# Patient Record
Sex: Female | Born: 1935 | Race: White | Hispanic: No | Smoking: Never smoker
Health system: Southern US, Community
[De-identification: ages and names within clinical notes are randomized; demographics above are authoritative.]

## PROBLEM LIST (undated history)

## (undated) DIAGNOSIS — F0390 Unspecified dementia without behavioral disturbance: Secondary | ICD-10-CM

## (undated) DIAGNOSIS — D510 Vitamin B12 deficiency anemia due to intrinsic factor deficiency: Secondary | ICD-10-CM

## (undated) DIAGNOSIS — N189 Chronic kidney disease, unspecified: Secondary | ICD-10-CM

## (undated) DIAGNOSIS — I10 Essential (primary) hypertension: Secondary | ICD-10-CM

## (undated) DIAGNOSIS — G4733 Obstructive sleep apnea (adult) (pediatric): Secondary | ICD-10-CM

## (undated) DIAGNOSIS — M1990 Unspecified osteoarthritis, unspecified site: Secondary | ICD-10-CM

## (undated) DIAGNOSIS — E7800 Pure hypercholesterolemia, unspecified: Secondary | ICD-10-CM

## (undated) HISTORY — PX: OTHER SURGICAL HISTORY: SHX169

---

## 2005-08-24 ENCOUNTER — Emergency Department: Payer: Self-pay | Admitting: General Practice

## 2005-10-28 ENCOUNTER — Ambulatory Visit: Payer: Self-pay | Admitting: Internal Medicine

## 2006-03-03 ENCOUNTER — Ambulatory Visit: Payer: Self-pay | Admitting: Unknown Physician Specialty

## 2006-04-07 ENCOUNTER — Ambulatory Visit: Payer: Self-pay | Admitting: Pain Medicine

## 2006-04-11 ENCOUNTER — Ambulatory Visit: Payer: Self-pay | Admitting: Pain Medicine

## 2006-05-24 ENCOUNTER — Ambulatory Visit: Payer: Self-pay | Admitting: Pain Medicine

## 2006-06-08 ENCOUNTER — Ambulatory Visit: Payer: Self-pay | Admitting: Pain Medicine

## 2006-08-16 ENCOUNTER — Ambulatory Visit: Payer: Self-pay | Admitting: Pain Medicine

## 2006-08-31 ENCOUNTER — Ambulatory Visit: Payer: Self-pay | Admitting: Pain Medicine

## 2006-10-04 ENCOUNTER — Ambulatory Visit: Payer: Self-pay | Admitting: Pain Medicine

## 2006-10-17 ENCOUNTER — Ambulatory Visit: Payer: Self-pay | Admitting: Pain Medicine

## 2011-09-07 ENCOUNTER — Ambulatory Visit: Payer: Self-pay | Admitting: Internal Medicine

## 2013-10-08 ENCOUNTER — Emergency Department: Payer: Self-pay | Admitting: Emergency Medicine

## 2013-10-08 LAB — CBC WITH DIFFERENTIAL/PLATELET
BASOS PCT: 0.4 %
Basophil #: 0 10*3/uL (ref 0.0–0.1)
EOS PCT: 1 %
Eosinophil #: 0.1 10*3/uL (ref 0.0–0.7)
HCT: 34.8 % — AB (ref 35.0–47.0)
HGB: 11.8 g/dL — ABNORMAL LOW (ref 12.0–16.0)
LYMPHS PCT: 14.6 %
Lymphocyte #: 1.2 10*3/uL (ref 1.0–3.6)
MCH: 31.6 pg (ref 26.0–34.0)
MCHC: 33.9 g/dL (ref 32.0–36.0)
MCV: 93 fL (ref 80–100)
Monocyte #: 0.5 x10 3/mm (ref 0.2–0.9)
Monocyte %: 6.4 %
NEUTROS ABS: 6.6 10*3/uL — AB (ref 1.4–6.5)
NEUTROS PCT: 77.6 %
PLATELETS: 173 10*3/uL (ref 150–440)
RBC: 3.73 10*6/uL — ABNORMAL LOW (ref 3.80–5.20)
RDW: 13 % (ref 11.5–14.5)
WBC: 8.5 10*3/uL (ref 3.6–11.0)

## 2013-10-08 LAB — BASIC METABOLIC PANEL
Anion Gap: 7 (ref 7–16)
BUN: 31 mg/dL — AB (ref 7–18)
Calcium, Total: 9.5 mg/dL (ref 8.5–10.1)
Chloride: 103 mmol/L (ref 98–107)
Co2: 26 mmol/L (ref 21–32)
Creatinine: 1.61 mg/dL — ABNORMAL HIGH (ref 0.60–1.30)
EGFR (African American): 32 — ABNORMAL LOW
EGFR (Non-African Amer.): 27 — ABNORMAL LOW
GLUCOSE: 128 mg/dL — AB (ref 65–99)
OSMOLALITY: 280 (ref 275–301)
Potassium: 4.4 mmol/L (ref 3.5–5.1)
SODIUM: 136 mmol/L (ref 136–145)

## 2013-10-08 LAB — TROPONIN I

## 2014-04-24 ENCOUNTER — Inpatient Hospital Stay: Payer: Self-pay | Admitting: Internal Medicine

## 2014-04-24 LAB — COMPREHENSIVE METABOLIC PANEL
AST: 54 U/L — AB (ref 15–37)
Albumin: 2.5 g/dL — ABNORMAL LOW (ref 3.4–5.0)
Alkaline Phosphatase: 145 U/L — ABNORMAL HIGH
Anion Gap: 12 (ref 7–16)
BILIRUBIN TOTAL: 0.4 mg/dL (ref 0.2–1.0)
BUN: 64 mg/dL — ABNORMAL HIGH (ref 7–18)
CHLORIDE: 104 mmol/L (ref 98–107)
CO2: 27 mmol/L (ref 21–32)
CREATININE: 1.91 mg/dL — AB (ref 0.60–1.30)
Calcium, Total: 8.7 mg/dL (ref 8.5–10.1)
EGFR (Non-African Amer.): 22 — ABNORMAL LOW
GFR CALC AF AMER: 26 — AB
Glucose: 142 mg/dL — ABNORMAL HIGH (ref 65–99)
Osmolality: 306 (ref 275–301)
Potassium: 3.3 mmol/L — ABNORMAL LOW (ref 3.5–5.1)
SGPT (ALT): 33 U/L
Sodium: 143 mmol/L (ref 136–145)
Total Protein: 6.8 g/dL (ref 6.4–8.2)

## 2014-04-24 LAB — URINALYSIS, COMPLETE
BACTERIA: NONE SEEN
BILIRUBIN, UR: NEGATIVE
Blood: NEGATIVE
Glucose,UR: NEGATIVE mg/dL (ref 0–75)
Hyaline Cast: 9
Ketone: NEGATIVE
LEUKOCYTE ESTERASE: NEGATIVE
Nitrite: NEGATIVE
Ph: 5 (ref 4.5–8.0)
Protein: 30
Specific Gravity: 1.02 (ref 1.003–1.030)
Squamous Epithelial: <1
WBC UR: 1 /HPF (ref 0–5)

## 2014-04-24 LAB — PROTIME-INR
INR: 1.1
Prothrombin Time: 14.4 secs (ref 11.5–14.7)

## 2014-04-24 LAB — CBC
HCT: 32.1 % — ABNORMAL LOW (ref 35.0–47.0)
HGB: 10.4 g/dL — ABNORMAL LOW (ref 12.0–16.0)
MCH: 30.2 pg (ref 26.0–34.0)
MCHC: 32.4 g/dL (ref 32.0–36.0)
MCV: 93 fL (ref 80–100)
PLATELETS: 239 10*3/uL (ref 150–440)
RBC: 3.44 10*6/uL — AB (ref 3.80–5.20)
RDW: 13.4 % (ref 11.5–14.5)
WBC: 12.8 10*3/uL — AB (ref 3.6–11.0)

## 2014-04-24 LAB — APTT: Activated PTT: 30.4 secs (ref 23.6–35.9)

## 2014-04-24 LAB — TROPONIN I: Troponin-I: <0.02 ng/mL

## 2014-04-24 LAB — MAGNESIUM: Magnesium: 1.7 mg/dL — ABNORMAL LOW

## 2014-04-25 LAB — LIPID PANEL
Cholesterol: 112 mg/dL (ref 0–200)
HDL Cholesterol: 17 mg/dL — ABNORMAL LOW (ref 40–60)
LDL CHOLESTEROL, CALC: 63 mg/dL (ref 0–100)
Triglycerides: 160 mg/dL (ref 0–200)
VLDL Cholesterol, Calc: 32 mg/dL (ref 5–40)

## 2014-04-25 LAB — CBC WITH DIFFERENTIAL/PLATELET
Basophil #: 0 10*3/uL (ref 0.0–0.1)
Basophil %: 0.3 %
EOS ABS: 0.2 10*3/uL (ref 0.0–0.7)
Eosinophil %: 2 %
HCT: 29.6 % — AB (ref 35.0–47.0)
HGB: 9.3 g/dL — ABNORMAL LOW (ref 12.0–16.0)
LYMPHS ABS: 1.5 10*3/uL (ref 1.0–3.6)
Lymphocyte %: 12.5 %
MCH: 29.1 pg (ref 26.0–34.0)
MCHC: 31.3 g/dL — AB (ref 32.0–36.0)
MCV: 93 fL (ref 80–100)
MONO ABS: 0.8 x10 3/mm (ref 0.2–0.9)
MONOS PCT: 7 %
Neutrophil #: 9.5 10*3/uL — ABNORMAL HIGH (ref 1.4–6.5)
Neutrophil %: 78.2 %
PLATELETS: 203 10*3/uL (ref 150–440)
RBC: 3.19 10*6/uL — ABNORMAL LOW (ref 3.80–5.20)
RDW: 13.2 % (ref 11.5–14.5)
WBC: 12.1 10*3/uL — AB (ref 3.6–11.0)

## 2014-04-25 LAB — BASIC METABOLIC PANEL
Anion Gap: 11 (ref 7–16)
BUN: 50 mg/dL — AB (ref 7–18)
CHLORIDE: 108 mmol/L — AB (ref 98–107)
CO2: 22 mmol/L (ref 21–32)
Calcium, Total: 8.1 mg/dL — ABNORMAL LOW (ref 8.5–10.1)
Creatinine: 1.63 mg/dL — ABNORMAL HIGH (ref 0.60–1.30)
EGFR (Non-African Amer.): 27 — ABNORMAL LOW
GFR CALC AF AMER: 31 — AB
Glucose: 105 mg/dL — ABNORMAL HIGH (ref 65–99)
OSMOLALITY: 295 (ref 275–301)
Potassium: 3.4 mmol/L — ABNORMAL LOW (ref 3.5–5.1)
SODIUM: 141 mmol/L (ref 136–145)

## 2014-04-25 LAB — MAGNESIUM: Magnesium: 1.4 mg/dL — ABNORMAL LOW

## 2014-04-25 LAB — TSH: THYROID STIMULATING HORM: 0.397 u[IU]/mL — AB

## 2014-04-26 LAB — BASIC METABOLIC PANEL
Anion Gap: 7 (ref 7–16)
BUN: 27 mg/dL — ABNORMAL HIGH (ref 7–18)
CALCIUM: 8.3 mg/dL — AB (ref 8.5–10.1)
CO2: 26 mmol/L (ref 21–32)
Chloride: 108 mmol/L — ABNORMAL HIGH (ref 98–107)
Creatinine: 1.24 mg/dL (ref 0.60–1.30)
EGFR (African American): 43 — ABNORMAL LOW
GFR CALC NON AF AMER: 37 — AB
Glucose: 118 mg/dL — ABNORMAL HIGH (ref 65–99)
Osmolality: 287 (ref 275–301)
Potassium: 3.7 mmol/L (ref 3.5–5.1)
Sodium: 141 mmol/L (ref 136–145)

## 2014-04-26 LAB — CBC WITH DIFFERENTIAL/PLATELET
BASOS ABS: 0 10*3/uL (ref 0.0–0.1)
Basophil %: 0.4 %
Eosinophil #: 0.2 10*3/uL (ref 0.0–0.7)
Eosinophil %: 2.3 %
HCT: 30 % — ABNORMAL LOW (ref 35.0–47.0)
HGB: 9.9 g/dL — AB (ref 12.0–16.0)
Lymphocyte #: 1.3 10*3/uL (ref 1.0–3.6)
Lymphocyte %: 12.6 %
MCH: 30.5 pg (ref 26.0–34.0)
MCHC: 33.1 g/dL (ref 32.0–36.0)
MCV: 92 fL (ref 80–100)
MONO ABS: 0.8 x10 3/mm (ref 0.2–0.9)
MONOS PCT: 7.8 %
NEUTROS ABS: 7.9 10*3/uL — AB (ref 1.4–6.5)
NEUTROS PCT: 76.9 %
Platelet: 186 10*3/uL (ref 150–440)
RBC: 3.25 10*6/uL — AB (ref 3.80–5.20)
RDW: 13.3 % (ref 11.5–14.5)
WBC: 10.3 10*3/uL (ref 3.6–11.0)

## 2014-04-27 LAB — BASIC METABOLIC PANEL
Anion Gap: 10 (ref 7–16)
BUN: 15 mg/dL (ref 7–18)
CHLORIDE: 105 mmol/L (ref 98–107)
CREATININE: 1.09 mg/dL (ref 0.60–1.30)
Calcium, Total: 8.2 mg/dL — ABNORMAL LOW (ref 8.5–10.1)
Co2: 26 mmol/L (ref 21–32)
EGFR (Non-African Amer.): 44 — ABNORMAL LOW
GFR CALC AF AMER: 51 — AB
Glucose: 108 mg/dL — ABNORMAL HIGH (ref 65–99)
OSMOLALITY: 283 (ref 275–301)
Potassium: 3.6 mmol/L (ref 3.5–5.1)
SODIUM: 141 mmol/L (ref 136–145)

## 2014-04-28 LAB — BASIC METABOLIC PANEL
ANION GAP: 7 (ref 7–16)
BUN: 12 mg/dL (ref 7–18)
CHLORIDE: 108 mmol/L — AB (ref 98–107)
CO2: 28 mmol/L (ref 21–32)
Calcium, Total: 8.1 mg/dL — ABNORMAL LOW (ref 8.5–10.1)
Creatinine: 1.12 mg/dL (ref 0.60–1.30)
EGFR (Non-African Amer.): 42 — ABNORMAL LOW
GFR CALC AF AMER: 49 — AB
GLUCOSE: 102 mg/dL — AB (ref 65–99)
Osmolality: 285 (ref 275–301)
Potassium: 3.9 mmol/L (ref 3.5–5.1)
Sodium: 143 mmol/L (ref 136–145)

## 2014-04-29 LAB — CULTURE, BLOOD (SINGLE)

## 2014-07-01 ENCOUNTER — Observation Stay: Payer: Self-pay | Admitting: Internal Medicine

## 2014-07-01 LAB — CBC WITH DIFFERENTIAL/PLATELET
Basophil #: 0.1 10*3/uL (ref 0.0–0.1)
Basophil %: 0.7 %
EOS PCT: 2.2 %
Eosinophil #: 0.2 10*3/uL (ref 0.0–0.7)
HCT: 31 % — ABNORMAL LOW (ref 35.0–47.0)
HGB: 10 g/dL — ABNORMAL LOW (ref 12.0–16.0)
Lymphocyte #: 1.8 10*3/uL (ref 1.0–3.6)
Lymphocyte %: 23.4 %
MCH: 28.9 pg (ref 26.0–34.0)
MCHC: 32.1 g/dL (ref 32.0–36.0)
MCV: 90 fL (ref 80–100)
MONOS PCT: 8 %
Monocyte #: 0.6 x10 3/mm (ref 0.2–0.9)
NEUTROS ABS: 5 10*3/uL (ref 1.4–6.5)
Neutrophil %: 65.7 %
PLATELETS: 211 10*3/uL (ref 150–440)
RBC: 3.45 10*6/uL — AB (ref 3.80–5.20)
RDW: 13.3 % (ref 11.5–14.5)
WBC: 7.5 10*3/uL (ref 3.6–11.0)

## 2014-07-01 LAB — URINALYSIS, COMPLETE
BILIRUBIN, UR: NEGATIVE
Glucose,UR: NEGATIVE mg/dL (ref 0–75)
Ketone: NEGATIVE
Nitrite: NEGATIVE
PH: 6 (ref 4.5–8.0)
Protein: NEGATIVE
RBC,UR: 105 /HPF (ref 0–5)
Specific Gravity: 1.013 (ref 1.003–1.030)
Squamous Epithelial: 11

## 2014-07-01 LAB — TROPONIN I
Troponin-I: 0.07 ng/mL — ABNORMAL HIGH
Troponin-I: 0.07 ng/mL — ABNORMAL HIGH
Troponin-I: 0.08 ng/mL — ABNORMAL HIGH

## 2014-07-01 LAB — CK-MB
CK-MB: 2.8 ng/mL (ref 0.5–3.6)
CK-MB: 2.9 ng/mL (ref 0.5–3.6)
CK-MB: 2.9 ng/mL (ref 0.5–3.6)

## 2014-07-01 LAB — BASIC METABOLIC PANEL
Anion Gap: 6 — ABNORMAL LOW (ref 7–16)
BUN: 24 mg/dL — ABNORMAL HIGH (ref 7–18)
CO2: 27 mmol/L (ref 21–32)
Calcium, Total: 8.6 mg/dL (ref 8.5–10.1)
Chloride: 107 mmol/L (ref 98–107)
Creatinine: 1.69 mg/dL — ABNORMAL HIGH (ref 0.60–1.30)
EGFR (Non-African Amer.): 30 — ABNORMAL LOW
GFR CALC AF AMER: 36 — AB
Glucose: 116 mg/dL — ABNORMAL HIGH (ref 65–99)
Osmolality: 284 (ref 275–301)
Potassium: 4.3 mmol/L (ref 3.5–5.1)
Sodium: 140 mmol/L (ref 136–145)

## 2014-07-02 LAB — BASIC METABOLIC PANEL
Anion Gap: 10 (ref 7–16)
BUN: 21 mg/dL — ABNORMAL HIGH (ref 7–18)
CHLORIDE: 110 mmol/L — AB (ref 98–107)
Calcium, Total: 8.2 mg/dL — ABNORMAL LOW (ref 8.5–10.1)
Co2: 25 mmol/L (ref 21–32)
Creatinine: 1.43 mg/dL — ABNORMAL HIGH (ref 0.60–1.30)
EGFR (African American): 44 — ABNORMAL LOW
GFR CALC NON AF AMER: 36 — AB
Glucose: 90 mg/dL (ref 65–99)
Osmolality: 291 (ref 275–301)
Potassium: 3.6 mmol/L (ref 3.5–5.1)
Sodium: 145 mmol/L (ref 136–145)

## 2014-07-02 LAB — CBC WITH DIFFERENTIAL/PLATELET
BASOS ABS: 0 10*3/uL (ref 0.0–0.1)
Basophil %: 0.8 %
Eosinophil #: 0.1 10*3/uL (ref 0.0–0.7)
Eosinophil %: 3 %
HCT: 27.1 % — ABNORMAL LOW (ref 35.0–47.0)
HGB: 9 g/dL — ABNORMAL LOW (ref 12.0–16.0)
Lymphocyte #: 1.7 10*3/uL (ref 1.0–3.6)
Lymphocyte %: 35.3 %
MCH: 29.2 pg (ref 26.0–34.0)
MCHC: 33 g/dL (ref 32.0–36.0)
MCV: 89 fL (ref 80–100)
MONO ABS: 0.5 x10 3/mm (ref 0.2–0.9)
Monocyte %: 10.9 %
NEUTROS ABS: 2.5 10*3/uL (ref 1.4–6.5)
Neutrophil %: 50 %
Platelet: 165 10*3/uL (ref 150–440)
RBC: 3.06 10*6/uL — ABNORMAL LOW (ref 3.80–5.20)
RDW: 13.1 % (ref 11.5–14.5)
WBC: 4.9 10*3/uL (ref 3.6–11.0)

## 2014-07-02 LAB — TSH: Thyroid Stimulating Horm: 3.99 u[IU]/mL

## 2014-07-03 LAB — BASIC METABOLIC PANEL
Anion Gap: 9 (ref 7–16)
BUN: 16 mg/dL (ref 7–18)
CALCIUM: 8.2 mg/dL — AB (ref 8.5–10.1)
CHLORIDE: 110 mmol/L — AB (ref 98–107)
CO2: 26 mmol/L (ref 21–32)
CREATININE: 1.35 mg/dL — AB (ref 0.60–1.30)
EGFR (African American): 47 — ABNORMAL LOW
EGFR (Non-African Amer.): 39 — ABNORMAL LOW
GLUCOSE: 96 mg/dL (ref 65–99)
Osmolality: 290 (ref 275–301)
Potassium: 3.7 mmol/L (ref 3.5–5.1)
Sodium: 145 mmol/L (ref 136–145)

## 2014-07-03 LAB — CBC WITH DIFFERENTIAL/PLATELET
Basophil #: 0.1 10*3/uL (ref 0.0–0.1)
Basophil %: 1.2 %
Eosinophil #: 0.2 10*3/uL (ref 0.0–0.7)
Eosinophil %: 3.6 %
HCT: 28.6 % — ABNORMAL LOW (ref 35.0–47.0)
HGB: 9.3 g/dL — AB (ref 12.0–16.0)
LYMPHS ABS: 1.4 10*3/uL (ref 1.0–3.6)
LYMPHS PCT: 30 %
MCH: 28.8 pg (ref 26.0–34.0)
MCHC: 32.7 g/dL (ref 32.0–36.0)
MCV: 88 fL (ref 80–100)
MONO ABS: 0.5 x10 3/mm (ref 0.2–0.9)
Monocyte %: 11 %
Neutrophil #: 2.6 10*3/uL (ref 1.4–6.5)
Neutrophil %: 54.2 %
Platelet: 156 10*3/uL (ref 150–440)
RBC: 3.25 10*6/uL — AB (ref 3.80–5.20)
RDW: 13 % (ref 11.5–14.5)
WBC: 4.8 10*3/uL (ref 3.6–11.0)

## 2014-07-03 LAB — URINE CULTURE

## 2014-10-10 DIAGNOSIS — I10 Essential (primary) hypertension: Secondary | ICD-10-CM

## 2014-10-10 DIAGNOSIS — M150 Primary generalized (osteo)arthritis: Secondary | ICD-10-CM

## 2014-10-10 DIAGNOSIS — G4733 Obstructive sleep apnea (adult) (pediatric): Secondary | ICD-10-CM

## 2014-10-10 DIAGNOSIS — N183 Chronic kidney disease, stage 3 (moderate): Secondary | ICD-10-CM

## 2014-10-10 DIAGNOSIS — D510 Vitamin B12 deficiency anemia due to intrinsic factor deficiency: Secondary | ICD-10-CM

## 2014-10-10 DIAGNOSIS — F0150 Vascular dementia without behavioral disturbance: Secondary | ICD-10-CM

## 2014-11-09 ENCOUNTER — Emergency Department: Payer: Self-pay | Admitting: Emergency Medicine

## 2014-11-18 DIAGNOSIS — M159 Polyosteoarthritis, unspecified: Secondary | ICD-10-CM | POA: Diagnosis not present

## 2014-11-18 DIAGNOSIS — I10 Essential (primary) hypertension: Secondary | ICD-10-CM | POA: Diagnosis not present

## 2014-11-18 DIAGNOSIS — F0150 Vascular dementia without behavioral disturbance: Secondary | ICD-10-CM | POA: Diagnosis not present

## 2014-11-18 DIAGNOSIS — N183 Chronic kidney disease, stage 3 (moderate): Secondary | ICD-10-CM | POA: Diagnosis not present

## 2014-12-13 DIAGNOSIS — F0150 Vascular dementia without behavioral disturbance: Secondary | ICD-10-CM

## 2014-12-13 DIAGNOSIS — N183 Chronic kidney disease, stage 3 (moderate): Secondary | ICD-10-CM

## 2014-12-13 DIAGNOSIS — M1990 Unspecified osteoarthritis, unspecified site: Secondary | ICD-10-CM

## 2014-12-13 DIAGNOSIS — G4733 Obstructive sleep apnea (adult) (pediatric): Secondary | ICD-10-CM

## 2014-12-13 DIAGNOSIS — I10 Essential (primary) hypertension: Secondary | ICD-10-CM

## 2014-12-21 NOTE — Consult Note (Signed)
PATIENT NAME:  Melissa Cooper, Melissa Cooper MR#:  [id number] DATE OF BIRTH:  [birth year]  DATE OF CONSULTATION:  07/01/2014  REFERRING PHYSICIAN: Dr. Paris LoreWiley  CONSULTING PHYSICIAN:  Lamar BlinksBruce J. Rielyn Krupinski, MD  REASON FOR CONSULTATION: Syncope with 5 minutes of consciousness, chronic kidney disease and elevated troponin.   CHIEF COMPLAINT: The patient is [id number] and has no recollection.   HISTORY OF PRESENT ILLNESS: This is a 81 year old female with known chronic kidney disease stage 3 with a GFR of 30% and has had an episode of syncope earlier this year with an episode of bronchitis and dehydration. At that time, she had rehydration and had significant improvements of her symptoms. She now has a syncopal episode where she sat back down and was out for approximately 5 minutes with no evidence of significant convulsions or other problems. Her blood pressure has been normal and she has had essential hypertension in the past for which she has received medications but no diuretics. Currently, she feels fine, but does have an EKG showing normal sinus rhythm, an elevated troponin of 0.07 consistent with demand ischemia without evidence of acute coronary syndrome. The remainder review of systems cannot be assessed due to the patient is elderly and cannot communicate well.   PAST MEDICAL HISTORY:  1.  Recurrent syncope.  2.  Chronic kidney disease stage 3.  3.  Elevated troponin. 4.  Essential hypertension.   FAMILY HISTORY: No family members with early onset of cardiovascular disease or hypertension.   SOCIAL HISTORY: She currently denies alcohol or tobacco use.   ALLERGIES: AS LISTED.   MEDICATIONS: As listed.   PHYSICAL EXAMINATION:  VITAL SIGNS: Blood pressure is 110/62 bilaterally. Heart rate is 70, upright, reclining and regular.  GENERAL: She is a well-appearing elderly female in no acute distress.  HEENT: No icterus, thyromegaly, ulcers, hemorrhage or xanthelasma.  CARDIOVASCULAR: Regular rate and rhythm. Normal  S1, soft S2, with a 2/6 right upper sternal border murmur, nonradiating. PMI is diffuse. Carotid upstroke normal without bruit. Jugular venous pressure normal.  LUNGS: Have a few basilar crackles with normal respirations.  ABDOMEN: Soft, nontender without hepatosplenomegaly or masses. Abdominal aorta is normal size without bruit.  EXTREMITIES: Show 2+ radial, femoral, dorsal pedal pulses with no lower extremity edema, cyanosis, clubbing or ulcers.  NEUROLOGIC: She is not oriented to time, place and person at this time.   ASSESSMENT: A 81 year old female with recurrent syncope, chronic kidney disease stage 3, essential hypertension and elevated troponin needing further treatment options with aortic valve disease.   RECOMMENDATIONS:  1.  Consider echocardiogram for valvular heart disease as a cause of syncope.  2.  Continue hydration for chronic kidney disease stage 3. 3.  No further intervention of elevated troponin, most consistent with demand ischemia and/or chronic kidney disease and not likely acute coronary syndrome.  4.  Ambulation and follow telemetry for other significant causes of issues listed above.    ____________________________ Lamar BlinksBruce J. Allisha Harter, MD bjk:TT D: 07/01/2014 16:58:00 ET T: 07/01/2014 20:11:41 ET JOB#: [id number]  cc: Lamar BlinksBruce J. Shawneequa Baldridge, MD, <Dictator> Lamar BlinksBRUCE J Olivia Pavelko MD ELECTRONICALLY SIGNED 07/05/2014 7:51

## 2014-12-21 NOTE — Discharge Summary (Signed)
PATIENT NAME:  Melissa Cooper, Emmalea L MR#:  [id number] DATE OF BIRTH:  [birth year]  DATE OF ADMISSION:  04/24/2014 DATE OF DISCHARGE:  04/29/2014  DISCHARGE DIAGNOSES:  1.  Acute renal failure secondary to prerenal state.  2.  Hypotension from dehydration contributing to above.  3.  Chronic hypertension with accelerated hypertension, off of her medications, now improved.  4.  Wheezing, from sinusitis.  5.  Sinusitis, now treated.  6.  Weakness secondary to elderly age an acute illness as noted above.   DISCHARGE MEDICATIONS: Per [facility] reconciliation [facility].  Briefly she will be on her home medications, has finished her antibiotics. The patient will need heparin or Lovenox subcutaneously until she is ambulatory, and the addition of Advair 1 puff  b.i.d. for at least 2 weeks or until discharge home.  HISTORY AND PHYSICAL: Please see detailed history and physical done on admission.   HOSPITAL COURSE: The patient was admitted, very weak, hypotensive with an elevated BUN and creatinine at 64 at 1.91, respectively. White blood cell count was elevated, troponin as well. She had sinusitis seen on initial head CT done for her confusion, which did clear during the hospitalization, as well. She improved as far as her white count is concerned while here, as well as her creatinine. By August 30 her creatinine was down to 1.12 and her white count was 10,300 by August 28, which is the last one that was checked. It was felt by me, the patient and her family, as well as physical therapy, that she needed a period of time in rehabilitation in order to get her strength back, be able to be at home; she lives alone. That is being arranged and should be done by later today.   TIME SPENT: Approximately 35 minutes to do all discharge tasks today.     ____________________________ Marya AmslerMarshall W. Dareen PianoAnderson, MD mwa:lt D: 04/29/2014 07:36:05 ET T: 04/29/2014 08:01:55 ET JOB#: [id number]  cc: Marya AmslerMarshall W. Dareen PianoAnderson, MD,  <Dictator> Lauro RegulusMARSHALL W ANDERSON MD ELECTRONICALLY SIGNED 05/02/2014 7:44

## 2014-12-21 NOTE — H&P (Signed)
PATIENT NAME:  Melissa Cooper, Melissa Cooper MR#:  [id number] DATE OF BIRTH:  Jan 02, 1918  DATE OF ADMISSION:  04/24/2014  PRIMARY CARE PHYSICIAN: Dr. Einar CrowMarshall Anderson.   CHIEF COMPLAINT: Near syncope, almost passed out, and generalized weakness.   HISTORY OF PRESENT ILLNESS: A 81 year old Caucasian female with a history of hypertension, arthritis, osteoporosis, was sent from home due to and generalized weakness today. The patient is alert, awake, oriented, in no acute distress. The patient complains of generalized weakness and almost passed out today, but she denies any loss of consciousness, seizure, or incontinence. The patient denies any headache or dizziness. Denies any fever or chills, but the patient has some cough to clear her throat. No sputum or wheezing. The patient denies any chest pain, palpitations, orthopnea, nocturnal dyspnea. No leg edema. The patient's blood pressure was low at 80s, was treated with normal saline bolus, then increased to 98/49.   PAST MEDICAL HISTORY: Hypertension, arthritis, osteoporosis.   SOCIAL HISTORY: Living alone. No smoking or drinking or illicit drugs.   FAMILY HISTORY: No family history of hypertension, diabetes, heart attack, or stroke. Siblings pretty healthy, more than 79 years old.    PAST SURGICAL HISTORY: None.   ALLERGIES: None.   HOME MEDICATIONS: Vitamin B12 at 1000 mcg p.o. daily, Tylenol 325 mg 2 tablets every 4 hours p.r.n., Ocuvite antioxidant vitamins in the manner of oral capsule 1 cap once a day, labetalol 300 mg p.o. b.i.d., HCTZ lisinopril 25 mg/20 mg p.o. daily, felodipine 5 mg p.o. daily, calcium carbonate 1 tablet once a day, aspirin 81 mg p.o. daily.    REVIEW OF SYSTEMS: CONSTITUTIONAL: The patient denies any fever or chills. No headache or dizziness, but has generalized weakness.  EYES: No double vision or blurred vision. ENT: No postnasal drip, slurred speech, or dysphagia.  CARDIOVASCULAR: No chest pain, palpitation, orthopnea,  nocturnal dyspnea. No leg edema.  PULMONARY: Positive for cough. No sputum, shortness of breath, or wheezing. No hemoptysis.  GASTROINTESTINAL: No abdominal pain, nausea, vomiting, diarrhea. No melena or bloody stool.  GENITOURINARY: No dysuria, hematuria, or incontinence.  SKIN: No rash or jaundice.  NEUROLOGIC: Near syncope. No loss of consciousness or seizure.  ENDOCRINE: No polyuria, polydipsia, heat or cold intolerance.  HEMATOLOGY: No easy bleeding or bleeding.   PHYSICAL EXAMINATION: VITAL SIGNS: Temperature 97.8, blood pressure 101/49, pulse 68, respirations 24, O2 saturation 96% on room air.  GENERAL: The patient is alert, awake, oriented, in no acute distress.  HEENT: Pupils round, equal and reactive to light and accommodation. Moist oral mucosa. Clear oropharynx.  NECK: Supple. No JVD or carotid bruits are noted. No lymphadenopathy. No thyromegaly.  CARDIOVASCULAR: S1 and S2. Regular rate and rhythm. No murmurs or gallops.  PULMONARY: Bilateral air entry. No wheezing or rales. No use of accessory muscle to breathe.  ABDOMEN: Soft. No distention or tenderness. No organomegaly. Bowel sounds present.  EXTREMITIES: No edema, clubbing or cyanosis. No calf tenderness. Bilateral pedal pulses present.  SKIN: No rash or jaundice.  NEUROLOGIC: A and O x 3. No focal deficit. Power 5/5. Sensation intact.   LABORATORY DATA: Urinalysis is negative. Chest x-ray no acute findings. Stable and possible vascular congestion on the right paratracheal soft tissue prominence. CAT scan of head without contrast shows no acute intracranial abnormality, acute paranasal sinusitis. WBC 20.8, hemoglobin 10.4, platelets of 239,000. Glucose 142, BUN 64, creatinine 1.91, sodium 143, potassium 3.3, chloride 104, bicarbonate 27. Troponin less than 0.02, INR 1.1. EKG showed normal sinus rhythm at 73 BPM.  IMPRESSIONS: 1.  Hypotension.  2.  Acute renal failure.  3.  Systemic inflammatory response syndrome.  4.   Acute paranasal sinusitis.  5.  Anemia.  6.  Near syncope.  7.  Hypokalemia.  8.  History of hypertension.  9.  Arthritis.   PLAN OF TREATMENT: 1.  The patient will be admitted to the medical floor with telemonitor. The patient was treated with normal saline bolus in the ED, blood pressure increased about 98/50. We will continue normal saline IV 100 mL/h and closely monitor vital signs.  2.  For acute renal failure, we will hold the patient's home hypertension medication including HCTZ, lisinopril, labetalol, felodipine. Give IV fluid support, follow up BMP.  3.  For hypokalemia, give potassium and follow up magnesium and potassium level.  4.  For possible acute paranasal sinusitis, we will give Zithromax. The patient was treated with vancomycin in the ED, blood culture was sent. Follow up blood culture and CBC.  5.  Fall and aspiration precautions, physical therapy.  6.  I discussed the patient's condition and plan of treatment with the patient and the patient's 2 sons.   PATIENT CODE STATUS: DNR.   TIME SPENT: About 55 minutes.    ____________________________ Shaune Pollack, MD qc:at D: 04/24/2014 15:38:05 ET T: 04/24/2014 16:46:42 ET JOB#: [id number]  cc: Shaune Pollack, MD, <Dictator> Shaune Pollack MD ELECTRONICALLY SIGNED 04/24/2014 21:44

## 2014-12-21 NOTE — H&P (Signed)
PATIENT NAME:  Melissa BumpCLARK, Alisea L MR#:  [id number] DATE OF BIRTH:  November 08, 1917  DATE OF ADMISSION:  07/01/2014  PRIMARY CARE PHYSICIAN:  Marya AmslerMarshall W. Dareen PianoAnderson, MD  HISTORY OF PRESENT ILLNESS: The patient is a 81 year old Caucasian female with past medical history significant for history of admission with acute renal failure with creatinine level of 1.91 in August 2015, due to dehydration, history of hypotension during the same admission. The patient comes back to the hospital with very similar symptoms.  Apparently, she passed out today for approximately 5 minutes.  She woke up and she was having some difficulty breathing according to patient's son who was present during my interview.  However, patient denied any problems with chest pain or shortness of breath.  When she stood up, she was dizzy and she was sent to the Emergency Room for further evaluation.   In the Emergency Room, she was noted to be hypotensive with systolic blood pressure of 80/30. She was given 1 liter of IV fluids after which her blood pressure improved to 122/67.  Since her  kidney function was abnormal with creatinine level of 1.69, hospitalist services were contacted for admission for this patient for rehydration.   PAST MEDICAL HISTORY: Significant for history of admission for syncope with acute renal failure. Creatinine level of 1.91 in August 2015. Her creatinine improved to normal. Also episodes of hypotension during the same admission. History of chronic hypertension, accelerated hypertension, wheezing, sinusitis, and weakness on the same admission in August. The patient was just rehydrated. No other workup was entertained. Past medical history is also significant for osteoporosis as well as arthritis.  Also, cataracts removed and also macular degeneration.  SOCIAL HISTORY: Lives alone. No smoking, drinking alcohol, or illicit drugs.   FAMILY HISTORY:  No family history of hypertension, diabetes, heart attack, or stroke. Siblings  are healthy, more than 79 years old.   PAST SURGICAL HISTORY: None.   ALLERGIES: None.   MEDICATIONS: According to medical records, the patient is on aspirin 81 mg p.o. daily, felodipine 5 mg p.o. daily, hydrochlorothiazide and lisinopril 25/20 oral once daily, labetalol 300 mg twice daily, vitamin B12, 1000 mcg p.o. daily.  REVIEW OF SYSTEMS:  CONSTITUTIONAL:  The patient denies fevers, chills, fatigue, weakness,  pains, weight loss or gain.  EYES: Denies any blurry vision, double vision, or glaucoma. Admits to having cataracts removed in the past. Also history of macular degeneration.  EARS, NOSE, AND THROAT: Denies any tinnitus, allergies, epistaxis, sinus pain, dentures, difficulty swallowing.  RESPIRATORY: Denies any cough, wheezes, asthma, COPD.  CARDIOVASCULAR: Denies chest pains, orthopnea, edema, arrhythmias. Admits to syncope GASTROINTESTINAL: Denies any nausea, vomiting, diarrhea, or constipation. Admits to having some hemorrhoidal bleeding intermittently, but nothing significantly, nothing recently.  GENITOURINARY: Denies dysuria, hematuria, frequency, incontinence. ENDOCRINOLOGY: Denies any polydipsia, nocturia, thyroid problems, heat or cold intolerance, or thirst.  HEMATOLOGIC: Denies anemia, easy bruising or bleeding, swollen glands.  SKIN: Denies acne, rash, lesions, or change in moles.  MUSCULOSKELETAL: Denies arthritis, cramps, or swelling. NEUROLOGIC: Denies numbness, epilepsy, or tremor.  PSYCHIATRIC: Denies anxiety, insomnia, or depression.  PHYSICAL EXAMINATION:  VITAL SIGNS: On arrival to the hospital the patient's temperature was 98.3, pulse was 67, respiratory rate was 15, blood pressure 81/38, saturation was 98% on room air. During my evaluation, the patient's temperature is the same, 98.3, pulse was 84, respirations were 18, blood pressure 116/60, saturation was 98% on room air. GENERAL:  This was a well-nourished Caucasian female in no significant distress, lying  on  the stretcher.  HEENT: Her pupils are equal, reactive to light. Extraocular muscles intact, no icterus or conjunctivitis. Has normal hearing. No pharyngeal erythema. Mucosa is moist.  NECK: No masses. Supple, nontender. Thyroid is not enlarged. No adenopathy. No JVD or carotid bruits bilaterally. Full range of motion.  LUNGS: Clear to auscultation in all fields. No rales, rhonchi, diminished breath sounds, or wheezing. No labored inspiration, increased effort, dullness to percussion, or overt respiratory distress.  CARDIOVASCULAR: S1, S2 appreciated. The rhythm is regular. PMI not lateralized. Chest is nontender to palpation. Normal  peripheral pulses.  No lower extremity edema, calf tenderness, or cyanosis was noted.  ABDOMEN: Soft, nontender. Bowel sounds are present. No hepatosplenomegaly or masses were noted.  RECTAL: Deferred.  MUSCLE STRENGTH: Able to move all extremities. No cyanosis, degenerative joint disease, or kyphosis. Gait was not tested.  SKIN: Did not reveal any rashes, lesions, erythema, nodularity, or induration. It was warm and dry to palpation, scaly and dry. LYMPHATIC: No adenopathy in the cervical region.  NEUROLOGIC: Cranial nerves grossly intact. Sensory is intact. No dysarthria or aphasia.  PSYCHIATRIC: The patient is alert, oriented to time, person, place, cooperative. Memory is somewhat impaired, but no significant confusion, agitation, or depression was noted.   EKG showed sinus rhythm at 66 beats per minute, minimal voltage criteria for LVH, maybe normal variant. No acute ST-T changes were noted.  LABORATORY DATA:  Done on admission revealed BUN and creatinine of 24 and 1.69. Glucose 116, otherwise BMP was unremarkable.  The patient's troponin was elevated at 0.07.  White blood cell count was normal at 7.5, hemoglobin was 10.0, platelet count was 211,000. Absolute neutrophil count is normal at 5.0. Urinalysis was yet to be performed.   RADIOLOGIC STUDIES: Chest x-ray,  portable single view, 07/01/2014, showed low lung volumes, cardiomegaly, but no definite active infiltrates. No overt failure as well.   ASSESSMENT AND PLAN: 1. Syncope. Admit patient to medical floor. Very likely patient's syncope is related to her hypotension and dehydration. Will continue intravenous fluids. Will hold hydrochlorothiazide. Will get echocardiogram done as well as carotid ultrasound, and we will check cardiac enzymes x 3 and orthostatic vital signs. 2. Acute renal failure. Will continue intravenous fluids. We will check urinalysis and urine cultures. If her urinalysis is seen to be abnormal, we will also continue intravenous fluids and we will check a creatinine in the morning.  3. Anemia. We will get guaiac done and follow with rehydration.  4. Elevated troponin. Will check cardiac enzymes x 3. We will not be able to initiate beta blockers unless patient's blood pressure is better controlled and no nitroglycerin due to hypotension at this time. Will get echocardiogram and cardiology consultation if echocardiogram is abnormal.   TIME SPENT ON THE PATIENT: 50 minutes.  ____________________________ Katharina Caper, MD rv:LT D: 07/01/2014 16:34:00 ET T: 07/01/2014 17:13:15 ET JOB#: [id number]  cc: Marya Amsler. Dareen Piano, MD  Katharina Caper MD ELECTRONICALLY SIGNED 08/06/2014 11:32

## 2014-12-23 DIAGNOSIS — J209 Acute bronchitis, unspecified: Secondary | ICD-10-CM | POA: Diagnosis not present

## 2014-12-25 DIAGNOSIS — B372 Candidiasis of skin and nail: Secondary | ICD-10-CM

## 2015-01-02 DIAGNOSIS — N183 Chronic kidney disease, stage 3 (moderate): Secondary | ICD-10-CM | POA: Diagnosis not present

## 2015-01-02 DIAGNOSIS — M150 Primary generalized (osteo)arthritis: Secondary | ICD-10-CM

## 2015-01-02 DIAGNOSIS — I10 Essential (primary) hypertension: Secondary | ICD-10-CM

## 2015-01-02 DIAGNOSIS — F0150 Vascular dementia without behavioral disturbance: Secondary | ICD-10-CM

## 2015-02-03 DIAGNOSIS — G4089 Other seizures: Secondary | ICD-10-CM

## 2015-02-05 ENCOUNTER — Ambulatory Visit: Payer: Self-pay | Admitting: Internal Medicine

## 2015-03-12 DIAGNOSIS — M1990 Unspecified osteoarthritis, unspecified site: Secondary | ICD-10-CM

## 2015-03-12 DIAGNOSIS — F0150 Vascular dementia without behavioral disturbance: Secondary | ICD-10-CM

## 2015-03-12 DIAGNOSIS — N143 Nephropathy induced by heavy metals: Secondary | ICD-10-CM

## 2015-03-12 DIAGNOSIS — E43 Unspecified severe protein-calorie malnutrition: Secondary | ICD-10-CM

## 2015-03-12 DIAGNOSIS — I10 Essential (primary) hypertension: Secondary | ICD-10-CM | POA: Diagnosis not present

## 2015-03-12 DIAGNOSIS — R569 Unspecified convulsions: Secondary | ICD-10-CM

## 2015-05-13 DIAGNOSIS — I10 Essential (primary) hypertension: Secondary | ICD-10-CM | POA: Diagnosis not present

## 2015-05-13 DIAGNOSIS — D631 Anemia in chronic kidney disease: Secondary | ICD-10-CM

## 2015-05-13 DIAGNOSIS — H159 Unspecified disorder of sclera: Secondary | ICD-10-CM | POA: Diagnosis not present

## 2015-05-13 DIAGNOSIS — N183 Chronic kidney disease, stage 3 (moderate): Secondary | ICD-10-CM | POA: Diagnosis not present

## 2015-07-18 DIAGNOSIS — M1990 Unspecified osteoarthritis, unspecified site: Secondary | ICD-10-CM

## 2015-07-18 DIAGNOSIS — D631 Anemia in chronic kidney disease: Secondary | ICD-10-CM

## 2015-07-18 DIAGNOSIS — N183 Chronic kidney disease, stage 3 (moderate): Secondary | ICD-10-CM

## 2015-07-18 DIAGNOSIS — F0150 Vascular dementia without behavioral disturbance: Secondary | ICD-10-CM

## 2015-07-18 DIAGNOSIS — G4733 Obstructive sleep apnea (adult) (pediatric): Secondary | ICD-10-CM

## 2015-07-18 DIAGNOSIS — I10 Essential (primary) hypertension: Secondary | ICD-10-CM | POA: Diagnosis not present

## 2015-08-28 DIAGNOSIS — E860 Dehydration: Secondary | ICD-10-CM | POA: Diagnosis not present

## 2015-08-28 DIAGNOSIS — K529 Noninfective gastroenteritis and colitis, unspecified: Secondary | ICD-10-CM | POA: Diagnosis not present

## 2015-08-29 ENCOUNTER — Encounter: Payer: Self-pay | Admitting: Emergency Medicine

## 2015-08-29 ENCOUNTER — Emergency Department: Payer: Medicare Other

## 2015-08-29 ENCOUNTER — Inpatient Hospital Stay
Admission: EM | Admit: 2015-08-29 | Discharge: 2015-09-05 | DRG: 872 | Disposition: A | Discharge status: Skilled Nursing Facility | Payer: Medicare Other | Attending: Internal Medicine | Admitting: Internal Medicine

## 2015-08-29 DIAGNOSIS — E46 Unspecified protein-calorie malnutrition: Secondary | ICD-10-CM | POA: Diagnosis present

## 2015-08-29 DIAGNOSIS — J189 Pneumonia, unspecified organism: Secondary | ICD-10-CM

## 2015-08-29 DIAGNOSIS — Z66 Do not resuscitate: Secondary | ICD-10-CM | POA: Diagnosis present

## 2015-08-29 DIAGNOSIS — R4702 Dysphasia: Secondary | ICD-10-CM | POA: Diagnosis present

## 2015-08-29 DIAGNOSIS — L89301 Pressure ulcer of unspecified buttock, stage 1: Secondary | ICD-10-CM | POA: Diagnosis present

## 2015-08-29 DIAGNOSIS — Z7401 Bed confinement status: Secondary | ICD-10-CM | POA: Diagnosis not present

## 2015-08-29 DIAGNOSIS — A414 Sepsis due to anaerobes: Secondary | ICD-10-CM | POA: Diagnosis not present

## 2015-08-29 DIAGNOSIS — A047 Enterocolitis due to Clostridium difficile: Secondary | ICD-10-CM | POA: Diagnosis present

## 2015-08-29 DIAGNOSIS — D510 Vitamin B12 deficiency anemia due to intrinsic factor deficiency: Secondary | ICD-10-CM | POA: Diagnosis present

## 2015-08-29 DIAGNOSIS — E785 Hyperlipidemia, unspecified: Secondary | ICD-10-CM | POA: Diagnosis present

## 2015-08-29 DIAGNOSIS — E872 Acidosis: Secondary | ICD-10-CM | POA: Diagnosis present

## 2015-08-29 DIAGNOSIS — L8990 Pressure ulcer of unspecified site, unspecified stage: Secondary | ICD-10-CM

## 2015-08-29 DIAGNOSIS — N183 Chronic kidney disease, stage 3 (moderate): Secondary | ICD-10-CM | POA: Diagnosis present

## 2015-08-29 DIAGNOSIS — D649 Anemia, unspecified: Secondary | ICD-10-CM | POA: Diagnosis present

## 2015-08-29 DIAGNOSIS — A419 Sepsis, unspecified organism: Secondary | ICD-10-CM

## 2015-08-29 DIAGNOSIS — E7800 Pure hypercholesterolemia, unspecified: Secondary | ICD-10-CM | POA: Diagnosis present

## 2015-08-29 DIAGNOSIS — R509 Fever, unspecified: Secondary | ICD-10-CM | POA: Diagnosis not present

## 2015-08-29 DIAGNOSIS — R062 Wheezing: Secondary | ICD-10-CM | POA: Diagnosis present

## 2015-08-29 DIAGNOSIS — R05 Cough: Secondary | ICD-10-CM | POA: Diagnosis present

## 2015-08-29 DIAGNOSIS — R197 Diarrhea, unspecified: Secondary | ICD-10-CM | POA: Diagnosis not present

## 2015-08-29 DIAGNOSIS — I129 Hypertensive chronic kidney disease with stage 1 through stage 4 chronic kidney disease, or unspecified chronic kidney disease: Secondary | ICD-10-CM | POA: Diagnosis present

## 2015-08-29 DIAGNOSIS — E860 Dehydration: Secondary | ICD-10-CM

## 2015-08-29 DIAGNOSIS — Z79899 Other long term (current) drug therapy: Secondary | ICD-10-CM

## 2015-08-29 DIAGNOSIS — G4733 Obstructive sleep apnea (adult) (pediatric): Secondary | ICD-10-CM | POA: Diagnosis present

## 2015-08-29 DIAGNOSIS — M1990 Unspecified osteoarthritis, unspecified site: Secondary | ICD-10-CM | POA: Diagnosis present

## 2015-08-29 DIAGNOSIS — A0472 Enterocolitis due to Clostridium difficile, not specified as recurrent: Secondary | ICD-10-CM

## 2015-08-29 DIAGNOSIS — F0390 Unspecified dementia without behavioral disturbance: Secondary | ICD-10-CM | POA: Diagnosis present

## 2015-08-29 DIAGNOSIS — Z6827 Body mass index (BMI) 27.0-27.9, adult: Secondary | ICD-10-CM

## 2015-08-29 DIAGNOSIS — R0600 Dyspnea, unspecified: Secondary | ICD-10-CM

## 2015-08-29 HISTORY — DX: Chronic kidney disease, unspecified: N18.9

## 2015-08-29 HISTORY — DX: Unspecified osteoarthritis, unspecified site: M19.90

## 2015-08-29 HISTORY — DX: Obstructive sleep apnea (adult) (pediatric): G47.33

## 2015-08-29 HISTORY — DX: Essential (primary) hypertension: I10

## 2015-08-29 HISTORY — DX: Pure hypercholesterolemia, unspecified: E78.00

## 2015-08-29 HISTORY — DX: Vitamin B12 deficiency anemia due to intrinsic factor deficiency: D51.0

## 2015-08-29 HISTORY — DX: Unspecified dementia, unspecified severity, without behavioral disturbance, psychotic disturbance, mood disturbance, and anxiety: F03.90

## 2015-08-29 LAB — COMPREHENSIVE METABOLIC PANEL
ALBUMIN: 2.9 g/dL — AB (ref 3.5–5.0)
ALK PHOS: 84 U/L (ref 38–126)
ALT: 8 U/L — AB (ref 14–54)
AST: 12 U/L — AB (ref 15–41)
Anion gap: 9 (ref 5–15)
BUN: 37 mg/dL — AB (ref 6–20)
CALCIUM: 8.4 mg/dL — AB (ref 8.9–10.3)
CO2: 24 mmol/L (ref 22–32)
CREATININE: 1.39 mg/dL — AB (ref 0.44–1.00)
Chloride: 102 mmol/L (ref 101–111)
GFR calc non Af Amer: 31 mL/min — ABNORMAL LOW (ref >60)
GFR, EST AFRICAN AMERICAN: 36 mL/min — AB (ref >60)
GLUCOSE: 133 mg/dL — AB (ref 65–99)
Potassium: 4 mmol/L (ref 3.5–5.1)
SODIUM: 135 mmol/L (ref 135–145)
Total Bilirubin: 0.5 mg/dL (ref 0.3–1.2)
Total Protein: 6 g/dL — ABNORMAL LOW (ref 6.5–8.1)

## 2015-08-29 LAB — GASTROINTESTINAL PANEL BY PCR, STOOL (REPLACES STOOL CULTURE)
ADENOVIRUS F40/41: NOT DETECTED
ASTROVIRUS: NOT DETECTED
CAMPYLOBACTER SPECIES: NOT DETECTED
CYCLOSPORA CAYETANENSIS: NOT DETECTED
Cryptosporidium: NOT DETECTED
E. coli O157: NOT DETECTED
ENTEROAGGREGATIVE E COLI (EAEC): NOT DETECTED
ENTEROPATHOGENIC E COLI (EPEC): NOT DETECTED
ENTEROTOXIGENIC E COLI (ETEC): NOT DETECTED
Entamoeba histolytica: NOT DETECTED
GIARDIA LAMBLIA: NOT DETECTED
Norovirus GI/GII: NOT DETECTED
PLESIMONAS SHIGELLOIDES: NOT DETECTED
ROTAVIRUS A: NOT DETECTED
SHIGA LIKE TOXIN PRODUCING E COLI (STEC): NOT DETECTED
Salmonella species: NOT DETECTED
Sapovirus (I, II, IV, and V): NOT DETECTED
Shigella/Enteroinvasive E coli (EIEC): NOT DETECTED
VIBRIO SPECIES: NOT DETECTED
Vibrio cholerae: NOT DETECTED
YERSINIA ENTEROCOLITICA: NOT DETECTED

## 2015-08-29 LAB — C DIFFICILE QUICK SCREEN W PCR REFLEX
C DIFFICILE (CDIFF) INTERP: POSITIVE
C Diff antigen: POSITIVE — AB
C Diff toxin: POSITIVE — AB

## 2015-08-29 LAB — CBC WITH DIFFERENTIAL/PLATELET
BASOS ABS: 0.1 10*3/uL (ref 0–0.1)
Basophils Relative: 0 %
EOS ABS: 0.1 10*3/uL (ref 0–0.7)
Eosinophils Relative: 0 %
HCT: 34.1 % — ABNORMAL LOW (ref 35.0–47.0)
HEMOGLOBIN: 10.5 g/dL — AB (ref 12.0–16.0)
LYMPHS ABS: 1.4 10*3/uL (ref 1.0–3.6)
Lymphocytes Relative: 4 %
MCH: 23.4 pg — AB (ref 26.0–34.0)
MCHC: 30.7 g/dL — ABNORMAL LOW (ref 32.0–36.0)
MCV: 76 fL — ABNORMAL LOW (ref 80.0–100.0)
Monocytes Absolute: 1.8 10*3/uL — ABNORMAL HIGH (ref 0.2–0.9)
Monocytes Relative: 5 %
NEUTROS PCT: 91 %
Neutro Abs: 31.1 10*3/uL — ABNORMAL HIGH (ref 1.4–6.5)
Platelets: 385 10*3/uL (ref 150–440)
RBC: 4.48 MIL/uL (ref 3.80–5.20)
RDW: 19.1 % — ABNORMAL HIGH (ref 11.5–14.5)
WBC: 34.4 10*3/uL — AB (ref 3.6–11.0)

## 2015-08-29 LAB — URINALYSIS COMPLETE WITH MICROSCOPIC (ARMC ONLY)
Bilirubin Urine: NEGATIVE
Glucose, UA: NEGATIVE mg/dL
HGB URINE DIPSTICK: NEGATIVE
Ketones, ur: NEGATIVE mg/dL
Leukocytes, UA: NEGATIVE
Nitrite: NEGATIVE
PH: 5 (ref 5.0–8.0)
Protein, ur: NEGATIVE mg/dL
SPECIFIC GRAVITY, URINE: 1.025 (ref 1.005–1.030)

## 2015-08-29 LAB — LIPASE, BLOOD: Lipase: 13 U/L (ref 11–51)

## 2015-08-29 MED ORDER — VITAMIN B-12 1000 MCG PO TABS
1000.0000 ug | ORAL_TABLET | Freq: Every day | ORAL | Status: DC
  Administered 2015-08-30 – 2015-09-05 (×7): 1000 ug via ORAL
  Filled 2015-08-29 (×7): qty 1

## 2015-08-29 MED ORDER — ASPIRIN 81 MG PO CHEW
81.0000 mg | CHEWABLE_TABLET | Freq: Every day | ORAL | Status: DC
  Administered 2015-08-29 – 2015-09-05 (×8): 81 mg via ORAL
  Filled 2015-08-29 (×8): qty 1

## 2015-08-29 MED ORDER — SODIUM CHLORIDE 0.9 % IV BOLUS (SEPSIS)
1000.0000 mL | Freq: Once | INTRAVENOUS | Status: AC
  Administered 2015-08-29: 1000 mL via INTRAVENOUS

## 2015-08-29 MED ORDER — SODIUM CHLORIDE 0.9 % IV SOLN
INTRAVENOUS | Status: DC
  Administered 2015-08-29: 19:00:00 via INTRAVENOUS

## 2015-08-29 MED ORDER — ONDANSETRON HCL 4 MG PO TABS
4.0000 mg | ORAL_TABLET | Freq: Four times a day (QID) | ORAL | Status: DC | PRN
Start: 2015-08-29 — End: 2015-09-05

## 2015-08-29 MED ORDER — ACETAMINOPHEN 650 MG RE SUPP
650.0000 mg | Freq: Four times a day (QID) | RECTAL | Status: DC | PRN
Start: 2015-08-29 — End: 2015-09-05

## 2015-08-29 MED ORDER — IOHEXOL 240 MG/ML SOLN: 25.0000 mL | Freq: Once | INTRAMUSCULAR | Status: DC | PRN

## 2015-08-29 MED ORDER — ONDANSETRON HCL 4 MG/2ML IJ SOLN: 4.0000 mg | Freq: Four times a day (QID) | INTRAMUSCULAR | Status: DC | PRN

## 2015-08-29 MED ORDER — VANCOMYCIN 50 MG/ML ORAL SOLUTION
125.0000 mg | Freq: Four times a day (QID) | ORAL | Status: DC
  Administered 2015-08-29 – 2015-08-30 (×2): 125 mg via ORAL
  Filled 2015-08-29 (×10): qty 2.5

## 2015-08-29 MED ORDER — ENOXAPARIN SODIUM 30 MG/0.3ML ~~LOC~~ SOLN
30.0000 mg | SUBCUTANEOUS | Status: DC
Start: 2015-08-29 — End: 2015-09-01
  Administered 2015-08-29 – 2015-08-31 (×3): 30 mg via SUBCUTANEOUS
  Filled 2015-08-29 (×3): qty 0.3

## 2015-08-29 MED ORDER — RISAQUAD PO CAPS
1.0000 | ORAL_CAPSULE | Freq: Two times a day (BID) | ORAL | Status: DC
Start: 2015-08-29 — End: 2015-09-05
  Administered 2015-08-29 – 2015-09-05 (×14): 1 via ORAL
  Filled 2015-08-29 (×14): qty 1

## 2015-08-29 MED ORDER — ACETAMINOPHEN 325 MG PO TABS: 650.0000 mg | ORAL_TABLET | Freq: Four times a day (QID) | ORAL | Status: DC | PRN

## 2015-08-29 MED ORDER — METRONIDAZOLE IN NACL 5-0.79 MG/ML-% IV SOLN
500.0000 mg | Freq: Once | INTRAVENOUS | Status: DC
Start: 2015-08-29 — End: 2015-08-29

## 2015-08-29 MED ORDER — VANCOMYCIN HCL IN DEXTROSE 1-5 GM/200ML-% IV SOLN
1000.0000 mg | Freq: Once | INTRAVENOUS | Status: AC
  Administered 2015-08-29: 1000 mg via INTRAVENOUS
  Filled 2015-08-29: qty 200

## 2015-08-29 MED ORDER — DEXTROSE 5 % IV SOLN
1.0000 g | Freq: Once | INTRAVENOUS | Status: AC
  Administered 2015-08-29: 1 g via INTRAVENOUS
  Filled 2015-08-29: qty 1

## 2015-08-29 NOTE — ED Provider Notes (Signed)
[facility] Emergency Department Provider Note  ____________________________________________  Time seen: Approximately 1:15 PM  I have reviewed the triage vital signs and the nursing notes.   HISTORY  Chief Complaint Diarrhea  Caveat-history of present illness review of systems are limited due to the patient's dementia. All information is obtained from EMS and staff at [facility].  HPI Melissa Cooper is a 81 y.o. female with history of dementia, chronic any disease, hypertension and hyperlipidemia resents for evaluation of 2 days nonbloody diarrhea. Staff at [facility] received an order to give her a 500 cc normal saline bolus as she appeared dehydrated however they did not think that she responded enough to that and so they called EMS to bring her to the emergency department. Per EMS, she was midly tachycardic but maintaining adequate blood pressure.   Past Medical History  Diagnosis Date  . CKD (chronic kidney disease)   . Hypercholesteremia   . HTN (hypertension)   . OSA (obstructive sleep apnea)   . PA (pernicious anemia)   . Osteoarthritis     There are no active problems to display for this patient.   History reviewed. No pertinent past surgical history.  No current outpatient prescriptions on file.  Allergies Actonel; Ceftin; Evista; Miacalcin; and Zyrtec  No family history on file.  Social History Social History  Substance Use Topics  . Smoking status: Never Smoker   . Smokeless tobacco: None  . Alcohol Use: No    Review of Systems Constitutional: No fever/chills Gastrointestinal:   No nausea, no vomiting.  + diarrhea.  No constipation.   Caveat-history of present illness review of systems are limited due to the patient's dementia. All information is obtained from EMS and staff at [facility]. ____________________________________________   PHYSICAL EXAM:  Filed Vitals:   08/29/15 1316 08/29/15 1330 08/29/15 1435  BP: 132/68  127/68 150/64  Pulse: 113 110 107  Temp: 98.5 F (36.9 C)    Resp: Height:  (1.575 m)    Weight: 150 lb 1.6 oz (68.085 kg)    SpO2: 96% 96% 100%    VITAL SIGNS: ED Triage Vitals  Enc Vitals Group     BP --      Pulse --      Resp --      Temp --      Temp src --      SpO2 --      Weight --      Height --      Head Cir --      Peak Flow --      Pain Score --      Pain Loc --      Pain Edu? --      Excl. in GC? --     Constitutional: Alert and oriented to self only. Nontoxic appearing and in no acute distress. Eyes: Conjunctivae are normal. PERRL. EOMI. Head: Atraumatic. Nose: No congestion/rhinnorhea. Mouth/Throat: Mucous membranes are dry.  Oropharynx non-erythematous. Neck: No stridor. Cardiovascular: tachycaric rate, regular rhythm. Grossly normal heart sounds.  Good peripheral circulation. Respiratory: Normal respiratory effort.  No retractions. Lungs CTAB. Gastrointestinal: Soft with moderate diffuse tenderness to palpation. No CVA tenderness. Genitourinary: deferred Rectal: greenish black guaiac negative stool Musculoskeletal: No lower extremity tenderness nor edema.  No joint effusions. Neurologic:  Normal speech and language. No gross focal neurologic deficits are appreciated.  Skin:  Skin is warm, dry and intact. No rash noted. Psychiatric:  Mood and affect are normal. Speech and behavior are normal.  ____________________________________________   LABS (all labs ordered are listed, but only abnormal results are displayed)  Labs Reviewed  C DIFFICILE QUICK SCREEN W PCR REFLEX - Abnormal; Notable for the following:    C Diff antigen POSITIVE (*)    C Diff toxin POSITIVE (*)    All other components within normal limits  CBC WITH DIFFERENTIAL/PLATELET - Abnormal; Notable for the following:    WBC 34.4 (*)    Hemoglobin 10.5 (*)    HCT 34.1 (*)    MCV 76.0 (*)    MCH 23.4 (*)    MCHC 30.7 (*)    RDW 19.1 (*)    Neutro Abs 31.1 (*)     Monocytes Absolute 1.8 (*)    All other components within normal limits  COMPREHENSIVE METABOLIC PANEL - Abnormal; Notable for the following:    Glucose, Bld 133 (*)    BUN 37 (*)    Creatinine, Ser 1.39 (*)    Calcium 8.4 (*)    Total Protein 6.0 (*)    Albumin 2.9 (*)    AST 12 (*)    ALT 8 (*)    GFR calc non Af Amer 31 (*)    GFR calc Af Amer 36 (*)    All other components within normal limits  URINALYSIS COMPLETEWITH MICROSCOPIC ([facility] ONLY) - Abnormal; Notable for the following:    Color, Urine YELLOW (*)    APPearance HAZY (*)    Bacteria, UA RARE (*)    Squamous Epithelial / LPF 0-5 (*)    All other components within normal limits  GASTROINTESTINAL PANEL BY PCR, STOOL (REPLACES STOOL CULTURE)  LIPASE, BLOOD   ____________________________________________  EKG  ED ECG REPORT I, Gayla DossGayle, Breindel Collier A, the attending physician, personally viewed and interpreted this ECG.   Date: 08/29/2015  EKG Time: 13:44  Rate: 107  Rhythm: sinus tachycardia  Axis: normal  Intervals:none  ST&T Change: No acute ST elevation  ____________________________________________  RADIOLOGY  CT abdomen and pelvis IMPRESSION: 1. Thickening of the walls of the entire colon, most prominent thickening involving the transverse and ascending colon, with associated paracolic fluid stranding/inflammation, compatible with a diffuse colitis of infectious or inflammatory nature. No circumscribed fluid collection or abscess collection. No free intraperitoneal air. No associated bowel obstruction. 2. Additional chronic/incidental findings detailed above.  ____________________________________________   PROCEDURES  Procedure(s) performed: None  Critical Care performed: Yes, see critical care note(s). Total critical care time spent 30 minutes.  ____________________________________________   INITIAL IMPRESSION / ASSESSMENT AND PLAN / ED COURSE  Pertinent labs & imaging results that were available  during my care of the patient were reviewed by me and considered in my medical decision making (see chart for details).  Melissa Cooper is a 81 y.o. female with history of dementia, chronic any disease, hypertension and hyperlipidemia resents for evaluation of 2 days nonbloody diarrhea. On exam, she is nontoxic appearing. She is mildly tachycardic. She does have diffuse tenderness throughout her abdomen as well as foul-smelling greenish black stool which is guaiac negative. We will obtain labs, give IV fluids, obtain CT abdomen and pelvis. We'll obtain stool studies. Screen for C. difficile. Reassess for disposition.  ----------------------------------------- 4:14 PM on 08/29/2015 -----------------------------------------  white blood cell count was elevated at 34,000. Also mildly tachycardic and tachypnea Meeting multiple Sirs criteria. IV vancomycin and aztreonam were given empirically. CT of the abdomen and pelvis shows colitis. C. difficile positive. suspect  sepsis due to C. difficile colitis. Will add oral vancomycin. Case discussed with the hospitalist, Dr. Nemiah Commander for admission at this time. ____________________________________________   FINAL CLINICAL IMPRESSION(S) / ED DIAGNOSES  Final diagnoses:  Diarrhea, unspecified type  Clostridium difficile colitis  Sepsis, due to unspecified organism (HCC)      Gayla Doss, MD 08/29/15 [phone]

## 2015-08-29 NOTE — Progress Notes (Signed)
ANTICOAGULATION CONSULT NOTE - Initial Consult  Pharmacy Consult for Lovenox  Indication: VTE prophylaxis  Allergies  Allergen Reactions  . Actonel [Risedronate Sodium]   . Ceftin [Cefuroxime Axetil]   . Evista [Raloxifene]   . Miacalcin [Calcitonin ([place])]   . Zyrtec [Cetirizine]     Patient Measurements: Height: 5\' 2"  (157.5 cm) Weight: 150 lb 1.6 oz (68.085 kg) IBW/kg (Calculated) : 50.1 Heparin Dosing Weight:   Vital Signs: Temp: 98.2 F (36.8 C) (12/30 1834) BP: 117/68 mmHg (12/30 1834) Pulse Rate: 112 (12/30 1834)  Labs:  Recent Labs  08/29/15 1320  HGB 10.5*  HCT 34.1*  PLT 385  CREATININE 1.39*    Estimated Creatinine Clearance: 21.9 mL/min (by C-G formula based on Cr of 1.39).   Medical History: Past Medical History  Diagnosis Date  . CKD (chronic kidney disease)     stage 3  . Hypercholesteremia   . HTN (hypertension)   . OSA (obstructive sleep apnea)   . PA (pernicious anemia)   . Osteoarthritis   . Dementia     Medications:  Prescriptions prior to admission  Medication Sig Dispense Refill Last Dose  . acetaminophen (TYLENOL ARTHRITIS PAIN) 650 MG CR tablet Take 650 mg by mouth 3 (three) times daily.   08/29/2015 at 1400  . acetaminophen (TYLENOL) 325 MG tablet Take 650 mg by mouth 2 (two) times daily as needed.   07/31/2015  . alum & mag hydroxide-simeth (MAALOX/MYLANTA) 200-200-20 MG/5ML suspension Take 30 mLs by mouth every 4 (four) hours as needed for indigestion or heartburn.   08/10/2015  . aspirin 81 MG chewable tablet Chew 81 mg by mouth daily.   08/29/2015 at 0800  . ferrous fumarate (HEMOCYTE - 106 MG FE) 325 (106 Fe) MG TABS tablet Take 1 tablet by mouth daily.   08/29/2015 at 0800    Assessment: CrCl = 21.9 ml/min  Goal of Therapy:  DVT prophylaxis   Plan:  Lovenox 40 mg SQ Q24H originally ordered.  Will adjust dose to lovenox 30 mg SQ Q24H based on CrCl < 30 ml/min.   Sharae Zappulla D 08/29/2015,6:36 PM

## 2015-08-29 NOTE — ED Notes (Signed)
Pt comes into the ED via EMS from [facility] c/o diarrhea for the past two days.  Patient received 500 ml fluids at [facility] and then they sent patient here to receive more.  Patient denies any pain at this time and is in no apparent distress.

## 2015-08-29 NOTE — ED Notes (Signed)
MD at bedside. 

## 2015-08-29 NOTE — H&P (Signed)
[facility]   PATIENT NAME: Melissa Cooper    MR#:  [id number]  DATE OF BIRTH:  09-23-1917  DATE OF ADMISSION:  08/29/2015  PRIMARY CARE PHYSICIAN: Tillman Abide, MD   REQUESTING/REFERRING PHYSICIAN: Dr. Toney Rakes  CHIEF COMPLAINT:   Chief Complaint  Patient presents with  . Diarrhea    HISTORY OF PRESENT ILLNESS:  Melissa Cooper  is a 81 y.o. female with a known history of dementia, hypertension, hyperlipidemia, arthritis, chronic anemia and CK D stage III presents from [facility] skilled nursing facility secondary to worsening diarrhea for 3 days now. Patient has been a resident at [facility] for almost 10 months now. According to sounds, patient is bed bound at baseline and can get up with assistance. She has been doing fine up until 3 days ago when she started to have loose watery stools. Antidiarrheal medicines did not improve, she also received fluid bolus over the last 24 hours with no significant improvement in her diarrhea. She also started complaining of nausea, decreased by mouth intake and also lower abdominal pain. She is sent over to the emergency room. She is noted to be tachycardic. No fevers or chills noted. White count is elevated greater than 34,000. Stool for C. difficile is positive. No recent antibiotic use noted.  PAST MEDICAL HISTORY:   Past Medical History  Diagnosis Date  . CKD (chronic kidney disease)     stage 3  . Hypercholesteremia   . HTN (hypertension)   . OSA (obstructive sleep apnea)   . PA (pernicious anemia)   . Osteoarthritis   . Dementia     PAST SURGICAL HISTORY:   Past Surgical History  Procedure Laterality Date  . None      SOCIAL HISTORY:   Social History  Substance Use Topics  . Smoking status: Never Smoker   . Smokeless tobacco: Not on file  . Alcohol Use: No    FAMILY HISTORY:  No family history on file.  DRUG ALLERGIES:   Allergies  Allergen Reactions  . Actonel  [Risedronate Sodium]   . Ceftin [Cefuroxime Axetil]   . Evista [Raloxifene]   . Miacalcin [Calcitonin ([place])]   . Zyrtec [Cetirizine]     REVIEW OF SYSTEMS:   Review of Systems  Constitutional: Positive for malaise/fatigue. Negative for fever, chills and weight loss.  HENT: Positive for hearing loss. Negative for ear discharge, ear pain, nosebleeds and tinnitus.   Eyes: Negative for blurred vision, double vision and photophobia.  Respiratory: Negative for cough, hemoptysis, shortness of breath and wheezing.   Cardiovascular: Negative for chest pain, palpitations, orthopnea and leg swelling.  Gastrointestinal: Positive for nausea, abdominal pain and diarrhea. Negative for heartburn, vomiting, constipation and melena.  Genitourinary: Negative for dysuria, urgency, frequency and hematuria.  Musculoskeletal: Negative for myalgias, back pain and neck pain.       Chronic left leg pain  Skin: Negative for rash.  Neurological: Negative for dizziness, tremors, sensory change, speech change, focal weakness and headaches.  Endo/Heme/Allergies: Does not bruise/bleed easily.  Psychiatric/Behavioral: Negative for depression.    MEDICATIONS AT HOME:   Prior to Admission medications   Not on File      VITAL SIGNS:  Blood pressure 150/64, pulse 107, temperature 98.5 F (36.9 C), resp. rate 28, height  (1.575 m), weight 68.085 kg (150 lb 1.6 oz), SpO2 100 %.  PHYSICAL EXAMINATION:   Physical Exam  GENERAL:  81 y.o.-year-old elderly patient lying in the  bed with no acute distress.  EYES: Pupils equal, round, reactive to light and accommodation. No scleral icterus. Extraocular muscles intact.  HEENT: Head atraumatic, normocephalic. Oropharynx and nasopharynx clear. She is hard of hearing. NECK:  Supple, no jugular venous distention. No thyroid enlargement, no tenderness.  LUNGS: Normal breath sounds bilaterally, no wheezing, rales,rhonchi or crepitation. No use of accessory muscles of  respiration. Decreased bibasilar breath sounds. CARDIOVASCULAR: S1, S2 normal. No rubs, or gallops. 3/6 systolic murmur is present ABDOMEN: Soft, nontender, nondistended. Bowel sounds present. No organomegaly or mass.  EXTREMITIES: No pedal edema, cyanosis, or clubbing.  NEUROLOGIC: Cranial nerves II through XII are intact. Muscle strength 5/5 in all extremities. Sensation intact. Gait not checked.  PSYCHIATRIC: The patient is alert and oriented x 3.  SKIN: No obvious rash, lesion, or ulcer.   LABORATORY PANEL:   CBC  Recent Labs Lab 08/29/15 1320  WBC 34.4*  HGB 10.5*  HCT 34.1*  PLT 385   ------------------------------------------------------------------------------------------------------------------  Chemistries   Recent Labs Lab 08/29/15 1320  NA 135  K 4.0  CL 102  CO2 24  GLUCOSE 133*  BUN 37*  CREATININE 1.39*  CALCIUM 8.4*  AST 12*  ALT 8*  ALKPHOS 84  BILITOT 0.5   ------------------------------------------------------------------------------------------------------------------  Cardiac Enzymes No results for input(s): TROPONINI in the last 168 hours. ------------------------------------------------------------------------------------------------------------------  RADIOLOGY:  Ct Abdomen Pelvis Wo Contrast  08/29/2015  CLINICAL DATA:  Diarrhea for the past 2 days. EXAM: CT ABDOMEN AND PELVIS WITHOUT CONTRAST TECHNIQUE: Multidetector CT imaging of the abdomen and pelvis was performed following the standard protocol without IV contrast. COMPARISON:  None. FINDINGS: There is thickening of the walls of the entire colon, most severely involving the transverse colon and ascending colon, compatible with colitis of infectious or inflammatory nature. Associated pericolic fluid stranding and inflammation. Small bowel is normal in caliber. Appendix is normal. Additional diverticulosis noted within the sigmoid colon without evidence of acute diverticulitis. No fluid  collection or abscess. No free intraperitoneal air. No evidence of pneumatosis intestinalis. No portal venous gas seen. Liver, spleen, pancreas, and adrenal glands are unremarkable. Gallbladder is mildly distended but otherwise unremarkable. Kidneys are unremarkable without stone or hydronephrosis. No ureteral or bladder calculi. Atherosclerotic changes noted along the walls of the normal- caliber abdominal aorta. Trace pericardial effusion at the heart base versus mild pericardial wall thickening. Mild scarring/atelectasis at each lung base. Moderate-sized hiatal hernia. Degenerative changes are seen throughout the scoliotic thoracolumbar spine. No acute osseous abnormality appreciated. IMPRESSION: 1. Thickening of the walls of the entire colon, most prominent thickening involving the transverse and ascending colon, with associated paracolic fluid stranding/inflammation, compatible with a diffuse colitis of infectious or inflammatory nature. No circumscribed fluid collection or abscess collection. No free intraperitoneal air. No associated bowel obstruction. 2. Additional chronic/incidental findings detailed above. Electronically Signed   By: Bary Richard M.D.   On: 08/29/2015 16:07    EKG:   Orders placed or performed during the hospital encounter of 08/29/15  . ED EKG  . ED EKG  . EKG 12-Lead  . EKG 12-Lead    IMPRESSION AND PLAN:   Alura Olveda  is a 81 y.o. female with a known history of dementia, hypertension, hyperlipidemia, arthritis, chronic anemia and CK D stage III presents from [facility] skilled nursing facility secondary to worsening diarrhea for 3 days now.  #1 sepsis-with tachycardia and also elevated white count. -Secondary to Clostridium difficile colitis. -CT of the abdomen with diffuse colitis changes noted. -Soft diet  as tolerated. IV fluids. -Started on oral vancomycin with such elevated white count and also diffuse colitis changes noted on CT. -Continue to monitor WBC  count. Contact isolation for C. difficile. -Also added probiotics.  #2 hypertension-verify home medications. Not on any meds for now. -Monitor blood pressure with sepsis.  #3 pernicious anemia history-on B12 injections. Also continue oral B12 supplements  #4 CK D stage III-stable. Continue to monitor  #5 DVT prophylaxis-on Lovenox.    All the records are reviewed and case discussed with ED provider. Management plans discussed with the patient, family and they are in agreement.  CODE STATUS: DNR  TOTAL TIME TAKING CARE OF THIS PATIENT: 50 minutes.    Ausar Georgiou M.D on 08/29/2015 at 4:44 PM  Between 7am to 6pm - Pager - [phone]  After 6pm go to [email or web address] - password [email or web address] [email or web address]  [facility]  Office  [phone]  CC: Primary care physician; Tillman Abideichard Letvak, MD

## 2015-08-29 NOTE — ED Notes (Signed)
Admitting MD at bedside.

## 2015-08-30 DIAGNOSIS — L8990 Pressure ulcer of unspecified site, unspecified stage: Secondary | ICD-10-CM

## 2015-08-30 LAB — BASIC METABOLIC PANEL
Anion gap: 10 (ref 5–15)
BUN: 30 mg/dL — ABNORMAL HIGH (ref 6–20)
CALCIUM: 7.7 mg/dL — AB (ref 8.9–10.3)
CO2: 19 mmol/L — AB (ref 22–32)
CREATININE: 1.1 mg/dL — AB (ref 0.44–1.00)
Chloride: 109 mmol/L (ref 101–111)
GFR, EST AFRICAN AMERICAN: 48 mL/min — AB (ref >60)
GFR, EST NON AFRICAN AMERICAN: 41 mL/min — AB (ref >60)
Glucose, Bld: 125 mg/dL — ABNORMAL HIGH (ref 65–99)
Potassium: 3.6 mmol/L (ref 3.5–5.1)
Sodium: 138 mmol/L (ref 135–145)

## 2015-08-30 LAB — CBC
HCT: 30 % — ABNORMAL LOW (ref 35.0–47.0)
Hemoglobin: 9.3 g/dL — ABNORMAL LOW (ref 12.0–16.0)
MCH: 23.1 pg — AB (ref 26.0–34.0)
MCHC: 30.9 g/dL — AB (ref 32.0–36.0)
MCV: 74.8 fL — ABNORMAL LOW (ref 80.0–100.0)
PLATELETS: 367 10*3/uL (ref 150–440)
RBC: 4.01 MIL/uL (ref 3.80–5.20)
RDW: 18.5 % — ABNORMAL HIGH (ref 11.5–14.5)
WBC: 29.7 10*3/uL — ABNORMAL HIGH (ref 3.6–11.0)

## 2015-08-30 LAB — LACTIC ACID, PLASMA
Lactic Acid, Venous: 1.1 mmol/L (ref 0.5–2.0)
Lactic Acid, Venous: 1.1 mmol/L (ref 0.5–2.0)

## 2015-08-30 LAB — MRSA PCR SCREENING: MRSA by PCR: NEGATIVE

## 2015-08-30 MED ORDER — SODIUM BICARBONATE 8.4 % IV SOLN
INTRAVENOUS | Status: DC
  Administered 2015-08-30 – 2015-08-31 (×2): via INTRAVENOUS
  Filled 2015-08-30 (×4): qty 100

## 2015-08-30 MED ORDER — POTASSIUM CHLORIDE IN NACL 20-0.9 MEQ/L-% IV SOLN
INTRAVENOUS | Status: DC
  Administered 2015-08-30 – 2015-08-31 (×5): via INTRAVENOUS
  Filled 2015-08-30 (×7): qty 1000

## 2015-08-30 MED ORDER — VANCOMYCIN 50 MG/ML ORAL SOLUTION
125.0000 mg | Freq: Four times a day (QID) | ORAL | Status: DC
  Administered 2015-08-30 – 2015-09-05 (×26): 125 mg via ORAL
  Filled 2015-08-30 (×34): qty 2.5

## 2015-08-30 NOTE — Progress Notes (Addendum)
[facility] - [place] at [facility]   PATIENT NAME: Melissa Cooper    MR#:  [id number]  DATE OF BIRTH:  05/14/1918  SUBJECTIVE:  CHIEF COMPLAINT:   Chief Complaint  Patient presents with  . Diarrhea   patient is 81 year old Caucasian female with past medical history significant for history of CK D, hypertension, hyperlipidemia who presents to the hospital with complaints of diarrhea for 3 days. In emergency room, she had CT scan of the abdomen which revealed colitis and stool cultures showed C. difficile positivity, patient was initiated on vancomycin orally. She still complains of significant diarrhea almost every hour and that he watery stool. Remains tachycardic with heart rates ranging between 120s to 125. Blood pressure was relatively low, initially improved with IV fluid administration. Other cell count has improved, as well as renal insufficiency  Review of Systems  Constitutional: Positive for malaise/fatigue. Negative for fever, chills and weight loss.  HENT: Negative for congestion.   Eyes: Negative for blurred vision and double vision.  Respiratory: Negative for cough, sputum production, shortness of breath and wheezing.   Cardiovascular: Negative for chest pain, palpitations, orthopnea, leg swelling and PND.  Gastrointestinal: Positive for diarrhea. Negative for nausea, vomiting, abdominal pain, constipation and blood in stool.  Genitourinary: Negative for dysuria, urgency, frequency and hematuria.  Musculoskeletal: Negative for falls.  Neurological: Positive for weakness. Negative for dizziness, tremors, focal weakness and headaches.  Endo/Heme/Allergies: Does not bruise/bleed easily.  Psychiatric/Behavioral: Negative for depression. The patient does not have insomnia.     VITAL SIGNS: Blood pressure 124/55, pulse 125, temperature 98.2 F (36.8 C), temperature source Oral, resp. rate 18, height 5\' 2"  (1.575 m), weight 68.085 kg (150 lb 1.6 oz), SpO2  94 %.  PHYSICAL EXAMINATION:   GENERAL:  81 y.o.-year-old patient lying in the bed with no acute distress. Dry oral mucosa, pale and weak EYES: Pupils equal, round, reactive to light and accommodation. No scleral icterus. Extraocular muscles intact.  HEENT: Head atraumatic, normocephalic. Oropharynx and nasopharynx clear.  NECK:  Supple, no jugular venous distention. No thyroid enlargement, no tenderness.  LUNGS: Normal breath sounds bilaterally, no wheezing, rales,rhonchi or crepitation. No use of accessory muscles of respiration.  CARDIOVASCULAR: S1, S2 , tachycardic. No murmurs, rubs, or gallops.  ABDOMEN: Soft, nontender, nondistended. Bowel sounds present. No organomegaly or mass.  EXTREMITIES: No pedal edema, cyanosis, or clubbing.  NEUROLOGIC: Cranial nerves II through XII are intact. Muscle strength 5/5 in all extremities. Sensation intact. Gait not checked.  PSYCHIATRIC: The patient is alert and oriented x 3.  SKIN: No obvious rash, lesion, or ulcer.   ORDERS/RESULTS REVIEWED:   CBC  Recent Labs Lab 08/29/15 1320 08/30/15 0551  WBC 34.4* 29.7*  HGB 10.5* 9.3*  HCT 34.1* 30.0*  PLT 385 367  MCV 76.0* 74.8*  MCH 23.4* 23.1*  MCHC 30.7* 30.9*  RDW 19.1* 18.5*  LYMPHSABS 1.4  --   MONOABS 1.8*  --   EOSABS 0.1  --   BASOSABS 0.1  --    ------------------------------------------------------------------------------------------------------------------  Chemistries   Recent Labs Lab 08/29/15 1320 08/30/15 0551  NA 135 138  K 4.0 3.6  CL 102 109  CO2 24 19*  GLUCOSE 133* 125*  BUN 37* 30*  CREATININE 1.39* 1.10*  CALCIUM 8.4* 7.7*  AST 12*  --   ALT 8*  --   ALKPHOS 84  --   BILITOT 0.5  --    ------------------------------------------------------------------------------------------------------------------ estimated creatinine clearance is 27.7 mL/min (by C-G formula  based on Cr of  1.1). ------------------------------------------------------------------------------------------------------------------ No results for input(s): TSH, T4TOTAL, T3FREE, THYROIDAB in the last 72 hours.  Invalid input(s): FREET3  Cardiac Enzymes No results for input(s): CKMB, TROPONINI, MYOGLOBIN in the last 168 hours.  Invalid input(s): CK ------------------------------------------------------------------------------------------------------------------ Invalid input(s): POCBNP ---------------------------------------------------------------------------------------------------------------  RADIOLOGY: Ct Abdomen Pelvis Wo Contrast  08/29/2015  CLINICAL DATA:  Diarrhea for the past 2 days. EXAM: CT ABDOMEN AND PELVIS WITHOUT CONTRAST TECHNIQUE: Multidetector CT imaging of the abdomen and pelvis was performed following the standard protocol without IV contrast. COMPARISON:  None. FINDINGS: There is thickening of the walls of the entire colon, most severely involving the transverse colon and ascending colon, compatible with colitis of infectious or inflammatory nature. Associated pericolic fluid stranding and inflammation. Small bowel is normal in caliber. Appendix is normal. Additional diverticulosis noted within the sigmoid colon without evidence of acute diverticulitis. No fluid collection or abscess. No free intraperitoneal air. No evidence of pneumatosis intestinalis. No portal venous gas seen. Liver, spleen, pancreas, and adrenal glands are unremarkable. Gallbladder is mildly distended but otherwise unremarkable. Kidneys are unremarkable without stone or hydronephrosis. No ureteral or bladder calculi. Atherosclerotic changes noted along the walls of the normal- caliber abdominal aorta. Trace pericardial effusion at the heart base versus mild pericardial wall thickening. Mild scarring/atelectasis at each lung base. Moderate-sized hiatal hernia. Degenerative changes are seen throughout the scoliotic  thoracolumbar spine. No acute osseous abnormality appreciated. IMPRESSION: 1. Thickening of the walls of the entire colon, most prominent thickening involving the transverse and ascending colon, with associated paracolic fluid stranding/inflammation, compatible with a diffuse colitis of infectious or inflammatory nature. No circumscribed fluid collection or abscess collection. No free intraperitoneal air. No associated bowel obstruction. 2. Additional chronic/incidental findings detailed above. Electronically Signed   By: Bary Richard M.D.   On: 08/29/2015 16:07    EKG:  Orders placed or performed during the hospital encounter of 08/29/15  . ED EKG  . ED EKG  . EKG 12-Lead  . EKG 12-Lead    ASSESSMENT AND PLAN:  Active Problems:   Sepsis (HCC)   Pressure ulcer 1. Sepsis due to C. difficile colitis, continue antibiotic therapy with vancomycin orally and follow clinically. White blood cell count has been improving 2. C. difficile enterocolitis, continue antibiotics as above as well as lactobacilli, follow clinically, continue IV fluids 3. Acidosis, initiate patient on bicarbonate drip, follow bicarbonate level in the morning labs, advance IV fluids. Suspect lactic acidosis, check lactate level 4. Renal insufficiency, improving with IV fluid administration, no obvious UTI 5. Leukocytosis, not otherwise specified, follow with antibiotic therapy  Management plans discussed with the patient, patient's son and they are in agreement.   DRUG ALLERGIES:  Allergies  Allergen Reactions  . Actonel [Risedronate Sodium]   . Ceftin [Cefuroxime Axetil]   . Evista [Raloxifene]   . Miacalcin [Calcitonin ([place])]   . Zyrtec [Cetirizine]     CODE STATUS:     Code Status Orders        Start     Ordered   08/29/15 1836  Do not attempt resuscitation (DNR)   Continuous    Question Answer Comment  In the event of cardiac or respiratory ARREST Do not call a "code blue"   In the event of cardiac  or respiratory ARREST Do not perform Intubation, CPR, defibrillation or ACLS   In the event of cardiac or respiratory ARREST Use medication by any route, position, wound care, and other measures to relive pain and suffering. May  use oxygen, suction and manual treatment of airway obstruction as needed for comfort.      08/29/15 1835    Advance Directive Documentation        Most Recent Value   Type of Advance Directive  Healthcare Power of Attorney   Pre-existing out of facility DNR order (yellow form or pink MOST form)     "MOST" Form in Place?        TOTAL CRITICAL CARE TIME TAKING CARE OF THIS PATIENT: 45 minutes.  Treatment plan was discussed this patient's son extensively. Time spent approximately 10 minutes  Ramy Greth M.D on 08/30/2015 at 12:58 PM  Between 7am to 6pm - Pager - [phone]  After 6pm go to [email or web address] - password [email or web address] [facility]  [place] [facility]  Office  [phone]  CC: Primary care physician; Tillman Abide, MD

## 2015-08-30 NOTE — Clinical Social Work Note (Addendum)
Clinical Social Work Assessment  Patient Details  Name: Melissa Cooper MRN: [id number] Date of Birth: 10/27/1917  Date of referral:  08/30/15               Reason for consult:  Facility Placement                Permission sought to share information with:  Facility Medical sales representativeContact Representative, Family Supports Permission granted to share information::     Name::     Paulino RilyCarl Richard Alpern, John Rosalee KaufmanWesley Enns, D.IHarvin Hazel. L Janice  Garverick  Agency::  Yes  Relationship::   Sons  Contact Information:  Yes  Housing/Transportation Living arrangements for the past 2 months:  Skilled Building surveyorursing Facility Source of Information:  Patient, Adult Children Patient Interpreter Needed:  None Criminal Activity/Legal Involvement Pertinent to Current Situation/Hospitalization:  No - Comment as needed Significant Relationships:  Adult Children Lives with:  Facility Resident Do you feel safe going back to the place where you live?  Yes Need for family participation in patient care:  Yes (Comment)  Care giving concerns:  None at this time   Social Worker assessment / plan: Patient is  81 year old female. She is well taken care of at [facility] and will return when she is well. She has great support from her sons who lives nearby and who was visiting while this assessment completed. She will need EMS transport back to facility.  Employment status:  Retired Health and safety inspectornsurance information:  [place] Recommendations:    Not assessed at this time Information / Referral to community resources:  Skilled Nursing Facility  Patient/Family's Response to care:  She is doing great and they wish a speedy recovery and state she will return [facility]  Patient/Family's Understanding of and Emotional Response to Diagnosis, Current Treatment, and Prognosis:  Good understanding its their plan once pt is better will return to [facility]  Emotional Assessment Appearance:  Appears stated age Attitude/Demeanor/Rapport:   Calm  polite and alert Affect (typically observed):  Calm, Pleasant, Quiet, Accepting Orientation:  Oriented to Self, Oriented to Place Alcohol / Substance use:  Never Used Psych involvement (Current and /or in the community):  No (Comment)  Discharge Needs  Concerns to be addressed:  No discharge needs identified Readmission within the last 30 days:  No Current discharge risk:  None Barriers to Discharge:  No Barriers Identified   Cheron SchaumannBandi, Deryk Bozman M, LCSW 08/30/2015, 9:42 AM

## 2015-08-30 NOTE — NC FL2 (Signed)
  [place] MEDICAID FL2 LEVEL OF CARE SCREENING TOOL     IDENTIFICATION  Patient Name: Melissa Cooper Birthdate: 01/10/1918 Sex: female Admission Date (Current Location): 08/29/2015  [facility] [place]   Facility and Address:  [facility], [address], [place], [place] [address]      Provider Number: [id number]  Attending Physician Name and Address:  Katharina Caperima Vaickute, MD  Relative Name and Phone Number:       Current Level of Care: SNF Recommended Level of Care: Skilled Nursing Facility Prior Approval Number:    Date Approved/Denied:   PASRR Number:    Discharge Plan: SNF    Current Diagnoses: Patient Active Problem List   Diagnosis Date Noted  . Pressure ulcer 08/30/2015  . Sepsis (HCC) 08/29/2015    Orientation RESPIRATION BLADDER Height & Weight    Self, Time, Situation, Place  Normal Incontinent   150 lbs.  BEHAVIORAL SYMPTOMS/MOOD NEUROLOGICAL BOWEL NUTRITION STATUS      Incontinent  (Normal)  AMBULATORY STATUS COMMUNICATION OF NEEDS Skin   Extensive Assist Verbally Normal                       Personal Care Assistance Level of Assistance  Bathing, Dressing, Feeding Bathing Assistance: Maximum assistance Feeding assistance: Independent Dressing Assistance: Maximum assistance     Functional Limitations Info  Sight, Hearing, Speech Sight Info: Adequate Hearing Info: Impaired Speech Info: Adequate    SPECIAL CARE FACTORS FREQUENCY                      Contractures      Additional Factors Info                  Current Medications (08/30/2015):  This is the current hospital active medication list Current Facility-Administered Medications  Medication Dose Route Frequency Provider Last Rate Last Dose  . 0.9 % NaCl with KCl 20 mEq/ L  infusion   Intravenous Continuous Katharina Caperima Vaickute, MD      . acetaminophen (TYLENOL) tablet 650 mg  650 mg Oral Q6H PRN Enid Baasadhika Kalisetti, MD       Or   . acetaminophen (TYLENOL) suppository 650 mg  650 mg Rectal Q6H PRN Enid Baasadhika Kalisetti, MD      . acidophilus (RISAQUAD) capsule 1 capsule  1 capsule Oral BID Enid Baasadhika Kalisetti, MD   1 capsule at 08/30/15 1002  . aspirin chewable tablet 81 mg  81 mg Oral Daily Enid Baasadhika Kalisetti, MD   81 mg at 08/30/15 1002  . enoxaparin (LOVENOX) injection 30 mg  30 mg Subcutaneous Q24H Enid Baasadhika Kalisetti, MD   30 mg at 08/29/15 1911  . ondansetron (ZOFRAN) tablet 4 mg  4 mg Oral Q6H PRN Enid Baasadhika Kalisetti, MD       Or  . ondansetron (ZOFRAN) injection 4 mg  4 mg Intravenous Q6H PRN Enid Baasadhika Kalisetti, MD      . vancomycin (VANCOCIN) 50 mg/mL oral solution 125 mg  125 mg Oral 4 times per day Katharina Caperima Vaickute, MD   125 mg at 08/30/15 0847  . vitamin B-12 (CYANOCOBALAMIN) tablet 1,000 mcg  1,000 mcg Oral Daily Enid Baasadhika Kalisetti, MD   1,000 mcg at 08/30/15 1002     Discharge Medications: Please see discharge summary for a list of discharge medications.  Relevant Imaging Results:  Relevant Lab Results:   Additional Information SSN [id number]  Cheron SchaumannBandi, Keia Rask M, [place]

## 2015-08-31 LAB — BASIC METABOLIC PANEL
Anion gap: 5 (ref 5–15)
BUN: 23 mg/dL — AB (ref 6–20)
CHLORIDE: 111 mmol/L (ref 101–111)
CO2: 23 mmol/L (ref 22–32)
Calcium: 7.3 mg/dL — ABNORMAL LOW (ref 8.9–10.3)
Creatinine, Ser: 1.02 mg/dL — ABNORMAL HIGH (ref 0.44–1.00)
GFR calc Af Amer: 52 mL/min — ABNORMAL LOW (ref >60)
GFR calc non Af Amer: 45 mL/min — ABNORMAL LOW (ref >60)
GLUCOSE: 138 mg/dL — AB (ref 65–99)
POTASSIUM: 3.8 mmol/L (ref 3.5–5.1)
Sodium: 139 mmol/L (ref 135–145)

## 2015-08-31 LAB — CBC
HEMATOCRIT: 26.7 % — AB (ref 35.0–47.0)
Hemoglobin: 8.1 g/dL — ABNORMAL LOW (ref 12.0–16.0)
MCH: 23 pg — AB (ref 26.0–34.0)
MCHC: 30.5 g/dL — AB (ref 32.0–36.0)
MCV: 75.5 fL — AB (ref 80.0–100.0)
Platelets: 335 10*3/uL (ref 150–440)
RBC: 3.53 MIL/uL — ABNORMAL LOW (ref 3.80–5.20)
RDW: 19.4 % — AB (ref 11.5–14.5)
WBC: 23.2 10*3/uL — ABNORMAL HIGH (ref 3.6–11.0)

## 2015-08-31 MED ORDER — TRAMADOL HCL 50 MG PO TABS
25.0000 mg | ORAL_TABLET | Freq: Two times a day (BID) | ORAL | Status: DC
  Administered 2015-08-31: 25 mg via ORAL
  Filled 2015-08-31: qty 1

## 2015-08-31 MED ORDER — TRAMADOL HCL 50 MG PO TABS: 25.0000 mg | ORAL_TABLET | Freq: Two times a day (BID) | ORAL | Status: DC | PRN

## 2015-08-31 NOTE — Progress Notes (Signed)
[facility] - [place] at [facility]   PATIENT NAME: Melissa Cooper    MR#:  [id number]  DATE OF BIRTH:  1918-08-17  SUBJECTIVE:  CHIEF COMPLAINT:   Chief Complaint  Patient presents with  . Diarrhea   patient is 82 year old Caucasian female with past medical history significant for history of CK D, hypertension, hyperlipidemia who presents to the hospital with complaints of diarrhea for 3 days. In emergency room, she had CT scan of the abdomen which revealed colitis and stool cultures showed C. difficile , patient was initiated on vancomycin orally. She still has frequent diarrhea , but not as watery stool , less tachycardic . Bicarbonate level improved on bicarbonate intravenously. Seems to be more comfortable overall but admits of some abdominal pain.       Review of Systems  Constitutional: Positive for malaise/fatigue. Negative for fever, chills and weight loss.  HENT: Negative for congestion.   Eyes: Negative for blurred vision and double vision.  Respiratory: Negative for cough, sputum production, shortness of breath and wheezing.   Cardiovascular: Negative for chest pain, palpitations, orthopnea, leg swelling and PND.  Gastrointestinal: Positive for diarrhea. Negative for nausea, vomiting, abdominal pain, constipation and blood in stool.  Genitourinary: Negative for dysuria, urgency, frequency and hematuria.  Musculoskeletal: Negative for falls.  Neurological: Positive for weakness. Negative for dizziness, tremors, focal weakness and headaches.  Endo/Heme/Allergies: Does not bruise/bleed easily.  Psychiatric/Behavioral: Negative for depression. The patient does not have insomnia.     VITAL SIGNS: Blood pressure 147/61, pulse 111, temperature 98.1 F (36.7 C), temperature source Oral, resp. rate 17, height 5\' 2"  (1.575 m), weight 68.085 kg (150 lb 1.6 oz), SpO2 97 %.  PHYSICAL EXAMINATION:   GENERAL:  82 y.o.-year-old patient lying in the bed with no  acute distress. Dry oral mucosa, pale and weak EYES: Pupils equal, round, reactive to light and accommodation. No scleral icterus. Extraocular muscles intact.  HEENT: Head atraumatic, normocephalic. Oropharynx and nasopharynx clear.  NECK:  Supple, no jugular venous distention. No thyroid enlargement, no tenderness.  LUNGS: Normal breath sounds bilaterally, no wheezing, rales,rhonchi or crepitation. No use of accessory muscles of respiration.  CARDIOVASCULAR: S1, S2 , tachycardic. No murmurs, rubs, or gallops.  ABDOMEDiffuse discomfort on palpation mostly on the right side with some voluntary guarding,  nondistended. Bowel sounds present. No organomegaly or mass.  EXTREMITIES: No pedal edema, cyanosis, or clubbing.  NEUROLOGIC: Cranial nerves II through XII are intact. Muscle strength 5/5 in all extremities. Sensation intact. Gait not checked.  PSYCHIATRIC: The patient is alert and oriented x 3.  SKIN: No obvious rash, lesion, or ulcer.   ORDERS/RESULTS REVIEWED:   CBC  Recent Labs Lab 08/29/15 1320 08/30/15 0551 08/31/15 0546  WBC 34.4* 29.7* 23.2*  HGB 10.5* 9.3* 8.1*  HCT 34.1* 30.0* 26.7*  PLT 385 367 335  MCV 76.0* 74.8* 75.5*  MCH 23.4* 23.1* 23.0*  MCHC 30.7* 30.9* 30.5*  RDW 19.1* 18.5* 19.4*  LYMPHSABS 1.4  --   --   MONOABS 1.8*  --   --   EOSABS 0.1  --   --   BASOSABS 0.1  --   --    ------------------------------------------------------------------------------------------------------------------  Chemistries   Recent Labs Lab 08/29/15 1320 08/30/15 0551 08/31/15 0546  NA 135 138 139  K 4.0 3.6 3.8  CL 102 109 111  CO2 24 19* 23  GLUCOSE 133* 125* 138*  BUN 37* 30* 23*  CREATININE 1.39* 1.10* 1.02*  CALCIUM 8.4* 7.7* 7.3*  AST 12*  --   --   ALT 8*  --   --   ALKPHOS 84  --   --   BILITOT 0.5  --   --    ------------------------------------------------------------------------------------------------------------------ estimated creatinine clearance  is 29.8 mL/min (by C-G formula based on Cr of 1.02). ------------------------------------------------------------------------------------------------------------------ No results for input(s): TSH, T4TOTAL, T3FREE, THYROIDAB in the last 72 hours.  Invalid input(s): FREET3  Cardiac Enzymes No results for input(s): CKMB, TROPONINI, MYOGLOBIN in the last 168 hours.  Invalid input(s): CK ------------------------------------------------------------------------------------------------------------------ Invalid input(s): POCBNP ---------------------------------------------------------------------------------------------------------------  RADIOLOGY: Ct Abdomen Pelvis Wo Contrast  08/29/2015  CLINICAL DATA:  Diarrhea for the past 2 days. EXAM: CT ABDOMEN AND PELVIS WITHOUT CONTRAST TECHNIQUE: Multidetector CT imaging of the abdomen and pelvis was performed following the standard protocol without IV contrast. COMPARISON:  None. FINDINGS: There is thickening of the walls of the entire colon, most severely involving the transverse colon and ascending colon, compatible with colitis of infectious or inflammatory nature. Associated pericolic fluid stranding and inflammation. Small bowel is normal in caliber. Appendix is normal. Additional diverticulosis noted within the sigmoid colon without evidence of acute diverticulitis. No fluid collection or abscess. No free intraperitoneal air. No evidence of pneumatosis intestinalis. No portal venous gas seen. Liver, spleen, pancreas, and adrenal glands are unremarkable. Gallbladder is mildly distended but otherwise unremarkable. Kidneys are unremarkable without stone or hydronephrosis. No ureteral or bladder calculi. Atherosclerotic changes noted along the walls of the normal- caliber abdominal aorta. Trace pericardial effusion at the heart base versus mild pericardial wall thickening. Mild scarring/atelectasis at each lung base. Moderate-sized hiatal hernia. Degenerative  changes are seen throughout the scoliotic thoracolumbar spine. No acute osseous abnormality appreciated. IMPRESSION: 1. Thickening of the walls of the entire colon, most prominent thickening involving the transverse and ascending colon, with associated paracolic fluid stranding/inflammation, compatible with a diffuse colitis of infectious or inflammatory nature. No circumscribed fluid collection or abscess collection. No free intraperitoneal air. No associated bowel obstruction. 2. Additional chronic/incidental findings detailed above. Electronically Signed   By: Bary Richard M.D.   On: 08/29/2015 16:07    EKG:  Orders placed or performed during the hospital encounter of 08/29/15  . ED EKG  . ED EKG  . EKG 12-Lead  . EKG 12-Lead    ASSESSMENT AND PLAN:  Active Problems:   Sepsis (HCC)   Pressure ulcer 1. Sepsis due to C. difficile colitis, continue antibiotic therapy with vancomycin orally and follow clinically. White blood cell count has been improving, medically patient is improving  2. C. difficile enterocolitis, continue antibiotics as above as well as lactobacilli, improvedclinically, continue IV fluids, advance diet to full liquids  3. Acidosis,. Continue patient on low rate bicarbonate drip,improved bicarbonate level, Continue IV fluids. lactic acid level was normal 4. Renal insufficiency, improving with IV fluid administration, no obvious UTI 5. Leukocytosis, not otherwise specified,  Resolving antibiotic therapy  Management plans discussed with the patient, patient's son and they are in agreement.   DRUG ALLERGIES:  Allergies  Allergen Reactions  . Actonel [Risedronate Sodium]   . Ceftin [Cefuroxime Axetil]   . Evista [Raloxifene]   . Miacalcin [Calcitonin ([place])]   . Zyrtec [Cetirizine]     CODE STATUS:     Code Status Orders        Start     Ordered   08/29/15 1836  Do not attempt resuscitation (DNR)   Continuous    Question Answer Comment  In the event of  cardiac or  respiratory ARREST Do not call a "code blue"   In the event of cardiac or respiratory ARREST Do not perform Intubation, CPR, defibrillation or ACLS   In the event of cardiac or respiratory ARREST Use medication by any route, position, wound care, and other measures to relive pain and suffering. May use oxygen, suction and manual treatment of airway obstruction as needed for comfort.      08/29/15 1835    Advance Directive Documentation        Most Recent Value   Type of Advance Directive  Healthcare Power of Attorney   Pre-existing out of facility DNR order (yellow form or pink MOST form)     "MOST" Form in Place?        TOTAL CRITICAL CARE TIME TAKING CARE OF THIS PATIENT:  435 es.   Katharina CaperVAICKUTE,Bram Hottel M.D on 08/31/2015 at 12:45 PM  Between 7am to 6pm - Pager - [phone]  After 6pm go to [email or web address] - password [email or web address] [facility]  [facility]  Office  [phone]  CC: Primary care physician; Tillman Abideichard Letvak, MD

## 2015-09-01 LAB — BASIC METABOLIC PANEL
ANION GAP: 5 (ref 5–15)
BUN: 17 mg/dL (ref 6–20)
CHLORIDE: 107 mmol/L (ref 101–111)
CO2: 25 mmol/L (ref 22–32)
Calcium: 7.4 mg/dL — ABNORMAL LOW (ref 8.9–10.3)
Creatinine, Ser: 0.89 mg/dL (ref 0.44–1.00)
GFR, EST NON AFRICAN AMERICAN: 53 mL/min — AB (ref >60)
Glucose, Bld: 149 mg/dL — ABNORMAL HIGH (ref 65–99)
POTASSIUM: 4.3 mmol/L (ref 3.5–5.1)
SODIUM: 137 mmol/L (ref 135–145)

## 2015-09-01 LAB — CBC
HEMATOCRIT: 29.9 % — AB (ref 35.0–47.0)
HEMOGLOBIN: 9.4 g/dL — AB (ref 12.0–16.0)
MCH: 24 pg — ABNORMAL LOW (ref 26.0–34.0)
MCHC: 31.4 g/dL — ABNORMAL LOW (ref 32.0–36.0)
MCV: 76.4 fL — AB (ref 80.0–100.0)
Platelets: 311 10*3/uL (ref 150–440)
RBC: 3.91 MIL/uL (ref 3.80–5.20)
RDW: 19.2 % — AB (ref 11.5–14.5)
WBC: 15.1 10*3/uL — AB (ref 3.6–11.0)

## 2015-09-01 MED ORDER — SODIUM CHLORIDE 0.9 % IV SOLN
INTRAVENOUS | Status: DC
Start: 2015-09-01 — End: 2015-09-02
  Administered 2015-09-01: 19:00:00 via INTRAVENOUS

## 2015-09-01 MED ORDER — ENSURE ENLIVE PO LIQD
237.0000 mL | Freq: Two times a day (BID) | ORAL | Status: DC
  Administered 2015-09-02 – 2015-09-05 (×6): 237 mL via ORAL

## 2015-09-01 MED ORDER — ENOXAPARIN SODIUM 40 MG/0.4ML ~~LOC~~ SOLN
40.0000 mg | SUBCUTANEOUS | Status: DC
  Administered 2015-09-01 – 2015-09-03 (×3): 40 mg via SUBCUTANEOUS
  Filled 2015-09-01 (×3): qty 0.4

## 2015-09-01 NOTE — Progress Notes (Signed)
Notified MD of noted wheezes, discussed care, orders taken.

## 2015-09-01 NOTE — Care Management Important Message (Signed)
Important Message  Patient Details  Name: Melissa Cooper MRN: [id number] Date of Birth: 08/05/1918   Medicare Important Message Given:  Yes    Adonis HugueninBerkhead, Edrei Norgaard L, RN 09/01/2015, 9:56 AM

## 2015-09-01 NOTE — Clinical Documentation Improvement (Signed)
Hospitalist  Can the diagnosis of pressure ulcer be further specified? Nurses notes state stage 1 on buttock.   Stage 1 on buttock  Other  Clinically Undetermined    Supporting Information: Nurses notes state stage 1 on buttock with sacral dressing.  MD states in progress notes of 08/31/15 as pressure ulcer.  Need site and stage in active problems.   Please exercise your independent, professional judgment when responding. A specific answer is not anticipated or expected.   Thank Modesta MessingYou,  Alyanah Elliott L [facility] Information Management [place] [phone]

## 2015-09-01 NOTE — Progress Notes (Signed)
82 y/o F with C difficile colitis on Lovenox 30 mg daily for DVT prophylaxis.   CrCl= 34.2 ml/min  Due to improved CrCl, will increase Lovenox to 40 mg daily.   Luisa HartScott Tiwatope Emmitt, PharmD

## 2015-09-01 NOTE — Progress Notes (Signed)
[facility] - [place] at [facility]   PATIENT NAME: Melissa Cooper    MR#:  [id number]  DATE OF BIRTH:  1917/09/23  SUBJECTIVE:  CHIEF COMPLAINT:   Chief Complaint  Patient presents with  . Diarrhea   patient is 82 year old Caucasian female with past medical history significant for history of CK D, hypertension, hyperlipidemia who presents to the hospital with complaints of diarrhea for 3 days. In emergency room, she had CT scan of the abdomen which revealed colitis and stool cultures showed C. difficile , patient was initiated on vancomycin orally. She still has frequent diarrhea , but not as watery stool , less tachycardic . Bicarbonate level improved on bicarbonate intravenously. Seems to be more comfortable overall but admits of some abdominal pain.   Started coughing and became short of breath yesterday, so IV fluids were discontinued. Chest x-ray still pending. Better oral intake overall, less diarrhea more formed.     Review of Systems  Constitutional: Positive for malaise/fatigue. Negative for fever, chills and weight loss.  HENT: Negative for congestion.   Eyes: Negative for blurred vision and double vision.  Respiratory: Negative for cough, sputum production, shortness of breath and wheezing.   Cardiovascular: Negative for chest pain, palpitations, orthopnea, leg swelling and PND.  Gastrointestinal: Positive for diarrhea. Negative for nausea, vomiting, abdominal pain, constipation and blood in stool.  Genitourinary: Negative for dysuria, urgency, frequency and hematuria.  Musculoskeletal: Negative for falls.  Neurological: Positive for weakness. Negative for dizziness, tremors, focal weakness and headaches.  Endo/Heme/Allergies: Does not bruise/bleed easily.  Psychiatric/Behavioral: Negative for depression. The patient does not have insomnia.     VITAL SIGNS: Blood pressure 136/68, pulse 116, temperature 97.5 F (36.4 C), temperature source Oral,  resp. rate 17, height 5\' 2"  (1.575 m), weight 68.085 kg (150 lb 1.6 oz), SpO2 98 %.  PHYSICAL EXAMINATION:   GENERAL:  82 y.o.-year-old patient lying in the bed with no acute distress. Dry oral mucosa, pale and weak EYES: Pupils equal, round, reactive to light and accommodation. No scleral icterus. Extraocular muscles intact.  HEENT: Head atraumatic, normocephalic. Oropharynx and nasopharynx clear.  NECK:  Supple, no jugular venous distention. No thyroid enlargement, no tenderness.  LUNGS: Normal breath sounds bilaterally, no wheezing, rales,rhonchi or crepitation. No use of accessory muscles of respiration.  CARDIOVASCULAR: S1, S2 , tachycardic. No murmurs, rubs, or gallops.  ABDOMEN, No discomfort on palpation,   rebound or guarding. Soft, nondistended. Bowel sounds present, some diminished. No organomegaly or mass.  EXTREMITIES: No pedal edema, cyanosis, or clubbing.  NEUROLOGIC: Cranial nerves II through XII are intact. Muscle strength 5/5 in all extremities. Sensation intact. Gait not checked.  PSYCHIATRIC: The patient is alert and oriented x 3.  SKIN: No obvious rash, lesion, or ulcer.   ORDERS/RESULTS REVIEWED:   CBC  Recent Labs Lab 08/29/15 1320 08/30/15 0551 08/31/15 0546 09/01/15 1011  WBC 34.4* 29.7* 23.2* 15.1*  HGB 10.5* 9.3* 8.1* 9.4*  HCT 34.1* 30.0* 26.7* 29.9*  PLT 385 367 335 311  MCV 76.0* 74.8* 75.5* 76.4*  MCH 23.4* 23.1* 23.0* 24.0*  MCHC 30.7* 30.9* 30.5* 31.4*  RDW 19.1* 18.5* 19.4* 19.2*  LYMPHSABS 1.4  --   --   --   MONOABS 1.8*  --   --   --   EOSABS 0.1  --   --   --   BASOSABS 0.1  --   --   --    ------------------------------------------------------------------------------------------------------------------  Chemistries   Recent Labs Lab  08/29/15 1320 08/30/15 0551 08/31/15 0546 09/01/15 1011  NA 135 138 139 137  K 4.0 3.6 3.8 4.3  CL 102 109 111 107  CO2 24 19* 23 25  GLUCOSE 133* 125* 138* 149*  BUN 37* 30* 23* 17  CREATININE  1.39* 1.10* 1.02* 0.89  CALCIUM 8.4* 7.7* 7.3* 7.4*  AST 12*  --   --   --   ALT 8*  --   --   --   ALKPHOS 84  --   --   --   BILITOT 0.5  --   --   --    ------------------------------------------------------------------------------------------------------------------ estimated creatinine clearance is 34.2 mL/min (by C-G formula based on Cr of 0.89). ------------------------------------------------------------------------------------------------------------------ No results for input(s): TSH, T4TOTAL, T3FREE, THYROIDAB in the last 72 hours.  Invalid input(s): FREET3  Cardiac Enzymes No results for input(s): CKMB, TROPONINI, MYOGLOBIN in the last 168 hours.  Invalid input(s): CK ------------------------------------------------------------------------------------------------------------------ Invalid input(s): POCBNP ---------------------------------------------------------------------------------------------------------------  RADIOLOGY: No results found.  EKG:  Orders placed or performed during the hospital encounter of 08/29/15  . ED EKG  . ED EKG  . EKG 12-Lead  . EKG 12-Lead    ASSESSMENT AND PLAN:  Active Problems:   Sepsis (HCC)   Pressure ulcer 1. Sepsis due to C. difficile colitis, continue antibiotic therapy with vancomycin orally, improving  clinically. White blood cell count has been improving.  2. C. difficile enterocolitis, continue antibiotics as above as well as lactobacilli, improved clinically, continue IV fluidsno CHF on chest x-ray continue full liquid diet   3. Acidosis, resolved , discontinue bicarbonate drip 4. Renal insufficiencyresolvedth IV fluid administration, no obvious UTI 5. Leukocytosis, not otherwise specified,  Resolving with antibiotic therapy 6. Malnutrition , dietary supplements with Ensure  Or boost if patient is willing  7. Stage I buttock pressure ulcer , nursing care   Management plans discussed with the patient, patient's son and  they are in agreement.   DRUG ALLERGIES:  Allergies  Allergen Reactions  . Actonel [Risedronate Sodium]   . Ceftin [Cefuroxime Axetil]   . Evista [Raloxifene]   . Miacalcin [Calcitonin ([place])]   . Zyrtec [Cetirizine]     CODE STATUS:     Code Status Orders        Start     Ordered   08/29/15 1836  Do not attempt resuscitation (DNR)   Continuous    Question Answer Comment  In the event of cardiac or respiratory ARREST Do not call a "code blue"   In the event of cardiac or respiratory ARREST Do not perform Intubation, CPR, defibrillation or ACLS   In the event of cardiac or respiratory ARREST Use medication by any route, position, wound care, and other measures to relive pain and suffering. May use oxygen, suction and manual treatment of airway obstruction as needed for comfort.      08/29/15 1835    Advance Directive Documentation        Most Recent Value   Type of Advance Directive  Healthcare Power of Attorney   Pre-existing out of facility DNR order (yellow form or pink MOST form)     "MOST" Form in Place?        TOTAL CRITICAL CARE TIME TAKING CARE OF THIS PATIENT:  35  minutes.   extensively discussed this patient's family  Genette Huertas M.D on 09/01/2015 at 1:49 PM  Between 7am to 6pm - Pager - [phone]  After 6pm go to [email or web address] - password [email or web address] [facility] Hospitalists  Office  [phone]  CC: Primary care physician; Tillman Abideichard Letvak, MD

## 2015-09-02 ENCOUNTER — Inpatient Hospital Stay: Payer: Medicare Other

## 2015-09-02 LAB — BLOOD GAS, ARTERIAL
ALLENS TEST (PASS/FAIL): POSITIVE — AB
Acid-base deficit: 4.7 mmol/L — ABNORMAL HIGH (ref 0.0–2.0)
BICARBONATE: 18.8 meq/L — AB (ref 21.0–28.0)
FIO2: 0.21
O2 Saturation: 95.7 %
PH ART: 7.42 (ref 7.350–7.450)
PO2 ART: 78 mmHg — AB (ref 83.0–108.0)
Patient temperature: 37
pCO2 arterial: 29 mmHg — ABNORMAL LOW (ref 32.0–48.0)

## 2015-09-02 MED ORDER — METHYLPREDNISOLONE SODIUM SUCC 40 MG IJ SOLR
40.0000 mg | INTRAMUSCULAR | Status: AC
  Administered 2015-09-02: 40 mg via INTRAVENOUS
  Filled 2015-09-02: qty 1

## 2015-09-02 MED ORDER — IPRATROPIUM-ALBUTEROL 0.5-2.5 (3) MG/3ML IN SOLN
3.0000 mL | RESPIRATORY_TRACT | Status: DC
  Administered 2015-09-02 – 2015-09-04 (×9): 3 mL via RESPIRATORY_TRACT
  Filled 2015-09-02 (×9): qty 3

## 2015-09-02 MED ORDER — GUAIFENESIN-CODEINE 100-10 MG/5ML PO SOLN
10.0000 mL | ORAL | Status: DC | PRN
  Administered 2015-09-02: 10 mL via ORAL
  Filled 2015-09-02: qty 10

## 2015-09-02 NOTE — Progress Notes (Signed)
[facility] - [place] at [facility]   PATIENT NAME: Melissa Cooper    MR#:  [id number]  DATE OF BIRTH:  01/04/1918  SUBJECTIVE:  CHIEF COMPLAINT:   Chief Complaint  Patient presents with  . Diarrhea   -Admitted for C. difficile colitis. Diarrhea is improving. -Complaining of dry cough that started this morning. Son at bedside. Denies any association of cough with eating. -Abdominal pain is improved  REVIEW OF SYSTEMS:  Review of Systems  Constitutional: Negative for fever and chills.  HENT: Positive for hearing loss. Negative for ear discharge, ear pain and nosebleeds.   Eyes: Negative for blurred vision.  Respiratory: Positive for cough and shortness of breath. Negative for wheezing.   Cardiovascular: Negative for chest pain and palpitations.  Gastrointestinal: Positive for diarrhea. Negative for nausea, vomiting, abdominal pain and constipation.  Genitourinary: Negative for dysuria.  Neurological: Positive for weakness. Negative for dizziness, seizures and headaches.    DRUG ALLERGIES:   Allergies  Allergen Reactions  . Actonel [Risedronate Sodium]   . Ceftin [Cefuroxime Axetil]   . Evista [Raloxifene]   . Miacalcin [Calcitonin ([place])]   . Zyrtec [Cetirizine]     VITALS:  Blood pressure 140/51, pulse 113, temperature 98 F (36.7 C), temperature source Oral, resp. rate 17, height 5\' 2"  (1.575 m), weight 68.085 kg (150 lb 1.6 oz), SpO2 100 %.  PHYSICAL EXAMINATION:  Physical Exam  GENERAL:  82 y.o.-year-old elderly patient lying in the bed with no acute distress.  EYES: Pupils equal, round, reactive to light and accommodation. No scleral icterus. Extraocular muscles intact.  HEENT: Head atraumatic, normocephalic. Oropharynx and nasopharynx clear.  NECK:  Supple, no jugular venous distention. No thyroid enlargement, no tenderness.  LUNGS: Diffuse scattered wheezing all over the lung fields, no rales,rhonchi or crepitation. No use of accessory  muscles of respiration.  CARDIOVASCULAR: S1, S2 normal. No rubs, or gallops. 3/6 systolic murmur is present ABDOMEN: Soft, nontender, nondistended. Bowel sounds present. No organomegaly or mass.  EXTREMITIES: No pedal edema, cyanosis, or clubbing.  NEUROLOGIC: Cranial nerves II through XII are intact. Muscle strength 5/5 in all extremities. Sensation intact. Gait not checked.  PSYCHIATRIC: The patient is alert and oriented x 3.  SKIN: No obvious rash, lesion, or ulcer.    LABORATORY PANEL:   CBC  Recent Labs Lab 09/01/15 1011  WBC 15.1*  HGB 9.4*  HCT 29.9*  PLT 311   ------------------------------------------------------------------------------------------------------------------  Chemistries   Recent Labs Lab 08/29/15 1320  09/01/15 1011  NA 135  < > 137  K 4.0  < > 4.3  CL 102  < > 107  CO2 24  < > 25  GLUCOSE 133*  < > 149*  BUN 37*  < > 17  CREATININE 1.39*  < > 0.89  CALCIUM 8.4*  < > 7.4*  AST 12*  --   --   ALT 8*  --   --   ALKPHOS 84  --   --   BILITOT 0.5  --   --   < > = values in this interval not displayed. ------------------------------------------------------------------------------------------------------------------  Cardiac Enzymes No results for input(s): TROPONINI in the last 168 hours. ------------------------------------------------------------------------------------------------------------------  RADIOLOGY:  Ct Chest Wo Contrast  09/02/2015  CLINICAL DATA:  Followup to current chest radiograph. Chest radiograph showed right hilar fullness. EXAM: CT CHEST WITHOUT CONTRAST TECHNIQUE: Multidetector CT imaging of the chest was performed following the standard protocol without IV contrast. COMPARISON:  09/02/2015. FINDINGS: Neck base and axilla: No  mass or adenopathy. Thyroid is unremarkable. Mediastinum and hila: Heart normal in size and configuration. There are moderate coronary artery calcifications. Trace pericardial fluid is noted. Moderate  size hiatal hernia. No mediastinal or hilar masses or enlarged lymph nodes. Lungs and pleura: Small right pleural effusion. There is bronchial wall thickening and adjacent linear and reticular opacity most evident in the lower lobes, right greater than left, and base of the right middle lobe and left upper lobe lingula, consistent with a combination of chronic bronchial wall thickening and associated subsegmental atelectasis no evidence of pneumonia or pulmonary edema. There is a 4 mm nodule in the right lower lobe, superior segment. Mild scarring is noted at the apices. Limited upper abdomen: Small amount of ascites. This has developed since the abdomen and pelvis CT dated 08/29/2015. Musculoskeletal: Diffuse bony demineralization. Significant degenerative changes are noted throughout the thoracic spine. Old healed mid sternal fracture. No osteoblastic or osteolytic lesions. IMPRESSION: 1. The fullness of the right hilum noted on the prior chest radiograph was due to prominence of the right hilar vasculature accentuated by low lung volumes. There is no mediastinal or hilar mass or adenopathy. 2. Small right pleural effusion. Mild dependent subsegmental atelectasis. No evidence of pneumonia or pulmonary edema. 3. Small amount ascites has developed since the prior abdomen and pelvis CT. 4. 4 mm right lower lobe nodule. If the patient is at high risk for bronchogenic carcinoma, follow-up chest CT at 1 year is recommended. If the patient is at low risk, no follow-up is needed. This recommendation follows the consensus statement: Guidelines for Management of Small Pulmonary Nodules Detected on CT Scans: A Statement from the Fleischner Society as published in Radiology 2005; 237:395-400. Electronically Signed   By: Amie Portland M.D.   On: 09/02/2015 10:17   Dg Chest Port 1 View  09/02/2015  CLINICAL DATA:  Shortness of breath. EXAM: PORTABLE CHEST 1 VIEW COMPARISON:  11/09/2014 .  07/11/2014.  04/24/2014. FINDINGS:  Stable fullness of the superior mediastinum consistent prominent vascularity noted . Right hilar fullness noted. This may be related atelectasis and or infiltrate right perihilar region however a right hilar mass and/or adenopathy cannot be excluded. Follow-up PA and lateral chest x-ray suggested. Small left pleural effusion. No pneumothorax . Heart size is normal. No acute osseous abnormality identified . IMPRESSION: 1. Right hilar fullness. Although this may be related to atelectasis and/or infiltrate in the right perihilar region, right hilar/perihilar mass cannot be excluded. Follow-up PA and lateral chest x-ray suggested. 2. Small left pleural effusion. Electronically Signed   By: Maisie Fus  Register   On: 09/02/2015 07:45    EKG:   Orders placed or performed during the hospital encounter of 08/29/15  . ED EKG  . ED EKG  . EKG 12-Lead  . EKG 12-Lead    ASSESSMENT AND PLAN:   Melissa Cooper is a 82 y.o. female with a known history of dementia, hypertension, hyperlipidemia, arthritis, chronic anemia and CK D stage III presents from [facility] secondary to worsening diarrhea for 3 days now.  #1 sepsis--Secondary to Clostridium difficile colitis. -CT of the abdomen with diffuse colitis changes noted. -Started on soft diet today. -Continue on oral vancomycin. Improving WBC count -Contact isolation for C. difficile. -Also added probiotics.  #2 Dry cough-dry cough with diffuse wheezing noted on exam today. -Could be reactive airway disease. -CT of the chest with increased right hilar vasculature, no infiltrates noted. No effusion noted. -Discontinue IV fluids. cough medicines added. -duo  nebs ordered. And also one dose of IV Solu-Medrol ordered.  #3 hypertension-verify home medications. Not on any meds for now. -Monitor blood pressure with sepsis.  #4 pernicious anemia history-on B12 injections.  #5 CK D stage III-stable. Continue to monitor  #6 DVT  prophylaxis-on Lovenox.     All the records are reviewed and case discussed with Care Management/Social Workerr. Management plans discussed with the patient, family and they are in agreement.  CODE STATUS: DO NOT RESUSCITATE  TOTAL TIME TAKING CARE OF THIS PATIENT: 40 minutes.   POSSIBLE D/C IN 2-3 DAYS, DEPENDING ON CLINICAL CONDITION.   Enid Baas M.D on 09/02/2015 at 3:47 PM  Between 7am to 6pm - Pager - [phone]  After 6pm go to [email or web address] - password [email or web address] [facility]  [facility] Hospitalists  Office  [phone]  CC: Primary care physician; Tillman Abide, MD

## 2015-09-03 LAB — CBC
HEMATOCRIT: 29.7 % — AB (ref 35.0–47.0)
HEMOGLOBIN: 9.2 g/dL — AB (ref 12.0–16.0)
MCH: 23.8 pg — ABNORMAL LOW (ref 26.0–34.0)
MCHC: 31.1 g/dL — ABNORMAL LOW (ref 32.0–36.0)
MCV: 76.4 fL — ABNORMAL LOW (ref 80.0–100.0)
Platelets: 263 10*3/uL (ref 150–440)
RBC: 3.88 MIL/uL (ref 3.80–5.20)
RDW: 19.6 % — ABNORMAL HIGH (ref 11.5–14.5)
WBC: 7.9 10*3/uL (ref 3.6–11.0)

## 2015-09-03 LAB — BASIC METABOLIC PANEL
ANION GAP: 4 — AB (ref 5–15)
BUN: 14 mg/dL (ref 6–20)
CHLORIDE: 108 mmol/L (ref 101–111)
CO2: 25 mmol/L (ref 22–32)
CREATININE: 0.87 mg/dL (ref 0.44–1.00)
Calcium: 7.5 mg/dL — ABNORMAL LOW (ref 8.9–10.3)
GFR calc non Af Amer: 55 mL/min — ABNORMAL LOW (ref >60)
Glucose, Bld: 108 mg/dL — ABNORMAL HIGH (ref 65–99)
POTASSIUM: 4 mmol/L (ref 3.5–5.1)
SODIUM: 137 mmol/L (ref 135–145)

## 2015-09-03 MED ORDER — GUAIFENESIN-CODEINE 100-10 MG/5ML PO SOLN
5.0000 mL | Freq: Four times a day (QID) | ORAL | Status: DC
  Administered 2015-09-03 – 2015-09-05 (×8): 5 mL via ORAL
  Filled 2015-09-03 (×8): qty 5

## 2015-09-03 MED ORDER — BENZONATATE 100 MG PO CAPS
100.0000 mg | ORAL_CAPSULE | Freq: Three times a day (TID) | ORAL | Status: DC
  Administered 2015-09-03 – 2015-09-05 (×7): 100 mg via ORAL
  Filled 2015-09-03 (×7): qty 1

## 2015-09-03 NOTE — Care Management Important Message (Signed)
Important Message  Patient Details  Name: Hezzie Bumpllene L Sloma MRN: [id number] Date of Birth: 06/14/1918   Medicare Important Message Given:  Yes    Adonis HugueninBerkhead, Brittanee Ghazarian L, RN 09/03/2015, 9:47 AM

## 2015-09-03 NOTE — Progress Notes (Signed)
[facility]   PATIENT NAME: Melissa Cooper    MR#:  [id number]  DATE OF BIRTH:  28-Sep-1917  SUBJECTIVE:  CHIEF COMPLAINT:   Chief Complaint  Patient presents with  . Diarrhea   -Admitted for C. difficile colitis. Diarrhea is improving. -still having dry cough today- better than yesterday - son at bedside  REVIEW OF SYSTEMS:  Review of Systems  Constitutional: Negative for fever and chills.  HENT: Positive for hearing loss. Negative for ear discharge, ear pain and nosebleeds.   Eyes: Positive for photophobia. Negative for blurred vision.  Respiratory: Positive for cough and shortness of breath. Negative for wheezing.   Cardiovascular: Negative for chest pain and palpitations.  Gastrointestinal: Positive for diarrhea. Negative for nausea, vomiting, abdominal pain and constipation.  Genitourinary: Negative for dysuria.  Neurological: Positive for weakness. Negative for dizziness, seizures and headaches.    DRUG ALLERGIES:   Allergies  Allergen Reactions  . Actonel [Risedronate Sodium]   . Ceftin [Cefuroxime Axetil]   . Evista [Raloxifene]   . Miacalcin [Calcitonin (Salmon)]   . Zyrtec [Cetirizine]     VITALS:  Blood pressure 139/58, pulse 112, temperature 97.7 F (36.5 C), temperature source Oral, resp. rate 18, height 5\' 2"  (1.575 m), weight 68.085 kg (150 lb 1.6 oz), SpO2 94 %.  PHYSICAL EXAMINATION:  Physical Exam  GENERAL:  82 y.o.-year-old elderly patient lying in the bed with no acute distress.  EYES: Pupils equal, round, reactive to light and accommodation. No scleral icterus. Extraocular muscles intact.  HEENT: Head atraumatic, normocephalic. Oropharynx and nasopharynx clear.  NECK:  Supple, no jugular venous distention. No thyroid enlargement, no tenderness.  LUNGS: no wheezing today, no rales,rhonchi or crepitation. No use of accessory muscles of respiration.  CARDIOVASCULAR: S1, S2 normal. No rubs, or gallops.  3/6 systolic murmur is present ABDOMEN: Soft, nontender, nondistended. Bowel sounds present. No organomegaly or mass.  EXTREMITIES: No pedal edema, cyanosis, or clubbing.  NEUROLOGIC: Cranial nerves II through XII are intact. Muscle strength 5/5 in all extremities. Sensation intact. Gait not checked.  PSYCHIATRIC: The patient is alert and oriented x 3.  SKIN: No obvious rash, lesion, or ulcer.    LABORATORY PANEL:   CBC  Recent Labs Lab 09/03/15 0507  WBC 7.9  HGB 9.2*  HCT 29.7*  PLT 263   ------------------------------------------------------------------------------------------------------------------  Chemistries   Recent Labs Lab 08/29/15 1320  09/03/15 0507  NA 135  < > 137  K 4.0  < > 4.0  CL 102  < > 108  CO2 24  < > 25  GLUCOSE 133*  < > 108*  BUN 37*  < > 14  CREATININE 1.39*  < > 0.87  CALCIUM 8.4*  < > 7.5*  AST 12*  --   --   ALT 8*  --   --   ALKPHOS 84  --   --   BILITOT 0.5  --   --   < > = values in this interval not displayed. ------------------------------------------------------------------------------------------------------------------  Cardiac Enzymes No results for input(s): TROPONINI in the last 168 hours. ------------------------------------------------------------------------------------------------------------------  RADIOLOGY:  Ct Chest Wo Contrast  09/02/2015  CLINICAL DATA:  Followup to current chest radiograph. Chest radiograph showed right hilar fullness. EXAM: CT CHEST WITHOUT CONTRAST TECHNIQUE: Multidetector CT imaging of the chest was performed following the standard protocol without IV contrast. COMPARISON:  09/02/2015. FINDINGS: Neck base and axilla: No mass or adenopathy. Thyroid is unremarkable. Mediastinum and hila: Heart normal in  size and configuration. There are moderate coronary artery calcifications. Trace pericardial fluid is noted. Moderate size hiatal hernia. No mediastinal or hilar masses or enlarged lymph nodes. Lungs  and pleura: Small right pleural effusion. There is bronchial wall thickening and adjacent linear and reticular opacity most evident in the lower lobes, right greater than left, and base of the right middle lobe and left upper lobe lingula, consistent with a combination of chronic bronchial wall thickening and associated subsegmental atelectasis no evidence of pneumonia or pulmonary edema. There is a 4 mm nodule in the right lower lobe, superior segment. Mild scarring is noted at the apices. Limited upper abdomen: Small amount of ascites. This has developed since the abdomen and pelvis CT dated 08/29/2015. Musculoskeletal: Diffuse bony demineralization. Significant degenerative changes are noted throughout the thoracic spine. Old healed mid sternal fracture. No osteoblastic or osteolytic lesions. IMPRESSION: 1. The fullness of the right hilum noted on the prior chest radiograph was due to prominence of the right hilar vasculature accentuated by low lung volumes. There is no mediastinal or hilar mass or adenopathy. 2. Small right pleural effusion. Mild dependent subsegmental atelectasis. No evidence of pneumonia or pulmonary edema. 3. Small amount ascites has developed since the prior abdomen and pelvis CT. 4. 4 mm right lower lobe nodule. If the patient is at high risk for bronchogenic carcinoma, follow-up chest CT at 1 year is recommended. If the patient is at low risk, no follow-up is needed. This recommendation follows the consensus statement: Guidelines for Management of Small Pulmonary Nodules Detected on CT Scans: A Statement from the Fleischner Society as published in Radiology 2005; 237:395-400. Electronically Signed   By: Amie Portlandavid  Ormond M.D.   On: 09/02/2015 10:17   Dg Chest Port 1 View  09/02/2015  CLINICAL DATA:  Shortness of breath. EXAM: PORTABLE CHEST 1 VIEW COMPARISON:  11/09/2014 .  07/11/2014.  04/24/2014. FINDINGS: Stable fullness of the superior mediastinum consistent prominent vascularity noted .  Right hilar fullness noted. This may be related atelectasis and or infiltrate right perihilar region however a right hilar mass and/or adenopathy cannot be excluded. Follow-up PA and lateral chest x-ray suggested. Small left pleural effusion. No pneumothorax . Heart size is normal. No acute osseous abnormality identified . IMPRESSION: 1. Right hilar fullness. Although this may be related to atelectasis and/or infiltrate in the right perihilar region, right hilar/perihilar mass cannot be excluded. Follow-up PA and lateral chest x-ray suggested. 2. Small left pleural effusion. Electronically Signed   By: Maisie Fushomas  Register   On: 09/02/2015 07:45    EKG:   Orders placed or performed during the hospital encounter of 08/29/15  . ED EKG  . ED EKG  . EKG 12-Lead  . EKG 12-Lead    ASSESSMENT AND PLAN:   Welton Flakesllene Silbernagel is a 82 y.o. female with a known history of dementia, hypertension, hyperlipidemia, arthritis, chronic anemia and CK D stage III presents from [facility] secondary to worsening diarrhea for 3 days now.  #1 sepsis--Secondary to Clostridium difficile colitis. -CT of the abdomen with diffuse colitis changes noted on admission. -on soft diet - tolerating well. -Continue on oral vancomycin. Improving WBC count -Contact isolation for C. difficile. -Also on probiotics.  #2 Dry cough-dry cough with diffuse wheezingyesterday- received nebs and 1 dose of solumedrol - no wheezing today, cough is still present, but improving - no aspiration or cough with eating. -Could be reactive airway disease. -CT of the chest with increased right hilar vasculature, no infiltrates  noted. No effusion noted. -Discontinued IV fluids. cough medicines added.  #3 hypertension-verify home medications. Not on any meds for now. -Monitor blood pressure with sepsis.  #4 pernicious anemia history-on B12 injections.  #5 CK D stage III-stable. Continue to monitor  #6 DVT prophylaxis-on  Lovenox.   Possible discharge to [facility]   All the records are reviewed and case discussed with Care Management/Social Workerr. Management plans discussed with the patient, family and they are in agreement.  CODE STATUS: DO NOT RESUSCITATE  TOTAL TIME TAKING CARE OF THIS PATIENT: 40 minutes.   POSSIBLE D/C TOMORROW, DEPENDING ON CLINICAL CONDITION.   Enid Baas M.D on 09/03/2015 at 3:59 PM  Between 7am to 6pm - Pager - [phone]  After 6pm go to [email or web address] - password [email or web address] [facility]  [facility]  Office  [phone]  CC: Primary care physician; Tillman Abide, MD

## 2015-09-03 NOTE — Clinical Social Work Note (Signed)
CSW provided update to [name] at [facility] this afternoon on patient's progress here in the hospital. [name] stated she would prepare to have patient go to a private room upon return due to cdiff. York SpanielMonica Debbie Bellucci MSW,LCSW [phone]

## 2015-09-04 LAB — BASIC METABOLIC PANEL
ANION GAP: 5 (ref 5–15)
BUN: 16 mg/dL (ref 6–20)
CO2: 25 mmol/L (ref 22–32)
Calcium: 7.7 mg/dL — ABNORMAL LOW (ref 8.9–10.3)
Chloride: 106 mmol/L (ref 101–111)
Creatinine, Ser: 1.11 mg/dL — ABNORMAL HIGH (ref 0.44–1.00)
GFR calc Af Amer: 47 mL/min — ABNORMAL LOW (ref >60)
GFR calc non Af Amer: 41 mL/min — ABNORMAL LOW (ref >60)
GLUCOSE: 121 mg/dL — AB (ref 65–99)
POTASSIUM: 4.3 mmol/L (ref 3.5–5.1)
Sodium: 136 mmol/L (ref 135–145)

## 2015-09-04 MED ORDER — IPRATROPIUM-ALBUTEROL 0.5-2.5 (3) MG/3ML IN SOLN: 3.0000 mL | RESPIRATORY_TRACT | Status: DC | PRN

## 2015-09-04 MED ORDER — SODIUM CHLORIDE 0.9 % IV SOLN
INTRAVENOUS | Status: AC
  Administered 2015-09-04 (×2): via INTRAVENOUS

## 2015-09-04 MED ORDER — ENOXAPARIN SODIUM 30 MG/0.3ML ~~LOC~~ SOLN
30.0000 mg | SUBCUTANEOUS | Status: DC
  Administered 2015-09-04: 30 mg via SUBCUTANEOUS
  Filled 2015-09-04: qty 0.3

## 2015-09-04 NOTE — Progress Notes (Signed)
[facility] [place] [facility]   PATIENT NAME: Melissa Flakesllene Kulak    MR#:  [id number]  DATE OF BIRTH:  09/06/1917  SUBJECTIVE:  CHIEF COMPLAINT:   Chief Complaint  Patient presents with  . Diarrhea   -Admitted for C. difficile colitis. Diarrhea is improving. -still having dry cough today- better than yesterday - son at bedside - Had hoped for discharge today but is tachycardic. No chest pain or shortness of breath  REVIEW OF SYSTEMS:  Review of Systems  Constitutional: Negative for fever and chills.  HENT: Positive for hearing loss. Negative for ear discharge, ear pain and nosebleeds.   Eyes: Positive for photophobia. Negative for blurred vision.  Respiratory: Positive for cough and shortness of breath. Negative for wheezing.   Cardiovascular: Negative for chest pain and palpitations.  Gastrointestinal: Positive for diarrhea. Negative for nausea, vomiting, abdominal pain and constipation.  Genitourinary: Negative for dysuria.  Neurological: Positive for weakness. Negative for dizziness, seizures and headaches.    DRUG ALLERGIES:   Allergies  Allergen Reactions  . Actonel [Risedronate Sodium]   . Ceftin [Cefuroxime Axetil]   . Evista [Raloxifene]   . Miacalcin [Calcitonin (Salmon)]   . Zyrtec [Cetirizine]     VITALS:  Blood pressure 114/76, pulse 122, temperature 98.1 F (36.7 C), temperature source Oral, resp. rate 14, height 5\' 2"  (1.575 m), weight 68.085 kg (150 lb 1.6 oz), SpO2 95 %.  PHYSICAL EXAMINATION:  Physical Exam  GENERAL:  82 y.o.-year-old elderly patient lying in the bed with no acute distress.  EYES: Pupils equal, round, reactive to light and accommodation. No scleral icterus. Extraocular muscles intact.  HEENT: Head atraumatic, normocephalic. Oropharynx and nasopharynx clear.  NECK:  Supple, no jugular venous distention. No thyroid enlargement, no tenderness.  LUNGS: no wheezing today, no rales,rhonchi or crepitation. No use  of accessory muscles of respiration.  CARDIOVASCULAR: S1, S2 normal. No rubs, or gallops. 3/6 systolic murmur is present ABDOMEN: Soft, nontender, nondistended. Bowel sounds present. No organomegaly or mass.  EXTREMITIES: No pedal edema, cyanosis, or clubbing. Pulses 2+ NEUROLOGIC: Cranial nerves II through XII are intact. Muscle strength 5/5 in all extremities. Sensation intact. Gait not checked.  PSYCHIATRIC: The patient is alert and oriented x 3.  SKIN: No obvious rash, lesion, or ulcer.    LABORATORY PANEL:   CBC  Recent Labs Lab 09/03/15 0507  WBC 7.9  HGB 9.2*  HCT 29.7*  PLT 263   ------------------------------------------------------------------------------------------------------------------  Chemistries   Recent Labs Lab 08/29/15 1320  09/04/15 0545  NA 135  < > 136  K 4.0  < > 4.3  CL 102  < > 106  CO2 24  < > 25  GLUCOSE 133*  < > 121*  BUN 37*  < > 16  CREATININE 1.39*  < > 1.11*  CALCIUM 8.4*  < > 7.7*  AST 12*  --   --   ALT 8*  --   --   ALKPHOS 84  --   --   BILITOT 0.5  --   --   < > = values in this interval not displayed. ------------------------------------------------------------------------------------------------------------------  Cardiac Enzymes No results for input(s): TROPONINI in the last 168 hours. ------------------------------------------------------------------------------------------------------------------  RADIOLOGY:  No results found.  EKG:   Orders placed or performed during the hospital encounter of 08/29/15  . ED EKG  . ED EKG  . EKG 12-Lead  . EKG 12-Lead    ASSESSMENT AND PLAN:   Millisa Chestine SporeClark is a 82  y.o. female with a known history of dementia, hypertension, hyperlipidemia, arthritis, chronic anemia and CK D stage III presents from [facility] secondary to worsening diarrhea for 3 days now.  #1 sepsis--Secondary to Clostridium difficile colitis. -CT of the abdomen with diffuse colitis  changes noted on admission. -on soft diet - tolerating well. -Continue on oral vancomycin. Improving WBC count, decreasing stool frequency -Contact isolation for C. difficile. -Also on probiotics.  #2 Dry cough-dry cough with diffuse wheezingyesterday- received nebs and 1 dose of solumedrol - no wheezing today, cough is still present, but improving - no aspiration or cough with eating. -Could be reactive airway disease. -CT of the chest with increased right hilar vasculature, no infiltrates noted. No effusion noted.  #3 hypertension: not hypertensive at this time  #4 pernicious anemia history-on B12 injections.  #5 CK D stage III-stable. Continue to monitor  #6 DVT prophylaxis-on Lovenox.  #7 tachycardia: Possibly due to volume depletion. She has not been eating. She is patchy I fluid losses. We'll start gentle hydration and monitor. Will also start telemetry, seems to be sinus tachycardia at this time   Possible discharge to [facility]   All the records are reviewed and case discussed with Care Management/Social Workerr. Management plans discussed with the patient, family and they are in agreement. Care plan discussed with the patient and her 2 sons at the bedside today  CODE STATUS: DO NOT RESUSCITATE  TOTAL TIME TAKING CARE OF THIS PATIENT: 40 minutes.   POSSIBLE D/C TOMORROW, DEPENDING ON CLINICAL CONDITION.   Elby Showers M.D on 09/04/2015 at 5:07 PM  Between 7am to 6pm - Pager - [phone] After 6pm go to [email or web address] - password [email or web address] [facility]  [facility]  Office  [phone]  CC: Primary care physician; Tillman Abide, MD

## 2015-09-04 NOTE — Progress Notes (Signed)
Anticoagulation Monitoring  82 y/o F with C difficile colitis on Lovenox 40 mg daily for DVT prophylaxis.   CrCl= 27.4 ml/min  Due to decline in CrCl, will decrease Lovenox to 30 mg daily per anticoagulation policy.   Clarisa Schoolsrystal Davius Goudeau, PharmD Clinical Pharmacist 09/04/2015

## 2015-09-04 NOTE — Progress Notes (Signed)
Initial Nutrition Assessment     INTERVENTION:  Meals and snacks: cater to pt preferences Medical Nutrition Supplement Therapy: Continue ensure for added nutrition Coordination of care: Will ask nursing to record po intake as pt on isolation   NUTRITION DIAGNOSIS:   Predicted suboptimal nutrient intake related to acute illness as evidenced by meal completion < 25%.    GOAL:   Patient will meet greater than or equal to 90% of their needs    MONITOR:    (Energy intake, Digestive system)  REASON FOR ASSESSMENT:   LOS    ASSESSMENT:      Pt admitted with c-diff colitis  Past Medical History  Diagnosis Date  . CKD (chronic kidney disease)     stage 3  . Hypercholesteremia   . HTN (hypertension)   . OSA (obstructive sleep apnea)   . PA (pernicious anemia)   . Osteoarthritis   . Dementia     Current Nutrition: limited recorded po intake, pt reports that she "thinks" she is eating. Few sips taken out of ensure at bedside  Food/Nutrition-Related History: pt does not remember. No family at bedside   Scheduled Medications:  . acidophilus  1 capsule Oral BID  . aspirin  81 mg Oral Daily  . benzonatate  100 mg Oral TID  . enoxaparin (LOVENOX) injection  30 mg Subcutaneous Q24H  . feeding supplement (ENSURE ENLIVE)  237 mL Oral BID BM  . guaiFENesin-codeine  5 mL Oral QID  . vancomycin  125 mg Oral 4 times per day  . vitamin B-12  1,000 mcg Oral Daily    Continuous Medications:  . sodium chloride 75 mL/hr at 09/04/15 1327     Electrolyte/Renal Profile and Glucose Profile:   Recent Labs Lab 09/01/15 1011 09/03/15 0507 09/04/15 0545  NA 137 137 136  K 4.3 4.0 4.3  CL 107 108 106  CO2 25 25 25   BUN 17 14 16   CREATININE 0.89 0.87 1.11*  CALCIUM 7.4* 7.5* 7.7*  GLUCOSE 149* 108* 121*   Protein Profile:  Recent Labs Lab 08/29/15 1320  ALBUMIN 2.9*    Gastrointestinal Profile: Last BM:1/5   Nutrition-Focused Physical Exam Findings:  Nutrition-Focused physical exam completed. Findings are WDL for fat depletion, muscle depletion, and edema.     Weight Change: pt unsure if lost wt    Diet Order:  DIET SOFT Room service appropriate?: Yes; Fluid consistency:: Thin  Skin:   reviewed   Height:   Ht Readings from Last 1 Encounters:  08/29/15 5\' 2"  (1.575 m)    Weight:   Wt Readings from Last 1 Encounters:  08/29/15 150 lb 1.6 oz (68.085 kg)    Ideal Body Weight:     BMI:  Body mass index is 27.45 kg/(m^2).  Estimated Nutritional Needs:   Kcal:  BEE 1028 kcals (IF 1.0-1.3, AF 1.3) [id number] kcals/d  Protein:  (1.0-1.3 g/kg) 68-88 g/d  Fluid:  (25-6630ml/kg) 1700-205940ml/d  EDUCATION NEEDS:   No education needs identified at this time  MODERATE Care Level  Winnell Bento B. Freida BusmanAllen, RD, LDN [phone] (pager) Weekend/On-Call pager [phone])

## 2015-09-04 NOTE — Progress Notes (Signed)
Pts VSS, no c/o pain, A&O X1, on RA. Pt with poor appetite, encouraged PO intake, NS @75ml /hr initiated and Tele placed. Pt continues to be in Sinus Tachycardia. Family at bedside, will continue to monitor.

## 2015-09-05 DIAGNOSIS — A0472 Enterocolitis due to Clostridium difficile, not specified as recurrent: Secondary | ICD-10-CM

## 2015-09-05 LAB — BASIC METABOLIC PANEL
Anion gap: 4 — ABNORMAL LOW (ref 5–15)
BUN: 16 mg/dL (ref 6–20)
CHLORIDE: 106 mmol/L (ref 101–111)
CO2: 26 mmol/L (ref 22–32)
Calcium: 7.4 mg/dL — ABNORMAL LOW (ref 8.9–10.3)
Creatinine, Ser: 0.87 mg/dL (ref 0.44–1.00)
GFR calc Af Amer: >60 mL/min (ref >60)
GFR calc non Af Amer: 55 mL/min — ABNORMAL LOW (ref >60)
GLUCOSE: 104 mg/dL — AB (ref 65–99)
POTASSIUM: 4.4 mmol/L (ref 3.5–5.1)
Sodium: 136 mmol/L (ref 135–145)

## 2015-09-05 LAB — CBC
HEMATOCRIT: 28.2 % — AB (ref 35.0–47.0)
HEMOGLOBIN: 8.8 g/dL — AB (ref 12.0–16.0)
MCH: 23.9 pg — AB (ref 26.0–34.0)
MCHC: 31.1 g/dL — ABNORMAL LOW (ref 32.0–36.0)
MCV: 76.9 fL — AB (ref 80.0–100.0)
PLATELETS: 218 10*3/uL (ref 150–440)
RBC: 3.66 MIL/uL — AB (ref 3.80–5.20)
RDW: 19.4 % — ABNORMAL HIGH (ref 11.5–14.5)
WBC: 7.2 10*3/uL (ref 3.6–11.0)

## 2015-09-05 MED ORDER — GUAIFENESIN-CODEINE 100-10 MG/5ML PO SOLN: 5.0000 mL | Freq: Four times a day (QID) | ORAL | Status: DC

## 2015-09-05 MED ORDER — TRAMADOL HCL 50 MG PO TABS: 25.0000 mg | ORAL_TABLET | Freq: Two times a day (BID) | ORAL | Status: DC | PRN

## 2015-09-05 MED ORDER — BENZONATATE 100 MG PO CAPS: 100.0000 mg | ORAL_CAPSULE | Freq: Three times a day (TID) | ORAL | Status: DC

## 2015-09-05 MED ORDER — ENSURE ENLIVE PO LIQD: 237.0000 mL | Freq: Two times a day (BID) | ORAL | Status: DC

## 2015-09-05 MED ORDER — VANCOMYCIN 50 MG/ML ORAL SOLUTION: 125.0000 mg | Freq: Four times a day (QID) | ORAL | Status: AC

## 2015-09-05 MED ORDER — CYANOCOBALAMIN 1000 MCG PO TABS: 1000.0000 ug | ORAL_TABLET | Freq: Every day | ORAL | Status: DC

## 2015-09-05 MED ORDER — RISAQUAD PO CAPS: 1.0000 | ORAL_CAPSULE | Freq: Two times a day (BID) | ORAL | Status: DC

## 2015-09-05 MED ORDER — ENOXAPARIN SODIUM 40 MG/0.4ML ~~LOC~~ SOLN: 40.0000 mg | SUBCUTANEOUS | Status: DC

## 2015-09-05 NOTE — Progress Notes (Signed)
Anticoagulation Monitoring  82 y/o F with C difficile colitis on Lovenox 30 mg daily for DVT prophylaxis.   CrCl= 35 ml/min  Due to improvement in CrCl, will increase Lovenox to 40 mg daily per anticoagulation policy.   Clarisa Schoolsrystal Oluwanifemi Susman, PharmD Clinical Pharmacist 09/05/2015

## 2015-09-05 NOTE — Evaluation (Signed)
Clinical/Bedside Swallow Evaluation Patient Details  Name: Melissa Cooper MRN: [id number] Date of Birth: 06/08/1918  Today's Date: 09/05/2015 Time: SLP Start Time (ACUTE ONLY): 1200 SLP Stop Time (ACUTE ONLY): 1300 SLP Time Calculation (min) (ACUTE ONLY): 60 min  Past Medical History:  Past Medical History  Diagnosis Date  . CKD (chronic kidney disease)     stage 3  . Hypercholesteremia   . HTN (hypertension)   . OSA (obstructive sleep apnea)   . PA (pernicious anemia)   . Osteoarthritis   . Dementia    Past Surgical History:  Past Surgical History  Procedure Laterality Date  . None     HPI:  Pt is a 82 y.o. female with a known history of Dementia, OSA, OA, hypertension, hyperlipidemia, arthritis, chronic anemia and CK D stage III presents from [facility] secondary to worsening diarrhea for 3 days now. Patient has been a resident at [facility] for almost 10 months now. According to sounds, patient is bed bound at baseline and can get up with assistance. She has been doing fine up until 3 days ago when she started to have loose watery stools. Antidiarrheal medicines did not improve, she also received fluid bolus over the last 24 hours with no significant improvement in her diarrhea. She also started complaining of nausea, decreased by mouth intake and also lower abdominal pain. She is sent over to the emergency room. She is noted to be tachycardic. No fevers or chills noted. White count is elevated greater than 34,000. Stool for C. difficile is positive. She is currently on a regular diet(soft) per MD w/ reported coughing when drinking liquids x2 days per NSG staff. Pt denied any trouble swallowing but acknowledged she "strangled" when asked more about it.    Assessment / Plan / Recommendation Clinical Impression  Pt appeared to exhibit overt s/s of aspiration during trials of thin liquids despite general aspiration precautions; adequate toleration of trials of  Nectar consistency liquids w/ no overt s/s of aspiration was noted following the trials. Same adequate toleration was noted w/ trials of puree and soft solids. No gross oral phase deficits noted w/ po trials given. Of note, pt exhibited moderate belching po trials of liquids. Pt required assistance w/ feeding sec. to weakness overall. Pt appears at increased risk for aspiration moreso w/ thin liquids. Suspect this could be impacted by her current medical/respiratory illness and overall weakness as well as impacted by her Cognitive decline. Rec. a dysphagia diet at this time in order to reduce risks and rec. f/u by ST services at SNF in order to conitnue to assess for diet upgrade when appropriate. Son educated on recs. and agreed. NSG updated.      Aspiration Risk  Mild aspiration risk (-moderate risk)    Diet Recommendation  Dys. 3 w/ Nectar liquids; aspiration precautions; assistance at all meals.  Medication Administration: Whole meds with puree    Other  Recommendations Recommended Consults:  (TBD) Oral Care Recommendations: Oral care BID;Staff/trained caregiver to provide oral care Other Recommendations: Order thickener from pharmacy;Prohibited food (jello, ice cream, thin soups);Remove water pitcher   Follow up Recommendations  Skilled Nursing facility (ST)    Frequency and Duration min 3x week  1 week       Prognosis Prognosis for Safe Diet Advancement: Fair Barriers to Reach Goals: Cognitive deficits      Swallow Study   General Date of Onset: 08/29/15 HPI: Pt is a 82 y.o. female with a  known history of Dementia, OSA, OA, hypertension, hyperlipidemia, arthritis, chronic anemia and CK D stage III presents from [facility] secondary to worsening diarrhea for 3 days now. Patient has been a resident at [facility] for almost 10 months now. According to sounds, patient is bed bound at baseline and can get up with assistance. She has been doing fine up until 3 days  ago when she started to have loose watery stools. Antidiarrheal medicines did not improve, she also received fluid bolus over the last 24 hours with no significant improvement in her diarrhea. She also started complaining of nausea, decreased by mouth intake and also lower abdominal pain. She is sent over to the emergency room. She is noted to be tachycardic. No fevers or chills noted. White count is elevated greater than 34,000. Stool for C. difficile is positive. She is currently on a regular diet(soft) per MD w/ reported coughing when drinking liquids x2 days per NSG staff. Pt denied any trouble swallowing but acknowledged she "strangled" when asked more about it.  Type of Study: Bedside Swallow Evaluation Previous Swallow Assessment: none Diet Prior to this Study: Regular;Thin liquids Temperature Spikes Noted: No (wbc 7.9, 7.2) Respiratory Status: Room air History of Recent Intubation: No Behavior/Cognition: Alert;Cooperative;Pleasant mood;Requires cueing (Dementia) Oral Cavity Assessment: Within Functional Limits Oral Care Completed by SLP: Yes Oral Cavity - Dentition:  (dentures) Vision: Functional for self-feeding Self-Feeding Abilities: Able to feed self;Needs set up;Needs assist Patient Positioning: Upright in bed Baseline Vocal Quality: Normal Volitional Cough: Strong Volitional Swallow: Able to elicit    Oral/Motor/Sensory Function Overall Oral Motor/Sensory Function: Within functional limits   Ice Chips Ice chips: Within functional limits Presentation: Spoon (fed; 2 trials)   Thin Liquid Thin Liquid: Impaired Presentation: Cup;Self Fed (assisted; 3 trials) Oral Phase Impairments:  (none) Pharyngeal  Phase Impairments: Suspected delayed Swallow;Cough - Immediate (x1/3 trials)    Nectar Thick Nectar Thick Liquid: Within functional limits Presentation: Cup;Straw;Self Fed (assisted; 3 trials)   Honey Thick Honey Thick Liquid: Not tested   Puree Puree: Within functional  limits Presentation: Spoon (fed; 2 trials)   Solid   GO    Solid: Within functional limits (Soft, moist) Presentation: Spoon (fed; 2 trials)       Jerilynn Som, MS, CCC-SLP  Watson,Melissa Cooper 09/05/2015,2:31 PM

## 2015-09-05 NOTE — Plan of Care (Signed)
Problem: SLP Dysphagia Goals Goal: Misc Dysphagia Goal Pt will safely tolerate po diet of least restrictive consistency w/ no overt s/s of aspiration noted by Staff/pt/family x3 sessions.    

## 2015-09-05 NOTE — Progress Notes (Signed)
Pt alert and oriented. VSS. Report called to [facility] @1403 . EMS called @1404  awaiting arrival. Notified pt Son's of discharge via EMS. Concerns addressed.

## 2015-09-05 NOTE — Discharge Summary (Addendum)
[facility] - [place] at [facility]  DISCHARGE SUMMARY   PATIENT NAME: Melissa Cooper    MR#:  [id number]  DATE OF BIRTH:  12-09-1917  DATE OF ADMISSION:  08/29/2015 ADMITTING PHYSICIAN: Enid Baas, MD  DATE OF DISCHARGE: 09/04/2014  PRIMARY CARE PHYSICIAN: Tillman Abide, MD    ADMISSION DIAGNOSIS:  Clostridium difficile colitis [A04.7] Sepsis, due to unspecified organism (HCC) [A41.9] Diarrhea, unspecified type [R19.7]  DISCHARGE DIAGNOSIS:  Active Problems:   Sepsis (HCC)   Pressure ulcer   Clostridium difficile colitis   SECONDARY DIAGNOSIS:   Past Medical History  Diagnosis Date  . CKD (chronic kidney disease)     stage 3  . Hypercholesteremia   . HTN (hypertension)   . OSA (obstructive sleep apnea)   . PA (pernicious anemia)   . Osteoarthritis   . Dementia     HOSPITAL COURSE:   Melissa Cooper is a 82 y.o. female with a known history of dementia, hypertension, hyperlipidemia, arthritis, chronic anemia and CK D stage III presents from [facility] secondary to worsening diarrhea for 3 days now.  #1 sepsis--Secondary to Clostridium difficile colitis. -CT of the abdomen with diffuse colitis changes noted on admission. -on soft diet - tolerating well. -Continue on oral vancomycin. Improving WBC count, decreasing stool frequency. Continue to complete 14 day course, stop date 09/13/15 -Contact isolation for C. difficile. -Also on probiotics.  #2 Dry cough-dry cough with diffuse wheezing: -Could be reactive airway disease. -CT of the chest with increased right hilar vasculature, no infiltrates noted. No effusion noted. - speech therapy to see and give diet recommendations if needed. Aspiration precautions should be in place. - continue cough suppressants as needed  #3 hypertension: not hypertensive during admission.  #4 pernicious anemia history-on B12 injections prior to admission. Have started oral daily  replacement.  #5 CK D stage III-stable. Continue to monitor weekly BMP.  #6 dysphasia: Will need speech therapy at SNF. Stage 3 dysphagia diet, meds in apple sauce, nectar thick liquids, aspiration precautions.   DISCHARGE CONDITIONS:   stable  CONSULTS OBTAINED:     DRUG ALLERGIES:   Allergies  Allergen Reactions  . Actonel [Risedronate Sodium]   . Ceftin [Cefuroxime Axetil]   . Evista [Raloxifene]   . Miacalcin [Calcitonin (Salmon)]   . Zyrtec [Cetirizine]     DISCHARGE MEDICATIONS:   Current Discharge Medication List    START taking these medications   Details  acidophilus (RISAQUAD) CAPS capsule Take 1 capsule by mouth 2 (two) times daily. Qty: 60 capsule, Refills: 3    benzonatate (TESSALON) 100 MG capsule Take 1 capsule (100 mg total) by mouth 3 (three) times daily. Qty: 20 capsule, Refills: 0    feeding supplement, ENSURE ENLIVE, (ENSURE ENLIVE) LIQD Take 237 mLs by mouth 2 (two) times daily between meals. Qty: 237 mL, Refills: 12    guaiFENesin-codeine 100-10 MG/5ML syrup Take 5 mLs by mouth 4 (four) times daily. Qty: 120 mL, Refills: 0    traMADol (ULTRAM) 50 MG tablet Take 0.5 tablets (25 mg total) by mouth every 12 (twelve) hours as needed (As needed for Mod-severe pain). Qty: 30 tablet, Refills: 0    vancomycin (VANCOCIN) 50 mg/mL oral solution Take 2.5 mLs (125 mg total) by mouth every 6 (six) hours. Qty: 4000 mL, Refills: 0    vitamin B-12 1000 MCG tablet Take 1 tablet (1,000 mcg total) by mouth daily. Qty: 30 tablet, Refills: 0      CONTINUE these medications  which have NOT CHANGED   Details  acetaminophen (TYLENOL ARTHRITIS PAIN) 650 MG CR tablet Take 650 mg by mouth 3 (three) times daily.    aspirin 81 MG chewable tablet Chew 81 mg by mouth daily.    ferrous fumarate (HEMOCYTE - 106 MG FE) 325 (106 Fe) MG TABS tablet Take 1 tablet by mouth daily.      STOP taking these medications     acetaminophen (TYLENOL) 325 MG tablet      alum &  mag hydroxide-simeth (MAALOX/MYLANTA) 200-200-20 MG/5ML suspension          DISCHARGE INSTRUCTIONS:    DIET:  Regular diet  DISCHARGE CONDITION:  Fair  ACTIVITY:  Activity as tolerated  OXYGEN:  Home Oxygen: No.   Oxygen Delivery: room air  DISCHARGE LOCATION:  nursing home   If you experience worsening of your admission symptoms, develop shortness of breath, life threatening emergency, suicidal or homicidal thoughts you must seek medical attention immediately by calling 911 or calling your MD immediately  if symptoms less severe.  You Must read complete instructions/literature along with all the possible adverse reactions/side effects for all the Medicines you take and that have been prescribed to you. Take any new Medicines after you have completely understood and accpet all the possible adverse reactions/side effects.   Please note  You were cared for by a hospitalist during your hospital stay. If you have any questions about your discharge medications or the care you received while you were in the hospital after you are discharged, you can call the unit and asked to speak with the hospitalist on call if the hospitalist that took care of you is not available. Once you are discharged, your primary care physician will handle any further medical issues. Please note that NO REFILLS for any discharge medications will be authorized once you are discharged, as it is imperative that you return to your primary care physician (or establish a relationship with a primary care physician if you do not have one) for your aftercare needs so that they can reassess your need for medications and monitor your lab values.     Today   CHIEF COMPLAINT:   Chief Complaint  Patient presents with  . Diarrhea    HISTORY OF PRESENT ILLNESS:  Melissa Cooper is a 82 y.o. female with a known history of dementia, hypertension, hyperlipidemia, arthritis, chronic anemia and CK D stage III presents from  [facility] secondary to worsening diarrhea for 3 days now. Patient has been a resident at [facility] for almost 10 months now. According to sounds, patient is bed bound at baseline and can get up with assistance. She has been doing fine up until 3 days ago when she started to have loose watery stools. Antidiarrheal medicines did not improve, she also received fluid bolus over the last 24 hours with no significant improvement in her diarrhea. She also started complaining of nausea, decreased by mouth intake and also lower abdominal pain. She is sent over to the emergency room. She is noted to be tachycardic. No fevers or chills noted. White count is elevated greater than 34,000. Stool for C. difficile is positive. No recent antibiotic use noted.   VITAL SIGNS:  Blood pressure 163/78, pulse 100, temperature 97.8 F (36.6 C), temperature source Oral, resp. rate 16, height 5\' 2"  (1.575 m), weight 68.085 kg (150 lb 1.6 oz), SpO2 94 %.  I/O:   Intake/Output Summary (Last 24 hours) at 09/05/15 1035 Last  data filed at 09/05/15 0600  Gross per 24 hour  Intake 1248.79 ml  Output      0 ml  Net 1248.79 ml    PHYSICAL EXAMINATION:  GENERAL: 82 y.o.-year-old elderly patient lying in the bed with no acute distress.  EYES: Pupils equal, round, reactive to light and accommodation. No scleral icterus. Extraocular muscles intact.  HEENT: Head atraumatic, normocephalic. Oropharynx and nasopharynx clear.  NECK: Supple, no jugular venous distention. No thyroid enlargement, no tenderness.  LUNGS: no wheezing today, no rales,rhonchi or crepitation. No use of accessory muscles of respiration.  CARDIOVASCULAR: S1, S2 normal. No rubs, or gallops. 3/6 systolic murmur is present ABDOMEN: Soft, nontender, nondistended. Bowel sounds present. No organomegaly or mass.  EXTREMITIES: No pedal edema, cyanosis, or clubbing. Pulses 2+ NEUROLOGIC: Cranial nerves II through XII are intact. Muscle  strength 5/5 in all extremities. Sensation intact. Gait not checked.  PSYCHIATRIC: The patient is alert and oriented x 3.  SKIN: No obvious rash, lesion, or ulcer.   DATA REVIEW:   CBC  Recent Labs Lab 09/05/15 0449  WBC 7.2  HGB 8.8*  HCT 28.2*  PLT 218    Chemistries   Recent Labs Lab 08/29/15 1320  09/05/15 0449  NA 135  < > 136  K 4.0  < > 4.4  CL 102  < > 106  CO2 24  < > 26  GLUCOSE 133*  < > 104*  BUN 37*  < > 16  CREATININE 1.39*  < > 0.87  CALCIUM 8.4*  < > 7.4*  AST 12*  --   --   ALT 8*  --   --   ALKPHOS 84  --   --   BILITOT 0.5  --   --   < > = values in this interval not displayed.  Cardiac Enzymes No results for input(s): TROPONINI in the last 168 hours.  Microbiology Results  Results for orders placed or performed during the hospital encounter of 08/29/15  Gastrointestinal Panel by PCR , Stool     Status: None   Collection Time: 08/29/15  1:21 PM  Result Value Ref Range Status   Campylobacter species NOT DETECTED NOT DETECTED Final   Plesimonas shigelloides NOT DETECTED NOT DETECTED Final   Salmonella species NOT DETECTED NOT DETECTED Final   Yersinia enterocolitica NOT DETECTED NOT DETECTED Final   Vibrio species NOT DETECTED NOT DETECTED Final   Vibrio cholerae NOT DETECTED NOT DETECTED Final   Enteroaggregative E coli (EAEC) NOT DETECTED NOT DETECTED Final   Enteropathogenic E coli (EPEC) NOT DETECTED NOT DETECTED Final   Enterotoxigenic E coli (ETEC) NOT DETECTED NOT DETECTED Final   Shiga like toxin producing E coli (STEC) NOT DETECTED NOT DETECTED Final   E. coli O157 NOT DETECTED NOT DETECTED Final   Shigella/Enteroinvasive E coli (EIEC) NOT DETECTED NOT DETECTED Final   Cryptosporidium NOT DETECTED NOT DETECTED Final   Cyclospora cayetanensis NOT DETECTED NOT DETECTED Final   Entamoeba histolytica NOT DETECTED NOT DETECTED Final   Giardia lamblia NOT DETECTED NOT DETECTED Final   Adenovirus F40/41 NOT DETECTED NOT DETECTED Final    Astrovirus NOT DETECTED NOT DETECTED Final   Norovirus GI/GII NOT DETECTED NOT DETECTED Final   Rotavirus A NOT DETECTED NOT DETECTED Final   Sapovirus (I, II, IV, and V) NOT DETECTED NOT DETECTED Final  C difficile quick scan w PCR reflex     Status: Abnormal   Collection Time: 08/29/15  1:21 PM  Result Value  Ref Range Status   C Diff antigen POSITIVE (A) NEGATIVE Final   C Diff toxin POSITIVE (A) NEGATIVE Final   C Diff interpretation   Final    Positive for toxigenic C. difficile, active toxin production present.    Comment: CRITICAL RESULT CALLED TO, READ BACK BY AND VERIFIED WITH: NOTIFIED KENDAL MOFFIT ON 08/29/15 AT 1522 [facility]   MRSA PCR Screening     Status: None   Collection Time: 08/30/15  4:02 AM  Result Value Ref Range Status   MRSA by PCR NEGATIVE NEGATIVE Final    Comment:        The GeneXpert MRSA Assay (FDA approved for NASAL specimens only), is one component of a comprehensive MRSA colonization surveillance program. It is not intended to diagnose MRSA infection nor to guide or monitor treatment for MRSA infections.     RADIOLOGY:  No results found.  EKG:   Orders placed or performed during the hospital encounter of 08/29/15  . ED EKG  . ED EKG  . EKG 12-Lead  . EKG 12-Lead      Management plans discussed with the patient, family and they are in agreement.  CODE STATUS:     Code Status Orders        Start     Ordered   08/29/15 1836  Do not attempt resuscitation (DNR)   Continuous    Question Answer Comment  In the event of cardiac or respiratory ARREST Do not call a "code blue"   In the event of cardiac or respiratory ARREST Do not perform Intubation, CPR, defibrillation or ACLS   In the event of cardiac or respiratory ARREST Use medication by any route, position, wound care, and other measures to relive pain and suffering. May use oxygen, suction and manual treatment of airway obstruction as needed for comfort.      08/29/15 1835     Advance Directive Documentation        Most Recent Value   Type of Advance Directive  Healthcare Power of Attorney   Pre-existing out of facility DNR order (yellow form or pink MOST form)     "MOST" Form in Place?        TOTAL TIME TAKING CARE OF THIS PATIENT: 45 minutes.  Greater than 50% of time spent in care coordination and counseling.  Elby ShowersWALSH, CATHERINE M.D on 09/05/2015 at 10:35 AM  Between 7am to 6pm - Pager - [phone]  After 6pm go to [email or web address] - password [email or web address] [facility]  [facility]  Office  [phone]  CC: Primary care physician; Tillman Abideichard Letvak, MD

## 2015-09-05 NOTE — Progress Notes (Signed)
EMS arrived and picked pt up. Brief changed and transfer gown placed on pt. Pt transferred to [facility].

## 2015-09-05 NOTE — Care Management Important Message (Signed)
Important Message  Patient Details  Name: Hezzie Bumpllene L Chandley MRN: [id number] Date of Birth: 12/13/1917   Medicare Important Message Given:  Yes    Adonis HugueninBerkhead, Shatera Rennert L, RN 09/05/2015, 10:49 AM

## 2015-09-06 ENCOUNTER — Telehealth: Payer: Self-pay | Admitting: Family Medicine

## 2015-09-06 NOTE — Telephone Encounter (Signed)
[place] on call physician  Patient returned from [facility]. New medicines include vancomycin oral, codeine cough syrup, and tessalon. She was septic from C. Diff. Nurse notes mild redness in face in upper chest and concerned for rash though notes it is very hot in the building.   I advised stop codeine and tessalon. Nurse is hesitant to do so since it seems to make patient more comfortable. She is going to watch patient and hopefully the heat can be adjusted down to see if persists.   I was hesitant to stop vancomycin given recent sepsis and the fact that she has been on it for at least 5 days already (wheras time course on codeine and tessalon are more likely)  If rash persists- would stop those and then call Dr. Alphonsus SiasLetvak Monday. Consideration would be to change to flagyl.

## 2015-09-07 NOTE — Telephone Encounter (Signed)
[facility]

## 2015-09-08 ENCOUNTER — Telehealth: Payer: Self-pay | Admitting: *Deleted

## 2015-09-08 DIAGNOSIS — R05 Cough: Secondary | ICD-10-CM | POA: Diagnosis not present

## 2015-09-08 DIAGNOSIS — A047 Enterocolitis due to Clostridium difficile: Secondary | ICD-10-CM | POA: Diagnosis not present

## 2015-09-08 NOTE — Telephone Encounter (Signed)
PLEASE NOTE: All timestamps contained within this report are represented as [place] Standard Time. CONFIDENTIALTY NOTICE: This fax transmission is intended only for the addressee. It contains information that is legally privileged, confidential or otherwise protected from use or disclosure. If you are not the intended recipient, you are strictly prohibited from reviewing, disclosing, copying using or disseminating any of this information or taking any action in reliance on or regarding this information. If you have received this fax in error, please notify us immediately by telephone so that we can arrange for its return to us. Phone: [phone], Toll-Free: [phone], Fax: [phone] Page: 1 of 1 Call Id: [id number] [facility] Client TELEPHONE ADVICE RECORD [facility] Patient Name: Melissa FlakesLLENE Rosekrans Gender: Female DOB: 09/25/1917 Age: [age] Y 1 M Return Phone Number: Address: City/State/Zip: [place] [facility] Physician Tillman AbideLetvak, Richard Contact Type Call Call Type Page Only Caller Name Judeth CornfieldStephanie [phone] Is this call to report lab results? No Return Phone Number Please choose phone number Initial Comment [facility]. Dr. is Alphonsus SiasLetvak and his pt. Melissa FlakesAllene Viar [birth year], She is on new medication and getting a rash; was put on new medication per discharge from the hospital yesterday. Nurse Assessment Guidelines Guideline Title Affirmed Question Affirmed Notes Nurse Date/Time (Eastern Time) Disp. Time Lamount Cohen(Eastern Time) Disposition Final User 09/06/2015 9:24:27 PM [facility] P[facility]ue Nadene Rubinsnderson, Paula 09/06/2015 9:24:36 PM [facility] P[facility]ue Nadene Rubinsnderson, Paula 09/06/2015 9:31:51 PM Paged On Call back to Call Center - PC Leeroy ChaCowan, Joseph 09/06/2015 9:48:34 PM Called On-Call Provider Leeroy ChaCowan, Joseph 09/06/2015 9:50:04 PM  Paged On Call back to Call Center - PC Leeroy ChaCowan, Joseph 09/06/2015 9:58:39 PM Page Completed Yes Leeroy Chaowan, Joseph After Care Instructions Given Call Event Type User Date / Time Description Paging DoctorName Phone DateTime Result/Outcome Message Type Notes Tana ConchHunter, Stephen [phone] 09/06/2015 9:31:51 PM Paged On Call Back to Call Center Doctor Paged Tana ConchHunter, Stephen [phone] 09/06/2015 9:48:34 PM Called On Call Provider - Reached Doctor Paged Tana ConchHunter, Stephen 09/06/2015 9:58:24 PM Spoke with On Call - General Message Result Dr Durene CalHunter returned paged; relayed call information and connected the MD w/ the caller;

## 2015-09-08 NOTE — Telephone Encounter (Signed)
I checked on her today

## 2015-09-10 ENCOUNTER — Ambulatory Visit: Payer: Medicare Other | Admitting: Internal Medicine

## 2015-09-28 ENCOUNTER — Emergency Department: Payer: Medicare Other

## 2015-09-28 ENCOUNTER — Inpatient Hospital Stay
Admission: EM | Admit: 2015-09-28 | Discharge: 2015-10-06 | DRG: 371 | Disposition: A | Discharge status: Skilled Nursing Facility | Payer: Medicare Other | Attending: Internal Medicine | Admitting: Internal Medicine

## 2015-09-28 ENCOUNTER — Encounter: Payer: Self-pay | Admitting: Intensive Care

## 2015-09-28 DIAGNOSIS — J9811 Atelectasis: Secondary | ICD-10-CM | POA: Diagnosis present

## 2015-09-28 DIAGNOSIS — Z6824 Body mass index (BMI) 24.0-24.9, adult: Secondary | ICD-10-CM | POA: Diagnosis not present

## 2015-09-28 DIAGNOSIS — Z888 Allergy status to other drugs, medicaments and biological substances status: Secondary | ICD-10-CM | POA: Diagnosis not present

## 2015-09-28 DIAGNOSIS — M1990 Unspecified osteoarthritis, unspecified site: Secondary | ICD-10-CM | POA: Diagnosis present

## 2015-09-28 DIAGNOSIS — Z66 Do not resuscitate: Secondary | ICD-10-CM | POA: Diagnosis present

## 2015-09-28 DIAGNOSIS — R32 Unspecified urinary incontinence: Secondary | ICD-10-CM | POA: Diagnosis present

## 2015-09-28 DIAGNOSIS — E43 Unspecified severe protein-calorie malnutrition: Secondary | ICD-10-CM | POA: Diagnosis present

## 2015-09-28 DIAGNOSIS — N183 Chronic kidney disease, stage 3 (moderate): Secondary | ICD-10-CM | POA: Diagnosis present

## 2015-09-28 DIAGNOSIS — I129 Hypertensive chronic kidney disease with stage 1 through stage 4 chronic kidney disease, or unspecified chronic kidney disease: Secondary | ICD-10-CM | POA: Diagnosis present

## 2015-09-28 DIAGNOSIS — D509 Iron deficiency anemia, unspecified: Secondary | ICD-10-CM | POA: Diagnosis present

## 2015-09-28 DIAGNOSIS — E876 Hypokalemia: Secondary | ICD-10-CM | POA: Diagnosis present

## 2015-09-28 DIAGNOSIS — G4733 Obstructive sleep apnea (adult) (pediatric): Secondary | ICD-10-CM | POA: Diagnosis present

## 2015-09-28 DIAGNOSIS — F0390 Unspecified dementia without behavioral disturbance: Secondary | ICD-10-CM | POA: Diagnosis present

## 2015-09-28 DIAGNOSIS — Z79899 Other long term (current) drug therapy: Secondary | ICD-10-CM

## 2015-09-28 DIAGNOSIS — Z7982 Long term (current) use of aspirin: Secondary | ICD-10-CM

## 2015-09-28 DIAGNOSIS — A047 Enterocolitis due to Clostridium difficile: Secondary | ICD-10-CM | POA: Diagnosis present

## 2015-09-28 DIAGNOSIS — K2980 Duodenitis without bleeding: Secondary | ICD-10-CM | POA: Diagnosis present

## 2015-09-28 DIAGNOSIS — K922 Gastrointestinal hemorrhage, unspecified: Secondary | ICD-10-CM

## 2015-09-28 DIAGNOSIS — J189 Pneumonia, unspecified organism: Secondary | ICD-10-CM

## 2015-09-28 DIAGNOSIS — A0472 Enterocolitis due to Clostridium difficile, not specified as recurrent: Secondary | ICD-10-CM

## 2015-09-28 DIAGNOSIS — A0471 Enterocolitis due to Clostridium difficile, recurrent: Secondary | ICD-10-CM | POA: Diagnosis present

## 2015-09-28 LAB — CBC
HCT: 25.9 % — ABNORMAL LOW (ref 35.0–47.0)
Hemoglobin: 8 g/dL — ABNORMAL LOW (ref 12.0–16.0)
MCH: 23.6 pg — AB (ref 26.0–34.0)
MCHC: 30.9 g/dL — ABNORMAL LOW (ref 32.0–36.0)
MCV: 76.4 fL — AB (ref 80.0–100.0)
PLATELETS: 380 10*3/uL (ref 150–440)
RBC: 3.39 MIL/uL — ABNORMAL LOW (ref 3.80–5.20)
RDW: 20.1 % — AB (ref 11.5–14.5)
WBC: 14.9 10*3/uL — AB (ref 3.6–11.0)

## 2015-09-28 LAB — URINALYSIS COMPLETE WITH MICROSCOPIC (ARMC ONLY)
BILIRUBIN URINE: NEGATIVE
GLUCOSE, UA: NEGATIVE mg/dL
Hgb urine dipstick: NEGATIVE
LEUKOCYTES UA: NEGATIVE
NITRITE: NEGATIVE
Protein, ur: 30 mg/dL — AB
SPECIFIC GRAVITY, URINE: 1.025 (ref 1.005–1.030)
pH: 5 (ref 5.0–8.0)

## 2015-09-28 LAB — COMPREHENSIVE METABOLIC PANEL
ALT: 7 U/L — ABNORMAL LOW (ref 14–54)
AST: 13 U/L — ABNORMAL LOW (ref 15–41)
Albumin: 2.4 g/dL — ABNORMAL LOW (ref 3.5–5.0)
Alkaline Phosphatase: 73 U/L (ref 38–126)
Anion gap: 9 (ref 5–15)
BUN: 28 mg/dL — ABNORMAL HIGH (ref 6–20)
CHLORIDE: 101 mmol/L (ref 101–111)
CO2: 29 mmol/L (ref 22–32)
Calcium: 7.8 mg/dL — ABNORMAL LOW (ref 8.9–10.3)
Creatinine, Ser: 0.96 mg/dL (ref 0.44–1.00)
GFR, EST AFRICAN AMERICAN: 56 mL/min — AB (ref >60)
GFR, EST NON AFRICAN AMERICAN: 49 mL/min — AB (ref >60)
Glucose, Bld: 123 mg/dL — ABNORMAL HIGH (ref 65–99)
POTASSIUM: 3.3 mmol/L — AB (ref 3.5–5.1)
SODIUM: 139 mmol/L (ref 135–145)
Total Bilirubin: 0.8 mg/dL (ref 0.3–1.2)
Total Protein: 5.6 g/dL — ABNORMAL LOW (ref 6.5–8.1)

## 2015-09-28 LAB — C DIFFICILE QUICK SCREEN W PCR REFLEX
C DIFFICLE (CDIFF) ANTIGEN: POSITIVE — AB
C Diff interpretation: POSITIVE
C Diff toxin: POSITIVE — AB

## 2015-09-28 LAB — LIPASE, BLOOD: LIPASE: 20 U/L (ref 11–51)

## 2015-09-28 MED ORDER — IOHEXOL 240 MG/ML SOLN
25.0000 mL | Freq: Once | INTRAMUSCULAR | Status: AC | PRN
  Administered 2015-09-28: 25 mL via ORAL

## 2015-09-28 MED ORDER — FERROUS FUMARATE 325 (106 FE) MG PO TABS
106.0000 mg | ORAL_TABLET | Freq: Every day | ORAL | Status: DC
  Administered 2015-09-28 – 2015-10-06 (×9): 106 mg via ORAL
  Filled 2015-09-28 (×10): qty 1

## 2015-09-28 MED ORDER — VITAMIN B-12 1000 MCG PO TABS
1000.0000 ug | ORAL_TABLET | Freq: Every day | ORAL | Status: DC
  Administered 2015-09-28 – 2015-10-06 (×9): 1000 ug via ORAL
  Filled 2015-09-28 (×9): qty 1

## 2015-09-28 MED ORDER — ACETAMINOPHEN 650 MG RE SUPP: 650.0000 mg | Freq: Four times a day (QID) | RECTAL | Status: DC | PRN

## 2015-09-28 MED ORDER — ONDANSETRON HCL 4 MG/2ML IJ SOLN
4.0000 mg | Freq: Once | INTRAMUSCULAR | Status: AC
  Administered 2015-09-28: 4 mg via INTRAVENOUS
  Filled 2015-09-28: qty 2

## 2015-09-28 MED ORDER — POTASSIUM CHLORIDE 10 MEQ/100ML IV SOLN
10.0000 meq | INTRAVENOUS | Status: AC
  Administered 2015-09-28 (×4): 10 meq via INTRAVENOUS
  Filled 2015-09-28 (×4): qty 100

## 2015-09-28 MED ORDER — ASPIRIN 81 MG PO CHEW
81.0000 mg | CHEWABLE_TABLET | Freq: Every day | ORAL | Status: DC
  Administered 2015-09-28 – 2015-10-02 (×5): 81 mg via ORAL
  Filled 2015-09-28 (×5): qty 1

## 2015-09-28 MED ORDER — MORPHINE SULFATE (PF) 2 MG/ML IV SOLN
2.0000 mg | Freq: Once | INTRAVENOUS | Status: AC
Start: 2015-09-28 — End: 2015-09-28
  Administered 2015-09-28: 2 mg via INTRAVENOUS
  Filled 2015-09-28: qty 1

## 2015-09-28 MED ORDER — BENZONATATE 100 MG PO CAPS: 100.0000 mg | ORAL_CAPSULE | Freq: Three times a day (TID) | ORAL | Status: DC | PRN

## 2015-09-28 MED ORDER — ENSURE ENLIVE PO LIQD: 237.0000 mL | Freq: Two times a day (BID) | ORAL | Status: DC

## 2015-09-28 MED ORDER — VANCOMYCIN HCL IN DEXTROSE 1-5 GM/200ML-% IV SOLN
1000.0000 mg | Freq: Once | INTRAVENOUS | Status: DC
  Filled 2015-09-28: qty 200

## 2015-09-28 MED ORDER — ONDANSETRON HCL 4 MG PO TABS: 4.0000 mg | ORAL_TABLET | Freq: Four times a day (QID) | ORAL | Status: DC | PRN

## 2015-09-28 MED ORDER — LEVOFLOXACIN IN D5W 750 MG/150ML IV SOLN
750.0000 mg | Freq: Once | INTRAVENOUS | Status: AC
  Administered 2015-09-28: 750 mg via INTRAVENOUS
  Filled 2015-09-28: qty 150

## 2015-09-28 MED ORDER — RISAQUAD PO CAPS
1.0000 | ORAL_CAPSULE | Freq: Two times a day (BID) | ORAL | Status: DC
  Administered 2015-09-28 – 2015-10-06 (×16): 1 via ORAL
  Filled 2015-09-28 (×16): qty 1

## 2015-09-28 MED ORDER — ENOXAPARIN SODIUM 30 MG/0.3ML ~~LOC~~ SOLN
30.0000 mg | SUBCUTANEOUS | Status: DC
  Administered 2015-09-28 – 2015-09-30 (×3): 30 mg via SUBCUTANEOUS
  Filled 2015-09-28 (×3): qty 0.3

## 2015-09-28 MED ORDER — SODIUM CHLORIDE 0.9 % IV SOLN
INTRAVENOUS | Status: DC
  Administered 2015-09-28 – 2015-09-29 (×2): via INTRAVENOUS

## 2015-09-28 MED ORDER — TRAMADOL HCL 50 MG PO TABS
50.0000 mg | ORAL_TABLET | ORAL | Status: DC | PRN
  Administered 2015-09-28 – 2015-10-03 (×6): 50 mg via ORAL
  Filled 2015-09-28 (×6): qty 1

## 2015-09-28 MED ORDER — SODIUM CHLORIDE 0.9 % IV BOLUS (SEPSIS)
1000.0000 mL | Freq: Once | INTRAVENOUS | Status: AC
Start: 2015-09-28 — End: 2015-09-28
  Administered 2015-09-28: 1000 mL via INTRAVENOUS

## 2015-09-28 MED ORDER — IOHEXOL 300 MG/ML  SOLN
75.0000 mL | Freq: Once | INTRAMUSCULAR | Status: AC | PRN
  Administered 2015-09-28: 75 mL via INTRAVENOUS

## 2015-09-28 MED ORDER — METRONIDAZOLE IN NACL 5-0.79 MG/ML-% IV SOLN
500.0000 mg | Freq: Three times a day (TID) | INTRAVENOUS | Status: DC
  Administered 2015-09-28 – 2015-10-02 (×13): 500 mg via INTRAVENOUS
  Filled 2015-09-28 (×14): qty 100

## 2015-09-28 MED ORDER — ONDANSETRON HCL 4 MG/2ML IJ SOLN
4.0000 mg | Freq: Four times a day (QID) | INTRAMUSCULAR | Status: DC | PRN
  Administered 2015-09-28 (×2): 4 mg via INTRAVENOUS
  Filled 2015-09-28 (×2): qty 2

## 2015-09-28 MED ORDER — METRONIDAZOLE IN NACL 5-0.79 MG/ML-% IV SOLN
500.0000 mg | Freq: Once | INTRAVENOUS | Status: AC
  Administered 2015-09-28: 500 mg via INTRAVENOUS
  Filled 2015-09-28: qty 100

## 2015-09-28 MED ORDER — ACETAMINOPHEN 325 MG PO TABS: 650.0000 mg | ORAL_TABLET | Freq: Four times a day (QID) | ORAL | Status: DC | PRN

## 2015-09-28 NOTE — Progress Notes (Signed)
Patient ordered Lovenox 40 mg daily for DVT prophylaxis. Due to CrCl < 30 ml/min, will decrease Lovenox dose to 30 mg daily.   Luisa Hart, PharmD Clinical Pharmacist

## 2015-09-28 NOTE — H&P (Signed)
[facility] -  at [facility]   PATIENT NAME: Melissa Cooper    MR#:  [id number]  DATE OF BIRTH:  09/14/1917  DATE OF ADMISSION:  09/28/2015  PRIMARY CARE PHYSICIAN: Tillman Abide, MD   REQUESTING/REFERRING PHYSICIAN: Dr. Minna Antis  CHIEF COMPLAINT:   Chief Complaint  Patient presents with  . Emesis    HISTORY OF PRESENT ILLNESS:  Melissa Cooper  is a 82 y.o. female with a known history of mild to moderate dementia, hypertension, hyperlipidemia, arthritis, chronic anemia and CKD stage III who was recently admitted to the hospital from 08/29/2015 to 09/05/2015 her C. difficile colitis and discharged to [facility] skilled nursing facility is brought back secondary to diarrhea and abdominal pain. Patient was here 3 weeks ago with similar complaints and CT of the abdomen at that time showed pancolitis and she was started on by mouth vancomycin and was discharged on the same. Her appetite has not gotten back to her baseline according to family, her diarrhea and abdominal pain were improved at the time of discharge. She also started having some cough here in the hospital at the time and was changed over to dysphagia diet. Patient hasn't had any episodes of aspiration but has been having some occasional dry cough. She finished her antibiotic no more than 2 weeks ago. For the last 3 days she's been complaining of intermittent abdominal pain especially after eating. Denies any nausea vomiting or hematemesis. She started having diarrhea again since yesterday and was brought to the emergency room. CT of the abdomen again shows diffuse pancolitis and C. difficile is tested positive.  PAST MEDICAL HISTORY:   Past Medical History  Diagnosis Date  . CKD (chronic kidney disease)     stage 3  . Hypercholesteremia   . HTN (hypertension)   . OSA (obstructive sleep apnea)   . PA (pernicious anemia)   . Osteoarthritis   . Dementia   . Osteoarthritis     PAST  SURGICAL HISTORY:   Past Surgical History  Procedure Laterality Date  . None      SOCIAL HISTORY:   Social History  Substance Use Topics  . Smoking status: Never Smoker   . Smokeless tobacco: Not on file  . Alcohol Use: No    FAMILY HISTORY:  History reviewed. No pertinent family history.  DRUG ALLERGIES:   Allergies  Allergen Reactions  . Actonel [Risedronate Sodium] Other (See Comments)    Unknown reaction  . Ceftin [Cefuroxime Axetil] Other (See Comments)    Unknown reaction  . Evista [Raloxifene] Other (See Comments)    Unknown reaction  . Miacalcin [Calcitonin ([place])] Other (See Comments)    Unknown reaction  . Zyrtec [Cetirizine] Other (See Comments)    Unknown reaction    REVIEW OF SYSTEMS:   Review of Systems  Constitutional: Positive for fever and malaise/fatigue. Negative for chills and weight loss.  HENT: Positive for hearing loss. Negative for ear discharge, ear pain and nosebleeds.   Eyes: Positive for blurred vision. Negative for double vision and photophobia.  Respiratory: Positive for cough. Negative for hemoptysis, shortness of breath and wheezing.   Cardiovascular: Negative for chest pain, palpitations, orthopnea and leg swelling.  Gastrointestinal: Positive for abdominal pain and diarrhea. Negative for heartburn, nausea, vomiting, constipation and melena.  Genitourinary: Negative for dysuria and hematuria.  Musculoskeletal: Positive for joint pain. Negative for myalgias, back pain and neck pain.  Skin: Negative for rash.  Neurological: Negative for dizziness, tremors, sensory change, speech  change, focal weakness and headaches.  Endo/Heme/Allergies: Does not bruise/bleed easily.  Psychiatric/Behavioral: Negative for depression.    MEDICATIONS AT HOME:   Prior to Admission medications   Medication Sig Start Date End Date Taking? Authorizing Provider  acetaminophen (TYLENOL ARTHRITIS PAIN) 650 MG CR tablet Take 650 mg by mouth 3 (three) times  daily.   Yes Historical Provider, MD  aspirin 81 MG chewable tablet Chew 81 mg by mouth daily.   Yes Historical Provider, MD  benzonatate (TESSALON) 100 MG capsule Take 1 capsule (100 mg total) by mouth 3 (three) times daily. Patient taking differently: Take 100 mg by mouth 3 (three) times daily as needed for cough.  09/05/15  Yes Gale Journey, MD  bifidobacterium infantis (ALIGN) capsule Take 1 capsule by mouth every morning.   Yes Historical Provider, MD  feeding supplement, ENSURE ENLIVE, (ENSURE ENLIVE) LIQD Take 237 mLs by mouth 2 (two) times daily between meals. 09/05/15  Yes Gale Journey, MD  ferrous fumarate (HEMOCYTE - 106 MG FE) 325 (106 Fe) MG TABS tablet Take 325 mg of iron by mouth daily.    Yes Historical Provider, MD  ondansetron (ZOFRAN) 4 MG tablet Take 4 mg by mouth 3 (three) times daily as needed for nausea or vomiting.   Yes Historical Provider, MD  traMADol (ULTRAM) 50 MG tablet Take 0.5 tablets (25 mg total) by mouth every 12 (twelve) hours as needed (As needed for Mod-severe pain). Patient taking differently: Take 50 mg by mouth every 4 (four) hours as needed for moderate pain or severe pain (As needed for Mod-severe pain).  09/05/15  Yes Gale Journey, MD  vitamin B-12 1000 MCG tablet Take 1 tablet (1,000 mcg total) by mouth daily. 09/05/15  Yes Gale Journey, MD  acidophilus (RISAQUAD) CAPS capsule Take 1 capsule by mouth 2 (two) times daily. 09/05/15   Gale Journey, MD  guaiFENesin-codeine 100-10 MG/5ML syrup Take 5 mLs by mouth 4 (four) times daily. 09/05/15   Gale Journey, MD      VITAL SIGNS:  Blood pressure 110/48, pulse 105, temperature 98.3 F (36.8 C), temperature source Oral, resp. rate 18, height  (1.6 m), weight 62.596 kg (138 lb), SpO2 94 %.  PHYSICAL EXAMINATION:   Physical Exam  GENERAL:  82 y.o.-year-old elderly patient lying in the bed with no acute distress.  EYES: Pupils equal, round, reactive to light and accommodation. No  scleral icterus. Extraocular muscles intact.  HEENT: Head atraumatic, normocephalic. Oropharynx and nasopharynx clear.  NECK:  Supple, no jugular venous distention. No thyroid enlargement, no tenderness.  LUNGS: Normal breath sounds bilaterally, no wheezing, rales,rhonchi or crepitation. No use of accessory muscles of respiration. Decreased bibasilar breath sounds. CARDIOVASCULAR: S1, S2 normal. No rubs, or gallops. 3/6 systolic murmur present. ABDOMEN: Soft, nontender, nondistended. Bowel sounds present. No organomegaly or mass.  EXTREMITIES: No pedal edema, cyanosis, or clubbing.  NEUROLOGIC: Cranial nerves II through XII are intact. Muscle strength 5/5 in all extremities. Sensation intact. Gait not checked. Following simple commands PSYCHIATRIC: The patient is alert and oriented x 2.  SKIN: No obvious rash, lesion, or ulcer.   LABORATORY PANEL:   CBC  Recent Labs Lab 09/28/15 0825  WBC 14.9*  HGB 8.0*  HCT 25.9*  PLT 380   ------------------------------------------------------------------------------------------------------------------  Chemistries   Recent Labs Lab 09/28/15 0825  NA 139  K 3.3*  CL 101  CO2 29  GLUCOSE 123*  BUN 28*  CREATININE 0.96  CALCIUM 7.8*  AST  13*  ALT 7*  ALKPHOS 73  BILITOT 0.8   ------------------------------------------------------------------------------------------------------------------  Cardiac Enzymes No results for input(s): TROPONINI in the last 168 hours. ------------------------------------------------------------------------------------------------------------------  RADIOLOGY:  Ct Abdomen Pelvis W Contrast  09/28/2015  CLINICAL DATA:  82 year old nursing home patient with current history of dementia presenting with acute onset of fever, left upper lobe quadrant and left lower quadrant abdominal pain, diarrhea, nausea and vomiting which began earlier today. EXAM: CT ABDOMEN AND PELVIS WITH CONTRAST TECHNIQUE: Multidetector  CT imaging of the abdomen and pelvis was performed using the standard protocol following bolus administration of intravenous contrast. CONTRAST:  75mL OMNIPAQUE IOHEXOL 300 MG/ML IV. The patient was unable to tolerate oral contrast material, so only a small amount of oral contrast was administered. COMPARISON:  Unenhanced CT abdomen pelvis 08/29/2015. FINDINGS: Lower chest: Small bilateral pleural effusions, new since the prior CT, with associated mild passive atelectasis in the lower lobes. Heart size normal with left atrial enlargement, mitral annular calcifications, aortic annular calcification and aortic valvular calcification. LAD and circumflex coronary artery atherosclerosis. Hepatobiliary: Calcified granuloma involving the dome of the liver. No significant abnormality involving the liver. Gallbladder normal in appearance without calcified gallstones. No biliary ductal dilation. Pancreas: Moderate pancreatic atrophy. No focal parenchymal abnormality. No peripancreatic inflammation. Spleen: Normal in size and appearance. Adrenals/Urinary Tract: Mild bilateral adrenal enlargement without nodularity. Atrophic right kidney. Multiple cortical cysts involving both kidneys, 1 of which in the upper pole of the left kidney was hyperdense on the prior study. No solid mass involving either kidney. No urinary tract calculi. Moderate dilation of the left renal pelvis and mild dilation of the calices in left kidney, unchanged, without associated ureteral dilation. Normal-appearing decompressed urinary bladder. Stomach/Bowel: Large hiatal hernia as noted previously with approximately 2/3 of the stomach intrathoracic. Marked wall thickening involving the duodenal bulb, descending duodenum and proximal transverse duodenum, new since the prior CT. Remaining small bowel unremarkable. Moderate diffuse colonic wall thickening from cecum to rectum, associated with edema/inflammation in the pericolonic fat, progressive since the  prior CT. Sigmoid colon diverticulosis. No abnormal fluid collections. Normal appendix in the right upper pelvis. No ascites. Vascular/Lymphatic: Severe aorto-iliofemoral atherosclerosis without aneurysm. Atherosclerosis at the origins of the celiac, SMA and IMA, associated with stenoses, though all 3 arteries are patent. No visible branch vessel occlusions. Atherosclerosis at the origins of both renal arteries, also with stenosis. No pathologic lymphadenopathy. Reproductive: Atrophic uterus and right ovary. Left ovarian cyst measuring approximately 1.5 x 2.8 x 2.7 cm, decreased in size since the prior CT. Other: Small umbilical hernia containing fat.  Sarcopenia. Musculoskeletal: Severe degenerative disc disease, spondylosis and facet degenerative changes throughout the lower thoracic and lumbar spine with thoracolumbar scoliosis convex right. Osseous demineralization. Symmetric moderate to severe osteoarthritis in both hips. Severe multifactorial spinal stenosis at L3-4 and L4-5. IMPRESSION: 1. Pancolitis, with edema/inflammation involving the entire colon from cecum to rectum. This has progressed since the prior CT approximately 1 month ago. 2. Duodenitis involving the duodenal bulb, descending duodenum and proximal transverse duodenum, new since the CT 1 month ago. 3. No evidence of bowel perforation or abscess. 4. New small bilateral pleural effusions and associated mild passive atelectasis in the lower lobes. 5. Severe generalized atherosclerosis, with possible hemodynamically significant stenoses involving all 3 visceral arteries and both renal arteries, though all of these vessels are patent. 6. Large hiatal hernia as noted previously. 7. Chronic left UPJ stenosis. 8. Atrophic right kidney. 9. Left ovarian cyst which has decreased in size since  the CT 1 month ago, therefore benign. 10. Musculoskeletal findings as above, in particular severe multifactorial spinal stenosis at L3-4 L4-5. Electronically Signed    By: Hulan Saas M.D.   On: 09/28/2015 11:19   Dg Chest Portable 1 View  09/28/2015  CLINICAL DATA:  Low-grade fever with tachycardia. EXAM: PORTABLE CHEST 1 VIEW COMPARISON:  09/02/2015 FINDINGS: 0926 hours. Lung volumes are low. Stable asymmetric elevation right hemidiaphragm. There is left base airspace disease compatible with atelectasis or pneumonia. Tiny left pleural effusion persist. The cardio pericardial silhouette is enlarged. The visualized bony structures of the thorax are intact. Telemetry leads overlie the chest. IMPRESSION: Cardiomegaly with interval slight increase in left base atelectasis or pneumonia. Persistent small left pleural effusion. Electronically Signed   By: Kennith Center M.D.   On: 09/28/2015 09:36    EKG:   Orders placed or performed during the hospital encounter of 08/29/15  . ED EKG  . ED EKG  . EKG 12-Lead  . EKG 12-Lead    IMPRESSION AND PLAN:   Maxima Skelton  is a 82 y.o. female with a known history of mild to moderate dementia, hypertension, hyperlipidemia, arthritis, chronic anemia and CKD stage III who was recently admitted to the hospital from 08/29/2015 to 09/05/2015 her C. difficile colitis and discharged to [facility] skilled nursing facility is brought back secondary to diarrhea and abdominal pain.  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 -CT of the abdomen showing pancolitis with inflammation which has progressed since last month. There is also evidence of duodenitis. -since she was treated with vancomycin last month, will start on Flagyl at this time. Might consider Fidaxomicin. -GI and infectious disease consult requested. Family also questioning about fecal transplant. -Avoid other antibiotics. Start on probiotics as well. -Started on dysphagia diet as tolerated.  #2 bibasilar atelectasis-noted on CT abdomen at lung bases. Will avoid antibiotics if she doesn't have to. Has dry cough with no increase in productive cough. -Just  encourage incentive spirometry at this time. Closely monitor.  #3 chronic anemia-known history of pernicious and iron deficiency anemia. Continue her iron and B12 supplements -Hemoglobin dropped from 8.8 to 8.0 -Continue to monitor and transfuse if hemoglobin is less than 7.  #4 CKD stage III-stable. Continue to monitor  #5 DVT prophylaxis-on Lovenox   All the records are reviewed and case discussed with ED provider. Management plans discussed with the patient, family and they are in agreement.  CODE STATUS: DNR Plan of care discussed with both sons who are at bedside.  TOTAL TIME TAKING CARE OF THIS PATIENT: 50 minutes.    Enid Baas M.D on 09/28/2015 at 2:46 PM  Between 7am to 6pm - Pager - [phone]  After 6pm go to [email or web address] - password [email or web address] [facility]  [place] [facility]  Office  [phone]  CC: Primary care physician; Tillman Abide, MD

## 2015-09-28 NOTE — Progress Notes (Signed)
PT Cancellation Note  Patient Details Name: Melissa Cooper MRN: [id number] DOB: 01/09/1918   Cancelled Treatment:    Reason Eval/Treat Not Completed: Patient declined, no reason specified Pt looking and feeling very tired.  She was in ED earlier this afternoon and is not at all feeling like she can participate with PT.  Family is present and agrees to hold PT until tomorrow.  Loran Senters, PT, DPT [phone]  Malachi Pro 09/28/2015, 4:59 PM

## 2015-09-28 NOTE — ED Provider Notes (Addendum)
[facility] Emergency Department Provider Note  Time seen: 8:40 AM  I have reviewed the triage vital signs and the nursing notes.   HISTORY  Chief Complaint Emesis    HPI Melissa Cooper is a 82 y.o. female with a past medical history of chronic kidney disease, hypertension, hyperlipidemia, arthritis, dementia, presents to the emergency department with abdominal pain, nausea, vomiting and low-grade fevers. According to the nursing facility they state the patient has had "low-grade fevers" for the past several days. Today she appeared uncomfortable, states her abdomen is hurting, has been nauseated and vomiting. Unclear when her last bowel movement was. The patient was treated in December for C. difficile colitis. Patient has significant dementia and cannot contribute to her history.     Past Medical History  Diagnosis Date  . CKD (chronic kidney disease)     stage 3  . Hypercholesteremia   . HTN (hypertension)   . OSA (obstructive sleep apnea)   . PA (pernicious anemia)   . Osteoarthritis   . Dementia   . Osteoarthritis     Patient Active Problem List   Diagnosis Date Noted  . Clostridium difficile colitis 09/05/2015  . Pressure ulcer 08/30/2015  . Sepsis (HCC) 08/29/2015    Past Surgical History  Procedure Laterality Date  . None      Current Outpatient Rx  Name  Route  Sig  Dispense  Refill  . acetaminophen (TYLENOL ARTHRITIS PAIN) 650 MG CR tablet   Oral   Take 650 mg by mouth 3 (three) times daily.         Marland Kitchen acidophilus (RISAQUAD) CAPS capsule   Oral   Take 1 capsule by mouth 2 (two) times daily.   60 capsule   3   . aspirin 81 MG chewable tablet   Oral   Chew 81 mg by mouth daily.         . benzonatate (TESSALON) 100 MG capsule   Oral   Take 1 capsule (100 mg total) by mouth 3 (three) times daily.   20 capsule   0   . feeding supplement, ENSURE ENLIVE, (ENSURE ENLIVE) LIQD   Oral   Take 237 mLs by mouth 2 (two) times  daily between meals.   237 mL   12   . ferrous fumarate (HEMOCYTE - 106 MG FE) 325 (106 Fe) MG TABS tablet   Oral   Take 1 tablet by mouth daily.         Marland Kitchen guaiFENesin-codeine 100-10 MG/5ML syrup   Oral   Take 5 mLs by mouth 4 (four) times daily.   120 mL   0   . traMADol (ULTRAM) 50 MG tablet   Oral   Take 0.5 tablets (25 mg total) by mouth every 12 (twelve) hours as needed (As needed for Mod-severe pain).   30 tablet   0   . vitamin B-12 1000 MCG tablet   Oral   Take 1 tablet (1,000 mcg total) by mouth daily.   30 tablet   0     Allergies Actonel; Ceftin; Evista; Miacalcin; and Zyrtec  History reviewed. No pertinent family history.  Social History Social History  Substance Use Topics  . Smoking status: Never Smoker   . Smokeless tobacco: None  . Alcohol Use: No    Review of Systems Unable to obtain an appropriate/adequate review of systems due to significant dementia. ____________________________________________   PHYSICAL EXAM:  VITAL SIGNS: ED Triage Vitals  Enc Vitals Group  BP 09/28/15 0821 145/122 mmHg     Pulse Rate 09/28/15 0821 115     Resp 09/28/15 0821 23     Temp 09/28/15 0821 98.8 F (37.1 C)     Temp Source 09/28/15 0821 Oral     SpO2 09/28/15 0821 94 %     Weight 09/28/15 0821 127 lb 3.3 oz (57.7 kg)     Height --      Head Cir --      Peak Flow --      Pain Score --      Pain Loc --      Pain Edu? --      Excl. in GC? --     Constitutional: Alert, awake. Mild distress due to pain holding abdomen. Eyes: Normal exam ENT   Head: Normocephalic and atraumatic   Mouth/Throat: Mucous membranes are moist. Cardiovascular: Normal rate, regular rhythm. No murmur Respiratory: Normal respiratory effort without tachypnea nor retractions. Breath sounds are clear and equal bilaterally. No wheezes/rales/rhonchi. Gastrointestinal: Soft, states pain generalized in her abdomen with palpation. No reaction to palpation. No rebound or  guarding. Musculoskeletal: Nontender with normal range of motion in all extremities Neurologic:  Normal speech and language. No gross focal neurologic deficits Skin:  Skin is warm, dry and intact.  Psychiatric: Mood and affect are normal.  ____________________________________________   RADIOLOGY  Chest x-ray shows left basal infiltrate.  EKG reviewed and interpreted by myself shows sinus tachycardia 117 bpm, narrow QRS, normal axis, normal intervals, nonspecific ST changes. No ST elevations. ____________________________________________    INITIAL IMPRESSION / ASSESSMENT AND PLAN / ED COURSE  Pertinent labs & imaging results that were available during my care of the patient were reviewed by me and considered in my medical decision making (see chart for details).  Patient presents to the emergency department with apparent abdominal discomfort nausea and vomiting. Patient has significant dementia and cannot contribute to the history. Cannot tell us exactly where she is hurting but she is holding her abdomen and appears to be grimacing in pain at times. Patient has mild tachycardia, active vomiting per EMS. We will treat the patient's discomfort and nausea, IV hydrate, and obtain lab work including urinalysis.  Chest x-ray shows left basal infiltrate. We will start the patient on IV antibiotics to cover for Hcap. Given the patient's nausea vomiting with apparent abdominal pain and leukocytosis we will also proceed with a CT abdomen/pelvis to further evaluate the patient's abdomen.  C. difficile is positive. I have started patient on IV Flagyl. CT scan shows pancolitis likely related to C. difficile colitis. Patient will be admitted to the hospital for further treatment.  ____________________________________________   FINAL CLINICAL IMPRESSION(S) / ED DIAGNOSES  Abdominal pain Nausea and vomiting Health care associated pneumonia Clostridium difficile colitis  Minna Antis,  MD 09/28/15 1132  Minna Antis, MD 09/28/15 1134

## 2015-09-28 NOTE — [place] FL2 (Signed)
[place] MEDICAID FL2 LEVEL OF CARE SCREENING TOOL     IDENTIFICATION  Patient Name: Melissa Cooper Birthdate: 1917/09/21 Sex: female Admission Date (Current Location): 09/28/2015  [place] and [place] Number:  [address] [id number] K Facility and Address:  [facility], [address] [place], [place] [address]      Provider Number: [id number]  Attending Physician Name and Address:  Enid Baas, MD  Relative Name and Phone Number:       Current Level of Care: Hospital Recommended Level of Care: Skilled Nursing Facility Prior Approval Number:    Date Approved/Denied:   PASRR Number: [id number]  Discharge Plan: SNF    Current Diagnoses: Patient Active Problem List   Diagnosis Date Noted  . Recurrent colitis due to Clostridium difficile 09/28/2015  . Clostridium difficile colitis 09/05/2015  . Pressure ulcer 08/30/2015  . Sepsis (HCC) 08/29/2015    Orientation RESPIRATION BLADDER Height & Weight    Self    Incontinent  (160 cm) 138 lbs.  BEHAVIORAL SYMPTOMS/MOOD NEUROLOGICAL BOWEL NUTRITION STATUS      Incontinent Diet (DSY 3)  AMBULATORY STATUS COMMUNICATION OF NEEDS Skin   Extensive Assist Verbally PU Stage and Appropriate Care                       Personal Care Assistance Level of Assistance  Bathing, Feeding, Dressing Bathing Assistance: Maximum assistance Feeding assistance: Limited assistance Dressing Assistance: Maximum assistance     Functional Limitations Info  Sight, Hearing, Speech Sight Info: Impaired Hearing Info: Impaired Speech Info: Adequate    SPECIAL CARE FACTORS FREQUENCY                       Contractures      Additional Factors Info  Allergies, Isolation Precautions   Allergies Info: Actonel, Ceftin, Evista, Miacalcin, Zyrtec     Isolation Precautions Info: C-Diff     Current Medications (09/28/2015):  This is the current hospital active medication list Current  Facility-Administered Medications  Medication Dose Route Frequency Provider Last Rate Last Dose  . 0.9 %  sodium chloride infusion   Intravenous Continuous Enid Baas, MD      . acetaminophen (TYLENOL) tablet 650 mg  650 mg Oral Q6H PRN Enid Baas, MD       Or  . acetaminophen (TYLENOL) suppository 650 mg  650 mg Rectal Q6H PRN Enid Baas, MD      . acidophilus (RISAQUAD) capsule 1 capsule  1 capsule Oral BID Enid Baas, MD      . aspirin chewable tablet 81 mg  81 mg Oral Daily Enid Baas, MD      . benzonatate (TESSALON) capsule 100 mg  100 mg Oral TID PRN Enid Baas, MD      . enoxaparin (LOVENOX) injection 30 mg  30 mg Subcutaneous Q24H Enid Baas, MD      . feeding supplement (ENSURE ENLIVE) (ENSURE ENLIVE) liquid 237 mL  237 mL Oral BID BM Enid Baas, MD      . ferrous fumarate (HEMOCYTE - 106 mg FE) tablet 106 mg of iron  106 mg of iron Oral Daily Enid Baas, MD      . metroNIDAZOLE (FLAGYL) IVPB 500 mg  500 mg Intravenous Q8H Enid Baas, MD      . ondansetron (ZOFRAN) tablet 4 mg  4 mg Oral Q6H PRN Enid Baas, MD       Or  . ondansetron (ZOFRAN) injection 4 mg  4 mg Intravenous Q6H PRN Enid Baas, MD   4 mg at 09/28/15 1221  . traMADol (ULTRAM) tablet 50 mg  50 mg Oral Q4H PRN Enid Baas, MD   50 mg at 09/28/15 1223  . vitamin B-12 (CYANOCOBALAMIN) tablet 1,000 mcg  1,000 mcg Oral Daily Enid Baas, MD         Discharge Medications: Please see discharge summary for a list of discharge medications.  Relevant Imaging Results:  Relevant Lab Results:   Additional Information [id number]  Soundra Pilon, LCSW

## 2015-09-28 NOTE — ED Notes (Signed)
Patient arrived by EMS from [facility]. Patient Has HX of dementia. Staff at [facility] reports low grade fevers, loose stools, nausea, and elevated pulse.

## 2015-09-28 NOTE — ED Notes (Signed)
Patient transported to CT 

## 2015-09-28 NOTE — Clinical Social Work Note (Signed)
Clinical Social Work Assessment  Patient Details  Name: Melissa Cooper MRN: [id number] Date of Birth: 1918-03-13  Date of referral:  09/28/15               Reason for consult:  Other (Comment Required) (from [facility] )                Permission sought to share information with:  Facility Medical sales representative, Family Supports Permission granted to share information::     Name::        Agency::     Relationship::     Contact Information:     Housing/Transportation Living arrangements for the past 2 months:  Skilled Building surveyor of Information:  Medical Team, Adult Children Patient Interpreter Needed:  None Criminal Activity/Legal Involvement Pertinent to Current Situation/Hospitalization:  No - Comment as needed Significant Relationships:  Adult Children, Merchandiser, retail Lives with:  Facility Resident Do you feel safe going back to the place where you live?  Yes (per family) Need for family participation in patient care:     Care giving concerns:  None at this time   Office manager / plan:  Visual merchandiser (CSW) consult, patient is from SNF.   Patient was alert, disoriented.   (Information provided by patient's two son's Baldo Ash and Cowles at bedside).  CSW introduced self and explained role of CSW department. Per Son's patient is mostly bed bound, has dementia and is confused at to where she is.  Patient currently lives at [facility] and has lived there for the past year.  The plan is for her to return at discharge.          CSW will complete FL2, and contact facility in anticipation of patient returning to [facility] at discharge.  Employment status:    Insurance information:  Medicare, Banker PT Recommendations:  Not assessed at this time Information / Referral to community resources:  Other (Comment Required) (none at this time)  Patient/Family's Response to care:  Patient's sons were appreciative of talking with CSW.    Patient/Family's Understanding of and Emotional Response to Diagnosis, Current Treatment, and Prognosis:  Patient's son understands that she is under continued medical work up at this time.  Once medically stable patient will discharge back to [facility].  Emotional Assessment Appearance:  Appears stated age Attitude/Demeanor/Rapport:  Unable to Assess Affect (typically observed):  Calm, Pleasant Orientation:  Oriented to Self Alcohol / Substance use:    Psych involvement (Current and /or in the community):  No (Comment)  Discharge Needs  Concerns to be addressed:  Care Coordination, Discharge Planning Concerns Readmission within the last 30 days:  Yes Current discharge risk:  Chronically ill, Cognitively Impaired, Dependent with Mobility Barriers to Discharge:  Continued Medical Work up   Soundra Pilon, LCSW 09/28/2015, 2:48 PM

## 2015-09-29 LAB — BASIC METABOLIC PANEL
Anion gap: 9 (ref 5–15)
BUN: 25 mg/dL — ABNORMAL HIGH (ref 6–20)
CHLORIDE: 104 mmol/L (ref 101–111)
CO2: 27 mmol/L (ref 22–32)
Calcium: 7.4 mg/dL — ABNORMAL LOW (ref 8.9–10.3)
Creatinine, Ser: 0.91 mg/dL (ref 0.44–1.00)
GFR, EST NON AFRICAN AMERICAN: 52 mL/min — AB (ref >60)
Glucose, Bld: 88 mg/dL (ref 65–99)
POTASSIUM: 4.2 mmol/L (ref 3.5–5.1)
SODIUM: 140 mmol/L (ref 135–145)

## 2015-09-29 LAB — CBC
HCT: 21.8 % — ABNORMAL LOW (ref 35.0–47.0)
Hemoglobin: 6.6 g/dL — ABNORMAL LOW (ref 12.0–16.0)
MCH: 23.4 pg — AB (ref 26.0–34.0)
MCHC: 30.4 g/dL — AB (ref 32.0–36.0)
MCV: 77.1 fL — ABNORMAL LOW (ref 80.0–100.0)
Platelets: 296 10*3/uL (ref 150–440)
RBC: 2.82 MIL/uL — ABNORMAL LOW (ref 3.80–5.20)
RDW: 20.3 % — AB (ref 11.5–14.5)
WBC: 10 10*3/uL (ref 3.6–11.0)

## 2015-09-29 LAB — ABO/RH: ABO/RH(D): O NEG

## 2015-09-29 LAB — PREPARE RBC (CROSSMATCH)

## 2015-09-29 MED ORDER — SODIUM CHLORIDE 0.9 % IV SOLN
Freq: Once | INTRAVENOUS | Status: AC
  Administered 2015-09-29: 10:00:00 via INTRAVENOUS

## 2015-09-29 MED ORDER — VANCOMYCIN 50 MG/ML ORAL SOLUTION
250.0000 mg | Freq: Four times a day (QID) | ORAL | Status: DC
  Administered 2015-09-29 – 2015-10-06 (×24): 250 mg via ORAL
  Filled 2015-09-29 (×31): qty 5

## 2015-09-29 MED ORDER — PANTOPRAZOLE SODIUM 40 MG IV SOLR
40.0000 mg | Freq: Two times a day (BID) | INTRAVENOUS | Status: DC
Start: 2015-09-29 — End: 2015-10-06
  Administered 2015-09-29 – 2015-10-06 (×14): 40 mg via INTRAVENOUS
  Filled 2015-09-29 (×15): qty 40

## 2015-09-29 NOTE — Consult Note (Signed)
82 y/o WF with recurrent C. Diff.  She needs either oral vancomycin or Dificid, iv flagyl is not effective enough to be the sole antibiotic.  She has hx of very heme positive stool and RUQ/epigastric abd pain so recommend PPi bid iv for now.  Due to age would not recommend endoscopic tests at this time. I agree with transfusions.  Will check SPIE to see if gamma globulin low and if so an infusion of gamma globulin  may help in C. Diff colitis.  Will follow with you.

## 2015-09-29 NOTE — Progress Notes (Signed)
Pt received 2 units packed RBC today

## 2015-09-29 NOTE — Progress Notes (Signed)
Initial Nutrition Assessment  DOCUMENTATION CODES:   Severe malnutrition in context of acute illness/injury  INTERVENTION:  Meals and snacks: Cater to pt preferences Medical Nutrition Supplement Therapy: Will add mightyshake TID for added nutrition   NUTRITION DIAGNOSIS:   Inadequate oral intake related to altered GI function as evidenced by per patient/family report, percent weight loss.    GOAL:   Patient will meet greater than or equal to 90% of their needs    MONITOR:    (Energy intake, Digestive system)  REASON FOR ASSESSMENT:    (nectar thick liquids)    ASSESSMENT:     Pt admitted with recurrent c-diff colitis, recent admission For c-diff, sepsis  Past Medical History  Diagnosis Date  . CKD (chronic kidney disease)     stage 3  . Hypercholesteremia   . HTN (hypertension)   . OSA (obstructive sleep apnea)   . PA (pernicious anemia)   . Osteoarthritis   . Dementia   . Osteoarthritis     Current Nutrition: nothing eaten off lunch tray  Food/Nutrition-Related History: Per family at bedside pt with decreased intake for the past 3 weeks secondary to abdominal pain and diarrhea   Scheduled Medications:  . acidophilus  1 capsule Oral BID  . aspirin  81 mg Oral Daily  . enoxaparin (LOVENOX) injection  30 mg Subcutaneous Q24H  . feeding supplement (ENSURE ENLIVE)  237 mL Oral BID BM  . ferrous fumarate  106 mg of iron Oral Daily  . metronidazole  500 mg Intravenous Q8H  . vitamin B-12  1,000 mcg Oral Daily    Continuous Medications:  . sodium chloride 60 mL/hr at 09/28/15 1537     Electrolyte/Renal Profile and Glucose Profile:   Recent Labs Lab 09/28/15 0825 09/29/15 0343  NA 139 140  K 3.3* 4.2  CL 101 104  CO2 29 27  BUN 28* 25*  CREATININE 0.96 0.91  CALCIUM 7.8* 7.4*  GLUCOSE 123* 88   Protein Profile:  Recent Labs Lab 09/28/15 0825  ALBUMIN 2.4*    Gastrointestinal Profile: Last BM:1/30   Nutrition-Focused Physical  Exam Findings: deferred pt sleeping   Weight Change: 8% wt loss in the last month   Diet Order:  DIET DYS 3 Room service appropriate?: Yes; Fluid consistency:: Nectar Thick  Skin:   reviewed     Height:   Ht Readings from Last 1 Encounters:  09/28/15  (1.6 m)    Weight:   Wt Readings from Last 1 Encounters:  09/28/15 138 lb (62.596 kg)    Ideal Body Weight:     BMI:  Body mass index is 24.45 kg/(m^2).  Estimated Nutritional Needs:   Kcal:  BEE 994 kcals (IF 1.0-1.2, AF 1.3) 1292-1550 kcals/d.   Protein:  (1.0-1.2 g/kg) 62-74 g/d  Fluid:  (25-68ml/kg) 1550-1890ml/d  EDUCATION NEEDS:   No education needs identified at this time  MODERATE Care Level  Delle Andrzejewski B. Freida Busman, RD, LDN [phone] (pager) Weekend/On-Call pager [phone])

## 2015-09-29 NOTE — Progress Notes (Signed)
PT Cancellation Note  Patient Details Name: Melissa Cooper MRN: [id number] DOB: 1918/03/26   Cancelled Treatment:    Reason Eval/Treat Not Completed: Patient not medically ready. Patient currently with Hgb of 6.6 and will be having a transfusion this AM. PT will defer mobility evaluation until patient is more medically appropriate.   Kerin Ransom, PT, DPT    09/29/2015, 8:22 AM

## 2015-09-29 NOTE — Consult Note (Signed)
Consultation  Referring Provider: Dr. Imogene Burn Primary Care Physician:  Tillman Abide, MD Consulting  Gastroenterologist:   Dr. Lynnae Prude      Reason for Consultation: C difficile            HPI:   Melissa Cooper is a 82 y.o. female with a known history of mild to moderate dementia, hypertension, hyperlipidemia, arthritis, chronic anemia and CKD stage III who was recently admitted to the hospital from 08/29/2015 to 09/05/2015 for  C. difficile colitis. The  CT of the abdomen at that time showed pan colitis and she was started on oral vancomycin and was discharged on the same. Her diarrhea and abdominal pain were improved at the time of discharge. She developed a cough in the hospital at the time and was changed over to dysphagia diet. No reported episodes of aspiration but has been having some occasional dry cough. She finished her antibiotic no more than 2 weeks ago.   Her appetite has remained poor.  According to the admission H and P she developed intermittent abdominal pain especially after eating 3 days ago. She had a fever, LUQ pain and LLQ pain with diarrhea, nausea and vomiting on 09/28/2015. She had a loose stool at midnight and no further bowel movement today.  She does wear depends and nursing staff notes that she is incontinent of both urine and stool.  She underwent another CT of the abdomen on 09/28/2015 that showed progressive pan colitis, with edema/ inflammation involving the entire colon from cecum to rectum and new duodenitis involving the duodenal bulb, descending  duodenum, and proximal transverse duodenum.  Currently, she is resting in bed. No family present and she is a poor historian. She admits to left sided abdominal discomfort to palpation.  No BM sine MN. DRE shows dark brown/black stool heme pos. Tender left abdomen especially upper quadrant -soft. Getting blood for acute on chronic anemia with significant hemoglobin drop   9.2-8.0 now 6.6.   Her stool study is once again  positive for C difficile antigen an active toxin.   She is receiving IV Flagyl.   Past Medical History  Diagnosis Date  . CKD (chronic kidney disease)     stage 3  . Hypercholesteremia   . HTN (hypertension)   . OSA (obstructive sleep apnea)   . PA (pernicious anemia)   . Osteoarthritis   . Dementia   . Osteoarthritis     Past Surgical History  Procedure Laterality Date  . None      History reviewed. No pertinent family history.   Social History  Substance Use Topics  . Smoking status: Never Smoker   . Smokeless tobacco: None  . Alcohol Use: No    Prior to Admission medications   Medication Sig Start Date End Date Taking? Authorizing Provider  acetaminophen (TYLENOL ARTHRITIS PAIN) 650 MG CR tablet Take 650 mg by mouth 3 (three) times daily.   Yes Historical Provider, MD  aspirin 81 MG chewable tablet Chew 81 mg by mouth daily.   Yes Historical Provider, MD  benzonatate (TESSALON) 100 MG capsule Take 1 capsule (100 mg total) by mouth 3 (three) times daily. Patient taking differently: Take 100 mg by mouth 3 (three) times daily as needed for cough.  09/05/15  Yes Gale Journey, MD  bifidobacterium infantis (ALIGN) capsule Take 1 capsule by mouth every morning.   Yes Historical Provider, MD  feeding supplement, ENSURE ENLIVE, (ENSURE ENLIVE) LIQD Take 237 mLs by mouth 2 (two) times  daily between meals. 09/05/15  Yes Gale Journey, MD  ferrous fumarate (HEMOCYTE - 106 MG FE) 325 (106 Fe) MG TABS tablet Take 325 mg of iron by mouth daily.    Yes Historical Provider, MD  ondansetron (ZOFRAN) 4 MG tablet Take 4 mg by mouth 3 (three) times daily as needed for nausea or vomiting.   Yes Historical Provider, MD  traMADol (ULTRAM) 50 MG tablet Take 0.5 tablets (25 mg total) by mouth every 12 (twelve) hours as needed (As needed for Mod-severe pain). Patient taking differently: Take 50 mg by mouth every 4 (foumore and r) hours as needed for moderate pain or severe pain (As needed for  Mod-severe pain).  09/05/15  Yes Gale Journey, MD  vitamin B-12 1000 MCG tablet Take 1 tablet (1,000 mcg total) by mouth daily. 09/05/15  Yes Gale Journey, MD  acidophilus (RISAQUAD) CAPS capsule Take 1 capsule by mouth 2 (two) times daily. 09/05/15   Gale Journey, MD  guaiFENesin-codeine 100-10 MG/5ML syrup Take 5 mLs by mouth 4 (four) times daily. 09/05/15   Gale Journey, MD    Current Facility-Administered Medications  Medication Dose Route Frequency Provider Last Rate Last Dose  . 0.9 %  sodium chloride infusion   Intravenous Continuous Enid Baas, MD 60 mL/hr at 09/28/15 1537    . acetaminophen (TYLENOL) tablet 650 mg  650 mg Oral Q6H PRN Enid Baas, MD       Or  . acetaminophen (TYLENOL) suppository 650 mg  650 mg Rectal Q6H PRN Enid Baas, MD      . acidophilus (RISAQUAD) capsule 1 capsule  1 capsule Oral BID Enid Baas, MD   1 capsule at 09/29/15 1021  . aspirin chewable tablet 81 mg  81 mg Oral Daily Enid Baas, MD   81 mg at 09/29/15 1022  . benzonatate (TESSALON) capsule 100 mg  100 mg Oral TID PRN Enid Baas, MD      . enoxaparin (LOVENOX) injection 30 mg  30 mg Subcutaneous Q24H Enid Baas, MD   30 mg at 09/28/15 1538  . feeding supplement (ENSURE ENLIVE) (ENSURE ENLIVE) liquid 237 mL  237 mL Oral BID BM Enid Baas, MD   237 mL at 09/28/15 1400  . ferrous fumarate (HEMOCYTE - 106 mg FE) tablet 106 mg of iron  106 mg of iron Oral Daily Enid Baas, MD   106 mg of iron at 09/29/15 1022  . metroNIDAZOLE (FLAGYL) IVPB 500 mg  500 mg Intravenous Q8H Enid Baas, MD   500 mg at 09/29/15 0458  . ondansetron (ZOFRAN) tablet 4 mg  4 mg Oral Q6H PRN Enid Baas, MD       Or  . ondansetron (ZOFRAN) injection 4 mg  4 mg Intravenous Q6H PRN Enid Baas, MD   4 mg at 09/28/15 1617  . traMADol (ULTRAM) tablet 50 mg  50 mg Oral Q4H PRN Enid Baas, MD   50 mg at 09/29/15 0459  . vitamin B-12  (CYANOCOBALAMIN) tablet 1,000 mcg  1,000 mcg Oral Daily Enid Baas, MD   1,000 mcg at 09/29/15 1022    Allergies as of 09/28/2015 - Review Complete 09/28/2015  Allergen Reaction Noted  . Actonel [risedronate sodium] Other (See Comments) 08/29/2015  . Ceftin [cefuroxime axetil] Other (See Comments) 08/29/2015  . Evista [raloxifene] Other (See Comments) 08/29/2015  . Miacalcin [calcitonin (salmon)] Other (See Comments) 08/29/2015  . Zyrtec [cetirizine] Other (See Comments) 08/29/2015     Review of Systems:  She is a poor historian and positive ROS as noted in HPI.    Physical Exam:  Vital signs in last 24 hours: Temp:  [97.9 F (36.6 C)-99.4 F (37.4 C)] 99 F (37.2 C) (01/30 1117) Pulse Rate:  [105-125] 111 (01/30 1117) Resp:  [18-24] 18 (01/30 1117) BP: (96-132)/(48-59) 108/58 mmHg (01/30 1117) SpO2:  [88 %-100 %] 96 % (01/30 1117) Weight:  [62.596 kg (138 lb)] 62.596 kg (138 lb) (01/29 1334) Last BM Date: 09/28/15  General:  Frail elderly Caucasian female resting in bed. Head:  Head without obvious abnormality, atraumatic  Eyes:   Conjunctiva pink, sclera anicteric  ENT:   Mouth free of lesions, mucosa dry,  Neck:   Supple w trachea midline, no adenopathy  Lungs: Clear to auscultation bilaterally, respirations unlabored Heart:     Normal S1S2, no rubs, murmurs, gallops. Abdomen: Tender LUQ and LLQ soft Rectal: Heme pos, dark brown almost black,  Lymph:  No cervical or supraclavicular adenopathy. Extremities:   No edema, cyanosis, or clubbing Skin  Skin color, texture dry with multiple keratosis present ,pressure ulcer-covered and not examined  Neuro:  A&O x 3. CNII-XII intact, normal strength Psych:  Appropriate mood and affect.  Data Reviewed:  LAB RESULTS:  Recent Labs  09/28/15 0825 09/29/15 0343  WBC 14.9* 10.0  HGB 8.0* 6.6*  HCT 25.9* 21.8*  PLT 380 296   BMET  Recent Labs  09/28/15 0825 09/29/15 0343  NA 139 140  K 3.3* 4.2  CL 101 104   CO2 29 27  GLUCOSE 123* 88  BUN 28* 25*  CREATININE 0.96 0.91  CALCIUM 7.8* 7.4*   LFT  Recent Labs  09/28/15 0825  PROT 5.6*  ALBUMIN 2.4*  AST 13*  ALT 7*  ALKPHOS 73  BILITOT 0.8   PT/INR No results for input(s): LABPROT, INR in the last 72 hours.  STUDIES: Ct Abdomen Pelvis W Contrast  09/28/2015  CLINICAL DATA:  82 year old nursing home patient with current history of dementia presenting with acute onset of fever, left upper lobe quadrant and left lower quadrant abdominal pain, diarrhea, nausea and vomiting which began earlier today. EXAM: CT ABDOMEN AND PELVIS WITH CONTRAST TECHNIQUE: Multidetector CT imaging of the abdomen and pelvis was performed using the standard protocol following bolus administration of intravenous contrast. CONTRAST:  75mL OMNIPAQUE IOHEXOL 300 MG/ML IV. The patient was unable to tolerate oral contrast material, so only a small amount of oral contrast was administered. COMPARISON:  Unenhanced CT abdomen pelvis 08/29/2015. FINDINGS: Lower chest: Small bilateral pleural effusions, new since the prior CT, with associated mild passive atelectasis in the lower lobes. Heart size normal with left atrial enlargement, mitral annular calcifications, aortic annular calcification and aortic valvular calcification. LAD and circumflex coronary artery atherosclerosis. Hepatobiliary: Calcified granuloma involving the dome of the liver. No significant abnormality involving the liver. Gallbladder normal in appearance without calcified gallstones. No biliary ductal dilation. Pancreas: Moderate pancreatic atrophy. No focal parenchymal abnormality. No peripancreatic inflammation. Spleen: Normal in size and appearance. Adrenals/Urinary Tract: Mild bilateral adrenal enlargement without nodularity. Atrophic right kidney. Multiple cortical cysts involving both kidneys, 1 of which in the upper pole of the left kidney was hyperdense on the prior study. No solid mass involving either  kidney. No urinary tract calculi. Moderate dilation of the left renal pelvis and mild dilation of the calices in left kidney, unchanged, without associated ureteral dilation. Normal-appearing decompressed urinary bladder. Stomach/Bowel: Large hiatal hernia as noted previously with approximately 2/3 of the stomach  intrathoracic. Marked wall thickening involving the duodenal bulb, descending duodenum and proximal transverse duodenum, new since the prior CT. Remaining small bowel unremarkable. Moderate diffuse colonic wall thickening from cecum to rectum, associated with edema/inflammation in the pericolonic fat, progressive since the prior CT. Sigmoid colon diverticulosis. No abnormal fluid collections. Normal appendix in the right upper pelvis. No ascites. Vascular/Lymphatic: Severe aorto-iliofemoral atherosclerosis without aneurysm. Atherosclerosis at the origins of the celiac, SMA and IMA, associated with stenoses, though all 3 arteries are patent. No visible branch vessel occlusions. Atherosclerosis at the origins of both renal arteries, also with stenosis. No pathologic lymphadenopathy. Reproductive: Atrophic uterus and right ovary. Left ovarian cyst measuring approximately 1.5 x 2.8 x 2.7 cm, decreased in size since the prior CT. Other: Small umbilical hernia containing fat.  Sarcopenia. Musculoskeletal: Severe degenerative disc disease, spondylosis and facet degenerative changes throughout the lower thoracic and lumbar spine with thoracolumbar scoliosis convex right. Osseous demineralization. Symmetric moderate to severe osteoarthritis in both hips. Severe multifactorial spinal stenosis at L3-4 and L4-5. IMPRESSION: 1. Pancolitis, with edema/inflammation involving the entire colon from cecum to rectum. This has progressed since the prior CT approximately 1 month ago. 2. Duodenitis involving the duodenal bulb, descending duodenum and proximal transverse duodenum, new since the CT 1 month ago. 3. No evidence of  bowel perforation or abscess. 4. New small bilateral pleural effusions and associated mild passive atelectasis in the lower lobes. 5. Severe generalized atherosclerosis, with possible hemodynamically significant stenoses involving all 3 visceral arteries and both renal arteries, though all of these vessels are patent. 6. Large hiatal hernia as noted previously. 7. Chronic left UPJ stenosis. 8. Atrophic right kidney. 9. Left ovarian cyst which has decreased in size since the CT 1 month ago, therefore benign. 10. Musculoskeletal findings as above, in particular severe multifactorial spinal stenosis at L3-4 L4-5. Electronically Signed   By: Hulan Saas M.D.   On: 09/28/2015 11:19   Dg Chest Portable 1 View  09/28/2015  CLINICAL DATA:  Low-grade fever with tachycardia. EXAM: PORTABLE CHEST 1 VIEW COMPARISON:  09/02/2015 FINDINGS: 0926 hours. Lung volumes are low. Stable asymmetric elevation right hemidiaphragm. There is left base airspace disease compatible with atelectasis or pneumonia. Tiny left pleural effusion persist. The cardio pericardial silhouette is enlarged. The visualized bony structures of the thorax are intact. Telemetry leads overlie the chest. IMPRESSION: Cardiomegaly with interval slight increase in left base atelectasis or pneumonia. Persistent small left pleural effusion. Electronically Signed   By: Kennith Center M.D.   On: 09/28/2015 09:36     Assessment:  Melissa Cooper is a 82 y.o. with a known history of mild to moderate dementia, hypertension, hyperlipidemia, arthritis, chronic anemia and CKD stage III. She is a permanent resident at [facility]. She is re-admitted to  the hospital with recurrence of C difficile colitis.  The CT study shows  pancolitis which has progressed since last month.  There is new evidence of duodenitis.  She is on IV Flagyl.  She has acute on chronic anemia.  She is heme-positive dark brown/ black stool today.  Patient appears to have developed a GI bleed.   This could be upper or lower in origin. She has had an abrupt drop in her hemoglobin which is concerning.  She certainly could have an ulcer given the new duodenal findings.  Plan:  Step up therapy for C difficile treatment with vancomycin 250 mg every 6 hours in addition to the IV Flagyl. Obtain blood work [employer] . Begin IV  Protonix twice daily and  would decrease her diet considering possible upper GI ulcer. She does have poor nutrition, low albumin, and this will need to be monitored closely. Consider stopping her probiotic medication in the setting of acute colitis. She is too fragile for luminal evaluation.  No recommendation for EGD or colonoscopy.  This case was discussed with Dr. Scot Jun in collaboration of care. Thank you for the consultation.  These services provided by Amedeo Kinsman RN, MSN, ANP-BC under collaborative practice agreement with Scot Jun, MD.  09/29/2015, 11:59 AM

## 2015-09-29 NOTE — Consult Note (Signed)
[facility]ious Disease     Reason for Consult:Recurrent C diff    Referring Physician: Estanislado Spire Date of Admission:  09/28/2015   Active Problems:   Recurrent colitis due to Clostridium difficile   HPI: Melissa Cooper is a 82 y.o. female with dementia, htn, ckd and anemia with recent admission 12/30-1/6 with sepsis due to C diff. She was discharged on oral vanco with stop date 1/14. Apparently diarrhea had resolved. Readmitted 1/29 with recurrent diarrhea and abd pain .  CT showed recurrent pan colitis, wbc was 14.9 and C diff testing was +. Started on IV flagyl seen by GI and added oral vanco.  Today when I see her no family in room, she is sleepy and a poor historian providing no real history.   Past Medical History  Diagnosis Date  . CKD (chronic kidney disease)     stage 3  . Hypercholesteremia   . HTN (hypertension)   . OSA (obstructive sleep apnea)   . PA (pernicious anemia)   . Osteoarthritis   . Dementia   . Osteoarthritis    Past Surgical History  Procedure Laterality Date  . None     Social History  Substance Use Topics  . Smoking status: Never Smoker   . Smokeless tobacco: None  . Alcohol Use: No   History reviewed. No pertinent family history.  Allergies:  Allergies  Allergen Reactions  . Actonel [Risedronate Sodium] Other (See Comments)    Unknown reaction  . Ceftin [Cefuroxime Axetil] Other (See Comments)    Unknown reaction  . Evista [Raloxifene] Other (See Comments)    Unknown reaction  . Miacalcin [Calcitonin ([place])] Other (See Comments)    Unknown reaction  . Zyrtec [Cetirizine] Other (See Comments)    Unknown reaction    Current antibiotics: Antibiotics Given (last 72 hours)    Date/Time Action Medication Dose Rate   09/28/15 1537 Given   metroNIDAZOLE (FLAGYL) IVPB 500 mg 500 mg 100 mL/hr   09/28/15 2206 Given   metroNIDAZOLE (FLAGYL) IVPB 500 mg 500 mg 100 mL/hr   09/29/15 0458 Given   metroNIDAZOLE (FLAGYL) IVPB 500 mg 500 mg  100 mL/hr   09/29/15 1415 Given   metroNIDAZOLE (FLAGYL) IVPB 500 mg 500 mg 100 mL/hr      MEDICATIONS: . acidophilus  1 capsule Oral BID  . aspirin  81 mg Oral Daily  . enoxaparin (LOVENOX) injection  30 mg Subcutaneous Q24H  . ferrous fumarate  106 mg of iron Oral Daily  . metronidazole  500 mg Intravenous Q8H  . pantoprazole (PROTONIX) IV  40 mg Intravenous Q12H  . vancomycin  250 mg Oral 4 times per day  . vitamin B-12  1,000 mcg Oral Daily    Review of Systems - unable to obtain    OBJECTIVE: Temp:  [97.8 F (36.6 C)-99.4 F (37.4 C)] 97.8 F (36.6 C) (01/30 1727) Pulse Rate:  [110-125] 113 (01/30 1727) Resp:  [18-20] 18 (01/30 1727) BP: (96-138)/(50-81) 133/81 mmHg (01/30 1727) SpO2:  [88 %-100 %] 100 % (01/30 1727) Physical Exam  Constitutional:  Frail, lying in bed,  HENT: [place]/AT, PERRLA, no scleral icterus Mouth/Throat: Oropharynx is clear and dry . No oropharyngeal exudate.  Cardiovascular: Normal rate,  Pulmonary/Chest: Effort normal and breath sounds normal. No respiratory distress.  has no wheezes.  Neck = supple, no nuchal rigidity Abdominal: Soft. Bowel sounds are normal.  exhibits no distension. There is no tenderness.  Lymphadenopathy: no cervical adenopathy. No axillary adenopathy Neurological:  able to wak eup and answer yes no questions Skin: Skin is warm and dry. No rash noted. No erythema.  Psychiatric: lethargic.    LABS: Results for orders placed or performed during the hospital encounter of 09/28/15 (from the past 48 hour(s))  CBC     Status: Abnormal   Collection Time: 09/28/15  8:25 AM  Result Value Ref Range   WBC 14.9 (H) 3.6 - 11.0 K/uL   RBC 3.39 (L) 3.80 - 5.20 MIL/uL   Hemoglobin 8.0 (L) 12.0 - 16.0 g/dL   HCT 25.9 (L) 35.0 - 47.0 %   MCV 76.4 (L) 80.0 - 100.0 fL   MCH 23.6 (L) 26.0 - 34.0 pg   MCHC 30.9 (L) 32.0 - 36.0 g/dL   RDW 20.1 (H) 11.5 - 14.5 %   Platelets 380 150 - 440 K/uL  Comprehensive metabolic panel     Status:  Abnormal   Collection Time: 09/28/15  8:25 AM  Result Value Ref Range   Sodium 139 135 - 145 mmol/L   Potassium 3.3 (L) 3.5 - 5.1 mmol/L   Chloride 101 101 - 111 mmol/L   CO2 29 22 - 32 mmol/L   Glucose, Bld 123 (H) 65 - 99 mg/dL   BUN 28 (H) 6 - 20 mg/dL   Creatinine, Ser 0.96 0.44 - 1.00 mg/dL   Calcium 7.8 (L) 8.9 - 10.3 mg/dL   Total Protein 5.6 (L) 6.5 - 8.1 g/dL   Albumin 2.4 (L) 3.5 - 5.0 g/dL   AST 13 (L) 15 - 41 U/L   ALT 7 (L) 14 - 54 U/L   Alkaline Phosphatase 73 38 - 126 U/L   Total Bilirubin 0.8 0.3 - 1.2 mg/dL   GFR calc non Af Amer 49 (L) >60 mL/min   GFR calc Af Amer 56 (L) >60 mL/min    Comment: (NOTE) The eGFR has been calculated using the CKD EPI equation. This calculation has not been validated in all clinical situations. eGFR's persistently <60 mL/min signify possible Chronic Kidney Disease.    Anion gap 9 5 - 15  Lipase, blood     Status: None   Collection Time: 09/28/15  8:25 AM  Result Value Ref Range   Lipase 20 11 - 51 U/L  Urinalysis complete, with microscopic ([facility] only)     Status: Abnormal   Collection Time: 09/28/15  8:25 AM  Result Value Ref Range   Color, Urine YELLOW (A) YELLOW   APPearance CLOUDY (A) CLEAR   Glucose, UA NEGATIVE NEGATIVE mg/dL   Bilirubin Urine NEGATIVE NEGATIVE   Ketones, ur TRACE (A) NEGATIVE mg/dL   Specific Gravity, Urine 1.025 1.005 - 1.030   Hgb urine dipstick NEGATIVE NEGATIVE   pH 5.0 5.0 - 8.0   Protein, ur 30 (A) NEGATIVE mg/dL   Nitrite NEGATIVE NEGATIVE   Leukocytes, UA NEGATIVE NEGATIVE   RBC / HPF 6-30 0 - 5 RBC/hpf   WBC, UA 6-30 0 - 5 WBC/hpf   Bacteria, UA RARE (A) NONE SEEN   Squamous Epithelial / LPF 0-5 (A) NONE SEEN   Mucous PRESENT    Hyaline Casts, UA PRESENT    Granular Casts, UA PRESENT   C difficile quick scan w PCR reflex     Status: Abnormal   Collection Time: 09/28/15  9:32 AM  Result Value Ref Range   C Diff antigen POSITIVE (A) NEGATIVE   C Diff toxin POSITIVE (A) NEGATIVE   C  Diff interpretation  Positive for toxigenic C. difficile, active toxin production present.    Comment: CRITICAL RESULT CALLED TO, READ BACK BY AND VERIFIED WITH: AMBER JONES 09/28/15 AT 1114 BY HS   Blood culture (routine x 2)     Status: None (Preliminary result)   Collection Time: 09/28/15  9:52 AM  Result Value Ref Range   Specimen Description BLOOD LEFT HAND    Special Requests BOTTLES DRAWN AEROBIC AND ANAEROBIC  4CC    Culture NO GROWTH 1 DAY    Report Status PENDING   Blood culture (routine x 2)     Status: None (Preliminary result)   Collection Time: 09/28/15 10:05 AM  Result Value Ref Range   Specimen Description BLOOD LEFT ANTECUBITAL    Special Requests BOTTLES DRAWN AEROBIC AND ANAEROBIC  5CC    Culture NO GROWTH 1 DAY    Report Status PENDING   Basic metabolic panel     Status: Abnormal   Collection Time: 09/29/15  3:43 AM  Result Value Ref Range   Sodium 140 135 - 145 mmol/L   Potassium 4.2 3.5 - 5.1 mmol/L   Chloride 104 101 - 111 mmol/L   CO2 27 22 - 32 mmol/L   Glucose, Bld 88 65 - 99 mg/dL   BUN 25 (H) 6 - 20 mg/dL   Creatinine, Ser 0.91 0.44 - 1.00 mg/dL   Calcium 7.4 (L) 8.9 - 10.3 mg/dL   GFR calc non Af Amer 52 (L) >60 mL/min   GFR calc Af Amer >60 >60 mL/min    Comment: (NOTE) The eGFR has been calculated using the CKD EPI equation. This calculation has not been validated in all clinical situations. eGFR's persistently <60 mL/min signify possible Chronic Kidney Disease.    Anion gap 9 5 - 15  CBC     Status: Abnormal   Collection Time: 09/29/15  3:43 AM  Result Value Ref Range   WBC 10.0 3.6 - 11.0 K/uL   RBC 2.82 (L) 3.80 - 5.20 MIL/uL   Hemoglobin 6.6 (L) 12.0 - 16.0 g/dL   HCT 21.8 (L) 35.0 - 47.0 %   MCV 77.1 (L) 80.0 - 100.0 fL   MCH 23.4 (L) 26.0 - 34.0 pg   MCHC 30.4 (L) 32.0 - 36.0 g/dL   RDW 20.3 (H) 11.5 - 14.5 %   Platelets 296 150 - 440 K/uL  Prepare RBC     Status: None   Collection Time: 09/29/15  8:11 AM  Result Value Ref  Range   Order Confirmation ORDER PROCESSED BY BLOOD BANK   Type and screen [place]     Status: None (Preliminary result)   Collection Time: 09/29/15  8:11 AM  Result Value Ref Range   ABO/RH(D) O NEG    Antibody Screen NEG    Sample Expiration 10/02/2015    Unit Number [id number]    Blood Component Type RBC, LR IRR    Unit division 00    Status of Unit ISSUED    Transfusion Status OK TO TRANSFUSE    Crossmatch Result Compatible    Unit Number [id number]    Blood Component Type RED CELLS,LR    Unit division 00    Status of Unit ISSUED    Transfusion Status OK TO TRANSFUSE    Crossmatch Result Compatible   ABO/Rh     Status: None   Collection Time: 09/29/15  8:12 AM  Result Value Ref Range   ABO/RH(D) O NEG    No components  found for: ESR, C REACTIVE PROTEIN MICRO: Recent Results (from the past 720 hour(s))  C difficile quick scan w PCR reflex     Status: Abnormal   Collection Time: 09/28/15  9:32 AM  Result Value Ref Range Status   C Diff antigen POSITIVE (A) NEGATIVE Final   C Diff toxin POSITIVE (A) NEGATIVE Final   C Diff interpretation   Final    Positive for toxigenic C. difficile, active toxin production present.    Comment: CRITICAL RESULT CALLED TO, READ BACK BY AND VERIFIED WITH: AMBER JONES 09/28/15 AT 1114 BY HS   Blood culture (routine x 2)     Status: None (Preliminary result)   Collection Time: 09/28/15  9:52 AM  Result Value Ref Range Status   Specimen Description BLOOD LEFT HAND  Final   Special Requests BOTTLES DRAWN AEROBIC AND ANAEROBIC  4CC  Final   Culture NO GROWTH 1 DAY  Final   Report Status PENDING  Incomplete  Blood culture (routine x 2)     Status: None (Preliminary result)   Collection Time: 09/28/15 10:05 AM  Result Value Ref Range Status   Specimen Description BLOOD LEFT ANTECUBITAL  Final   Special Requests BOTTLES DRAWN AEROBIC AND ANAEROBIC  5CC  Final   Culture NO GROWTH 1 DAY  Final   Report Status  PENDING  Incomplete    IMAGING: Ct Chest Wo Contrast  09/02/2015  CLINICAL DATA:  Followup to current chest radiograph. Chest radiograph showed right hilar fullness. EXAM: CT CHEST WITHOUT CONTRAST TECHNIQUE: Multidetector CT imaging of the chest was performed following the standard protocol without IV contrast. COMPARISON:  09/02/2015. FINDINGS: Neck base and axilla: No mass or adenopathy. Thyroid is unremarkable. Mediastinum and hila: Heart normal in size and configuration. There are moderate coronary artery calcifications. Trace pericardial fluid is noted. Moderate size hiatal hernia. No mediastinal or hilar masses or enlarged lymph nodes. Lungs and pleura: Small right pleural effusion. There is bronchial wall thickening and adjacent linear and reticular opacity most evident in the lower lobes, right greater than left, and base of the right middle lobe and left upper lobe lingula, consistent with a combination of chronic bronchial wall thickening and associated subsegmental atelectasis no evidence of pneumonia or pulmonary edema. There is a 4 mm nodule in the right lower lobe, superior segment. Mild scarring is noted at the apices. Limited upper abdomen: Small amount of ascites. This has developed since the abdomen and pelvis CT dated 08/29/2015. Musculoskeletal: Diffuse bony demineralization. Significant degenerative changes are noted throughout the thoracic spine. Old healed mid sternal fracture. No osteoblastic or osteolytic lesions. IMPRESSION: 1. The fullness of the right hilum noted on the prior chest radiograph was due to prominence of the right hilar vasculature accentuated by low lung volumes. There is no mediastinal or hilar mass or adenopathy. 2. Small right pleural effusion. Mild dependent subsegmental atelectasis. No evidence of pneumonia or pulmonary edema. 3. Small amount ascites has developed since the prior abdomen and pelvis CT. 4. 4 mm right lower lobe nodule. If the patient is at high risk  for bronchogenic carcinoma, follow-up chest CT at 1 year is recommended. If the patient is at low risk, no follow-up is needed. This recommendation follows the consensus statement: Guidelines for Management of Small Pulmonary Nodules Detected on CT Scans: A Statement from the Fleischner Society as published in Radiology 2005; 237:395-400. Electronically Signed   By: Amie Portland M.D.   On: 09/02/2015 10:17   Ct Abdomen Pelvis  W Contrast  09/28/2015  CLINICAL DATA:  82 year old nursing home patient with current history of dementia presenting with acute onset of fever, left upper lobe quadrant and left lower quadrant abdominal pain, diarrhea, nausea and vomiting which began earlier today. EXAM: CT ABDOMEN AND PELVIS WITH CONTRAST TECHNIQUE: Multidetector CT imaging of the abdomen and pelvis was performed using the standard protocol following bolus administration of intravenous contrast. CONTRAST:  7m OMNIPAQUE IOHEXOL 300 MG/ML IV. The patient was unable to tolerate oral contrast material, so only a small amount of oral contrast was administered. COMPARISON:  Unenhanced CT abdomen pelvis 08/29/2015. FINDINGS: Lower chest: Small bilateral pleural effusions, new since the prior CT, with associated mild passive atelectasis in the lower lobes. Heart size normal with left atrial enlargement, mitral annular calcifications, aortic annular calcification and aortic valvular calcification. LAD and circumflex coronary artery atherosclerosis. Hepatobiliary: Calcified granuloma involving the dome of the liver. No significant abnormality involving the liver. Gallbladder normal in appearance without calcified gallstones. No biliary ductal dilation. Pancreas: Moderate pancreatic atrophy. No focal parenchymal abnormality. No peripancreatic inflammation. Spleen: Normal in size and appearance. Adrenals/Urinary Tract: Mild bilateral adrenal enlargement without nodularity. Atrophic right kidney. Multiple cortical cysts involving  both kidneys, 1 of which in the upper pole of the left kidney was hyperdense on the prior study. No solid mass involving either kidney. No urinary tract calculi. Moderate dilation of the left renal pelvis and mild dilation of the calices in left kidney, unchanged, without associated ureteral dilation. Normal-appearing decompressed urinary bladder. Stomach/Bowel: Large hiatal hernia as noted previously with approximately 2/3 of the stomach intrathoracic. Marked wall thickening involving the duodenal bulb, descending duodenum and proximal transverse duodenum, new since the prior CT. Remaining small bowel unremarkable. Moderate diffuse colonic wall thickening from cecum to rectum, associated with edema/inflammation in the pericolonic fat, progressive since the prior CT. Sigmoid colon diverticulosis. No abnormal fluid collections. Normal appendix in the right upper pelvis. No ascites. Vascular/Lymphatic: Severe aorto-iliofemoral atherosclerosis without aneurysm. Atherosclerosis at the origins of the celiac, SMA and IMA, associated with stenoses, though all 3 arteries are patent. No visible branch vessel occlusions. Atherosclerosis at the origins of both renal arteries, also with stenosis. No pathologic lymphadenopathy. Reproductive: Atrophic uterus and right ovary. Left ovarian cyst measuring approximately 1.5 x 2.8 x 2.7 cm, decreased in size since the prior CT. Other: Small umbilical hernia containing fat.  Sarcopenia. Musculoskeletal: Severe degenerative disc disease, spondylosis and facet degenerative changes throughout the lower thoracic and lumbar spine with thoracolumbar scoliosis convex right. Osseous demineralization. Symmetric moderate to severe osteoarthritis in both hips. Severe multifactorial spinal stenosis at L3-4 and L4-5. IMPRESSION: 1. Pancolitis, with edema/inflammation involving the entire colon from cecum to rectum. This has progressed since the prior CT approximately 1 month ago. 2. Duodenitis  involving the duodenal bulb, descending duodenum and proximal transverse duodenum, new since the CT 1 month ago. 3. No evidence of bowel perforation or abscess. 4. New small bilateral pleural effusions and associated mild passive atelectasis in the lower lobes. 5. Severe generalized atherosclerosis, with possible hemodynamically significant stenoses involving all 3 visceral arteries and both renal arteries, though all of these vessels are patent. 6. Large hiatal hernia as noted previously. 7. Chronic left UPJ stenosis. 8. Atrophic right kidney. 9. Left ovarian cyst which has decreased in size since the CT 1 month ago, therefore benign. 10. Musculoskeletal findings as above, in particular severe multifactorial spinal stenosis at L3-4 L4-5. Electronically Signed   By: TEvangeline DakinM.D.   On:  09/28/2015 11:19   Dg Chest Portable 1 View  09/28/2015  CLINICAL DATA:  Low-grade fever with tachycardia. EXAM: PORTABLE CHEST 1 VIEW COMPARISON:  09/02/2015 FINDINGS: 0926 hours. Lung volumes are low. Stable asymmetric elevation right hemidiaphragm. There is left base airspace disease compatible with atelectasis or pneumonia. Tiny left pleural effusion persist. The cardio pericardial silhouette is enlarged. The visualized bony structures of the thorax are intact. Telemetry leads overlie the chest. IMPRESSION: Cardiomegaly with interval slight increase in left base atelectasis or pneumonia. Persistent small left pleural effusion. Electronically Signed   By: Misty Stanley M.D.   On: 09/28/2015 09:36   Dg Chest Port 1 View  09/02/2015  CLINICAL DATA:  Shortness of breath. EXAM: PORTABLE CHEST 1 VIEW COMPARISON:  11/09/2014 .  07/11/2014.  04/24/2014. FINDINGS: Stable fullness of the superior mediastinum consistent prominent vascularity noted . Right hilar fullness noted. This may be related atelectasis and or infiltrate right perihilar region however a right hilar mass and/or adenopathy cannot be excluded. Follow-up PA and  lateral chest x-ray suggested. Small left pleural effusion. No pneumothorax . Heart size is normal. No acute osseous abnormality identified . IMPRESSION: 1. Right hilar fullness. Although this may be related to atelectasis and/or infiltrate in the right perihilar region, right hilar/perihilar mass cannot be excluded. Follow-up PA and lateral chest x-ray suggested. 2. Small left pleural effusion. Electronically Signed   By: Marcello Moores  Register   On: 09/02/2015 07:45    Assessment:   Kathi Ludwig Khatib is a 82 y.o. female with recurrent diarrhea and C diff + stools. She finished oral vanco 1/14.  She had wbc 14.9 on admit and severe anemia with heme + stools.  CT shows pan colitis, duodenitis and severe athersclerosis. Not sure her abd sxs are all related to C diff. Up to 70% of long term care residents will have C diff + stools. Could she have underlying malignancy or mesenteric ischemia?  Recommendations Continue IV flagyl and oral vanco Agree with checking IG levels.   Thank you very much for allowing me to participate in the care of this patient. Please call with questions.   Cheral Marker. Ola Spurr, MD

## 2015-09-29 NOTE — Progress Notes (Signed)
[facility] - [place] at [facility]   PATIENT NAME: Melissa Cooper    MR#:  [id number]  DATE OF BIRTH:  1918-06-08  SUBJECTIVE:  CHIEF COMPLAINT:   Chief Complaint  Patient presents with  . Emesis   no complaint, but hemoglobin decreased to 6.6 today, no diarrhea today, no melena, or bloody stool  REVIEW OF SYSTEMS:  CONSTITUTIONAL: No fever, has poor oral intake and generalized weakness.  EYES: No blurred or double vision.  EARS, NOSE, AND THROAT: No tinnitus or ear pain.  RESPIRATORY: No cough, shortness of breath, wheezing or hemoptysis.  CARDIOVASCULAR: No chest pain, orthopnea, edema.  GASTROINTESTINAL: No nausea, vomiting, diarrhea or abdominal pain.  GENITOURINARY: No dysuria, hematuria.  ENDOCRINE: No polyuria, nocturia,  HEMATOLOGY: No anemia, easy bruising or bleeding SKIN: No rash or lesion. MUSCULOSKELETAL: No joint pain or arthritis.   NEUROLOGIC: No tingling, numbness, weakness.  PSYCHIATRY: No anxiety or depression.   DRUG ALLERGIES:   Allergies  Allergen Reactions  . Actonel [Risedronate Sodium] Other (See Comments)    Unknown reaction  . Ceftin [Cefuroxime Axetil] Other (See Comments)    Unknown reaction  . Evista [Raloxifene] Other (See Comments)    Unknown reaction  . Miacalcin [Calcitonin ([place])] Other (See Comments)    Unknown reaction  . Zyrtec [Cetirizine] Other (See Comments)    Unknown reaction    VITALS:  Blood pressure 111/59, pulse 111, temperature 98.5 F (36.9 C), temperature source Oral, resp. rate 20, height  (1.6 m), weight 62.596 kg (138 lb), SpO2 100 %.  PHYSICAL EXAMINATION:  GENERAL:  82 y.o.-year-old patient lying in the bed with no acute distress.  EYES: Pupils equal, round, reactive to light and accommodation. No scleral icterus. Pale conjunctiva Extraocular muscles intact.  HEENT: Head atraumatic, normocephalic. Oropharynx and nasopharynx clear.  NECK:  Supple, no jugular venous distention. No  thyroid enlargement, no tenderness.  LUNGS: Normal breath sounds bilaterally, no wheezing, rales,rhonchi or crepitation. No use of accessory muscles of respiration.  CARDIOVASCULAR: S1, S2 normal. No murmurs, rubs, or gallops.  ABDOMEN: Soft, nontender, nondistended. Bowel sounds present. No organomegaly or mass.  EXTREMITIES: No pedal edema, cyanosis, or clubbing.  NEUROLOGIC: Cranial nerves II through XII are intact. Muscle strength 3-4/5 in all extremities. Sensation intact. Gait not checked.  PSYCHIATRIC: The patient is alert and oriented x 3.  SKIN: No obvious rash, lesion, or ulcer.    LABORATORY PANEL:   CBC  Recent Labs Lab 09/29/15 0343  WBC 10.0  HGB 6.6*  HCT 21.8*  PLT 296   ------------------------------------------------------------------------------------------------------------------  Chemistries   Recent Labs Lab 09/28/15 0825 09/29/15 0343  NA 139 140  K 3.3* 4.2  CL 101 104  CO2 29 27  GLUCOSE 123* 88  BUN 28* 25*  CREATININE 0.96 0.91  CALCIUM 7.8* 7.4*  AST 13*  --   ALT 7*  --   ALKPHOS 73  --   BILITOT 0.8  --    ------------------------------------------------------------------------------------------------------------------  Cardiac Enzymes No results for input(s): TROPONINI in the last 168 hours. ------------------------------------------------------------------------------------------------------------------  RADIOLOGY:  Ct Abdomen Pelvis W Contrast  09/28/2015  CLINICAL DATA:  82 year old nursing home patient with current history of dementia presenting with acute onset of fever, left upper lobe quadrant and left lower quadrant abdominal pain, diarrhea, nausea and vomiting which began earlier today. EXAM: CT ABDOMEN AND PELVIS WITH CONTRAST TECHNIQUE: Multidetector CT imaging of the abdomen and pelvis was performed using the standard protocol following bolus administration of intravenous contrast.  CONTRAST:  75mL OMNIPAQUE IOHEXOL 300  MG/ML IV. The patient was unable to tolerate oral contrast material, so only a small amount of oral contrast was administered. COMPARISON:  Unenhanced CT abdomen pelvis 08/29/2015. FINDINGS: Lower chest: Small bilateral pleural effusions, new since the prior CT, with associated mild passive atelectasis in the lower lobes. Heart size normal with left atrial enlargement, mitral annular calcifications, aortic annular calcification and aortic valvular calcification. LAD and circumflex coronary artery atherosclerosis. Hepatobiliary: Calcified granuloma involving the dome of the liver. No significant abnormality involving the liver. Gallbladder normal in appearance without calcified gallstones. No biliary ductal dilation. Pancreas: Moderate pancreatic atrophy. No focal parenchymal abnormality. No peripancreatic inflammation. Spleen: Normal in size and appearance. Adrenals/Urinary Tract: Mild bilateral adrenal enlargement without nodularity. Atrophic right kidney. Multiple cortical cysts involving both kidneys, 1 of which in the upper pole of the left kidney was hyperdense on the prior study. No solid mass involving either kidney. No urinary tract calculi. Moderate dilation of the left renal pelvis and mild dilation of the calices in left kidney, unchanged, without associated ureteral dilation. Normal-appearing decompressed urinary bladder. Stomach/Bowel: Large hiatal hernia as noted previously with approximately 2/3 of the stomach intrathoracic. Marked wall thickening involving the duodenal bulb, descending duodenum and proximal transverse duodenum, new since the prior CT. Remaining small bowel unremarkable. Moderate diffuse colonic wall thickening from cecum to rectum, associated with edema/inflammation in the pericolonic fat, progressive since the prior CT. Sigmoid colon diverticulosis. No abnormal fluid collections. Normal appendix in the right upper pelvis. No ascites. Vascular/Lymphatic: Severe aorto-iliofemoral  atherosclerosis without aneurysm. Atherosclerosis at the origins of the celiac, SMA and IMA, associated with stenoses, though all 3 arteries are patent. No visible branch vessel occlusions. Atherosclerosis at the origins of both renal arteries, also with stenosis. No pathologic lymphadenopathy. Reproductive: Atrophic uterus and right ovary. Left ovarian cyst measuring approximately 1.5 x 2.8 x 2.7 cm, decreased in size since the prior CT. Other: Small umbilical hernia containing fat.  Sarcopenia. Musculoskeletal: Severe degenerative disc disease, spondylosis and facet degenerative changes throughout the lower thoracic and lumbar spine with thoracolumbar scoliosis convex right. Osseous demineralization. Symmetric moderate to severe osteoarthritis in both hips. Severe multifactorial spinal stenosis at L3-4 and L4-5. IMPRESSION: 1. Pancolitis, with edema/inflammation involving the entire colon from cecum to rectum. This has progressed since the prior CT approximately 1 month ago. 2. Duodenitis involving the duodenal bulb, descending duodenum and proximal transverse duodenum, new since the CT 1 month ago. 3. No evidence of bowel perforation or abscess. 4. New small bilateral pleural effusions and associated mild passive atelectasis in the lower lobes. 5. Severe generalized atherosclerosis, with possible hemodynamically significant stenoses involving all 3 visceral arteries and both renal arteries, though all of these vessels are patent. 6. Large hiatal hernia as noted previously. 7. Chronic left UPJ stenosis. 8. Atrophic right kidney. 9. Left ovarian cyst which has decreased in size since the CT 1 month ago, therefore benign. 10. Musculoskeletal findings as above, in particular severe multifactorial spinal stenosis at L3-4 L4-5. Electronically Signed   By: Hulan Saas M.D.   On: 09/28/2015 11:19   Dg Chest Portable 1 View  09/28/2015  CLINICAL DATA:  Low-grade fever with tachycardia. EXAM: PORTABLE CHEST 1 VIEW  COMPARISON:  09/02/2015 FINDINGS: 0926 hours. Lung volumes are low. Stable asymmetric elevation right hemidiaphragm. There is left base airspace disease compatible with atelectasis or pneumonia. Tiny left pleural effusion persist. The cardio pericardial silhouette is enlarged. The visualized bony structures of the thorax  are intact. Telemetry leads overlie the chest. IMPRESSION: Cardiomegaly with interval slight increase in left base atelectasis or pneumonia. Persistent small left pleural effusion. Electronically Signed   By: Kennith Center M.D.   On: 09/28/2015 09:36    EKG:   Orders placed or performed during the hospital encounter of 08/29/15  . ED EKG  . ED EKG  . EKG 12-Lead  . 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 -CT of the abdomen showing pancolitis with inflammation which has progressed since last month. There is also evidence of duodenitis. -since she was treated with vancomycin last month, started on Flagyl at this time. Might consider Fidaxomicin. Follow-up GI and infectious disease consult requested. Family also questioning about fecal transplant. -Avoid other antibiotics. Started on probiotics as well. -Started on dysphagia diet as tolerated.  #2 bibasilar atelectasis-noted on CT abdomen at lung bases. Avoid antibiotics if she doesn't have to. Has dry cough with no increase in productive cough. -Just encourage incentive spirometry at this time. Closely monitor.  #3 chronic anemia-known history of pernicious and iron deficiency anemia. Continue her iron and B12 supplements -Hemoglobin dropped from 8.8 to 8.0, but to 6.6 today. Gave PRBC transfusion 2 units today and follow-up CBC in a.m.  #4 CKD stage III-stable. Continue to monitor   All the records are reviewed and case discussed with Care Management/Social Workerr. Management plans discussed with the patient, her son and they are in agreement.  CODE STATUS: DO NOT  RESUSCITATE.  TOTAL TIME TAKING CARE OF THIS PATIENT: 38 minutes.  Greater than 50% time was spent on coordination of care and face-to-face counseling.  POSSIBLE D/C IN 2-3 DAYS, DEPENDING ON CLINICAL CONDITION.   Shaune Pollack M.D on 09/29/2015 at 2:52 PM  Between 7am to 6pm - Pager - [phone]  After 6pm go to [email or web address] - password [email or web address] [facility]  [facility]  Office  [phone]  CC: Primary care physician; Tillman Abide, MD

## 2015-09-29 NOTE — Progress Notes (Signed)
Clinical Child psychotherapist (CSW) contacted Music therapist at [facility]. Per Sue Lush patient is a long term care resident at [facility] and can return when stable. CSW will continue to follow and assist as needed.   Jetta Lout, LCSW [phone]

## 2015-09-30 DIAGNOSIS — E43 Unspecified severe protein-calorie malnutrition: Secondary | ICD-10-CM | POA: Insufficient documentation

## 2015-09-30 LAB — CBC
HEMATOCRIT: 32.1 % — AB (ref 35.0–47.0)
HEMOGLOBIN: 10.2 g/dL — AB (ref 12.0–16.0)
MCH: 25.6 pg — ABNORMAL LOW (ref 26.0–34.0)
MCHC: 31.7 g/dL — ABNORMAL LOW (ref 32.0–36.0)
MCV: 80.7 fL (ref 80.0–100.0)
Platelets: 317 10*3/uL (ref 150–440)
RBC: 3.97 MIL/uL (ref 3.80–5.20)
RDW: 21 % — AB (ref 11.5–14.5)
WBC: 14.5 10*3/uL — AB (ref 3.6–11.0)

## 2015-09-30 LAB — TYPE AND SCREEN
ABO/RH(D): O NEG
ANTIBODY SCREEN: NEGATIVE
UNIT DIVISION: 0
Unit division: 0

## 2015-09-30 LAB — PROTEIN ELECTROPHORESIS, SERUM
A/G Ratio: 0.9 (ref 0.7–1.7)
Albumin ELP: 2 g/dL — ABNORMAL LOW (ref 2.9–4.4)
Alpha-1-Globulin: 0.4 g/dL (ref 0.0–0.4)
Alpha-2-Globulin: 0.7 g/dL (ref 0.4–1.0)
Beta Globulin: 0.7 g/dL (ref 0.7–1.3)
Gamma Globulin: 0.4 g/dL (ref 0.4–1.8)
Globulin, Total: 2.2 g/dL (ref 2.2–3.9)
Total Protein ELP: 4.2 g/dL — ABNORMAL LOW (ref 6.0–8.5)

## 2015-09-30 LAB — BASIC METABOLIC PANEL
ANION GAP: 7 (ref 5–15)
BUN: 23 mg/dL — ABNORMAL HIGH (ref 6–20)
CHLORIDE: 108 mmol/L (ref 101–111)
CO2: 28 mmol/L (ref 22–32)
Calcium: 7.7 mg/dL — ABNORMAL LOW (ref 8.9–10.3)
Creatinine, Ser: 1 mg/dL (ref 0.44–1.00)
GFR, EST AFRICAN AMERICAN: 54 mL/min — AB (ref >60)
GFR, EST NON AFRICAN AMERICAN: 46 mL/min — AB (ref >60)
Glucose, Bld: 110 mg/dL — ABNORMAL HIGH (ref 65–99)
POTASSIUM: 4.9 mmol/L (ref 3.5–5.1)
SODIUM: 143 mmol/L (ref 135–145)

## 2015-09-30 NOTE — Evaluation (Signed)
Physical Therapy Evaluation Patient Details Name: Melissa Cooper MRN: [id number] DOB: 03/18/1918 Today's Date: 09/30/2015   History of Present Illness  Melissa Cooper  is a 82 y.o. female with a known history of mild to moderate dementia, hypertension, hyperlipidemia, arthritis, chronic anemia and CKD stage III who was recently admitted to the hospital from 08/29/2015 to 09/05/2015 her C. difficile colitis and discharged to [facility] skilled nursing facility is brought back secondary to diarrhea and abdominal pain. Patient was here 3 weeks ago with similar complaints and CT of the abdomen at that time showed pancolitis and she was started on by mouth vancomycin and was discharged on the same. Her appetite has not gotten back to her baseline according to family, her diarrhea and abdominal pain were improved at the time of discharge. She also started having some cough here in the hospital at the time and was changed over to dysphagia diet. Patient hasn't had any episodes of aspiration but has been having some occasional dry cough. She finished her antibiotic no more than 2 weeks ago. For the last 3 days she's been complaining of intermittent abdominal pain especially after eating. Denies any nausea vomiting or hematemesis. She started having diarrhea again since yesterday and was brought to the emergency room. CT of the abdomen again shows diffuse pancolitis and C. difficile is tested positive. Pt is AOx2 at time of evaluation however she is disoriented to her current living situation at [facility]. Pt is disoriented to time and situation. Pt unable to answer questions regarding prior level of function or fall history. Pt history felt to be of poor reliability.   Clinical Impression  Pt demonstrates confusion throughout session so history from patient is unreliable. However she is able to follow simple commands appropriately at least 75% of the time and participates well with bed exercises. Pt requires  considerable assistance for bed mobility. Max+2 for attempted sit to stand without pt unable to come to full standing. Pt is highly fearful of falling as well as deconditioned/weak. Pt shoulder return to [facility] and would benefit from therapy in order to work toward goals of increasing LE strength to improve safety with transfers, decrease fall risk, and decrease caregiver burden. Pt will benefit from skilled PT services to address deficits in strength, balance, and mobility in order to return to full function at facility.     Follow Up Recommendations SNF (Return to [facility])    Equipment Recommendations  None recommended by PT    Recommendations for Other Services Rehab consult     Precautions / Restrictions Precautions Precautions: Fall Restrictions Weight Bearing Restrictions: No      Mobility  Bed Mobility Overal bed mobility: Needs Assistance Bed Mobility: Supine to Sit;Sit to Supine     Supine to sit: Mod assist Sit to supine: Mod assist (+2 to scoot up toward [facility])   General bed mobility comments: Pt able to roll onto R side but requires modA to go from R sidelying to sitting. Also requires modA to scoot up toward edge of bed prior to transfers  Transfers Overall transfer level: Needs assistance Equipment used: Rolling walker (2 wheeled) Transfers: Sit to/from Stand Sit to Stand: Max assist;+2 physical assistance         General transfer comment: Attempted sit to stand transfer with maxA+2 however pt unable to fully come to standing. Pt requires heavy verbal and tactile cues. She is very fearful of falls and has difficulty shifting weight anteriorly.  Ambulation/Gait             General Gait Details: Unable to perform at this time  Stairs            Wheelchair Mobility    Modified Rankin (Stroke Patients Only)       Balance Overall balance assessment: Needs assistance Sitting-balance support: No upper extremity supported Sitting  balance-Leahy Scale: Fair     Standing balance support: Bilateral upper extremity supported Standing balance-Leahy Scale: Poor Standing balance comment: Unable to fully come to standing                             Pertinent Vitals/Pain Pain Assessment: Faces Faces Pain Scale: No hurt    Home Living Family/patient expects to be discharged to:: Skilled nursing facility                 Additional Comments: Pt unable to participate in discharge discussion    Prior Function Level of Independence: Needs assistance         Comments: Pt unable to provide information. Prior level of functioning unclear but appears that pt was able to perform limited standing with rolling walker based on medical record. Unsure     Hand Dominance        Extremity/Trunk Assessment   Upper Extremity Assessment: Generalized weakness (Limited bilateral shoulder flexion)           Lower Extremity Assessment: Generalized weakness (Grossly 4- to 4/5 throughout)         Communication   Communication: HOH  Cognition Arousal/Alertness: Awake/alert Behavior During Therapy: Anxious Overall Cognitive Status: No family/caregiver present to determine baseline cognitive functioning (History of basleline dementia, 75% command follow)       Memory: Decreased short-term memory (AOx2. Does not know that she has been at [facility])              General Comments      Exercises General Exercises - Lower Extremity Ankle Circles/Pumps: Strengthening;Both;10 reps;Supine Short Arc Quad: Strengthening;Both;10 reps;Supine Heel Slides: Strengthening;Both;10 reps;Supine Hip ABduction/ADduction: Strengthening;Both;10 reps;Supine Straight Leg Raises: Strengthening;Both;10 reps;Supine      Assessment/Plan    PT Assessment Patient needs continued PT services  PT Diagnosis Difficulty walking;Abnormality of gait;Generalized weakness   PT Problem List Decreased strength;Decreased  activity tolerance;Decreased balance;Decreased mobility;Decreased cognition;Decreased knowledge of use of DME;Decreased safety awareness  PT Treatment Interventions DME instruction;Gait training;Functional mobility training;Therapeutic activities;Therapeutic exercise;Balance training;Neuromuscular re-education;Cognitive remediation;Patient/family education;Manual techniques   PT Goals (Current goals can be found in the Care Plan section) Acute Rehab PT Goals PT Goal Formulation: Patient unable to participate in goal setting    Frequency Min 2X/week   Barriers to discharge        Co-evaluation               End of Session Equipment Utilized During Treatment: Gait belt Activity Tolerance: Other (comment) (Limited partially by confusion) Patient left: in bed;with call bell/phone within reach;with bed alarm set;with nursing/sitter in room Nurse Communication: Mobility status         Time: 1610-9604 PT Time Calculation (min) (ACUTE ONLY): 30 min   Charges:   PT Evaluation $PT Eval Moderate Complexity: 1 Procedure PT Treatments $Therapeutic Exercise: 8-22 mins   PT G Codes:       Sharalyn Ink Huprich PT, DPT   Huprich,Jason 09/30/2015, 11:04 AM

## 2015-09-30 NOTE — Care Management Important Message (Signed)
Important Message  Patient Details  Name: ARLETA OSTRUM MRN: [id number] Date of Birth: [birth year]   Medicare Important Message Given:  Yes    Collie Siad, RN 09/30/2015, 10:33 AM

## 2015-09-30 NOTE — Progress Notes (Signed)
Advancing diet per Dr. Imogene Burn.

## 2015-09-30 NOTE — Progress Notes (Signed)
Per PT patient is appropriate to receive PT at [facility]. Clinical Child psychotherapist (CSW) contacted [employer] at [facility] and made her aware of above. CSW also made Sue Lush aware that patient is positive for C-diff. Per Sue Lush patient was receiving PT and will go to a private room. CSW will continue to follow and assist as needed.   Jetta Lout, LCSW [phone]

## 2015-09-30 NOTE — Progress Notes (Signed)
Patient has had two loose stools this shift.  Complains of abdominal pain this afternoon.  Tolerated water on several attempts but got choked up after one try, was able to eventually clear airway on her own.  Changed to NPO pending speech swallow evaluation.

## 2015-09-30 NOTE — Consult Note (Signed)
Pt with C. Diff diarrhea.  No pain on palpation, bowel sounds good.  Would expect ischemic colitis to have signif pain and tenderness.  WBC 14.5, hgb 10.2  Chest clear, heart RRR, abd non tender.  A: C. Diff recurrent.  May try gamma globulin infusion.  Reluctant to do colonoscopy and stool transplant on 82 year old, but not out of the question.

## 2015-09-30 NOTE — Progress Notes (Addendum)
[facility] - [place] at [facility]   PATIENT NAME: Melissa Cooper    MR#:  [id number]  DATE OF BIRTH:  November 25, 1917  SUBJECTIVE:  CHIEF COMPLAINT:   Chief Complaint  Patient presents with  . Emesis   no complaint, but had diarrhea 3 times since last night per RN.  REVIEW OF SYSTEMS:  CONSTITUTIONAL: No fever, has poor oral intake and generalized weakness.  EYES: No blurred or double vision.  EARS, NOSE, AND THROAT: No tinnitus or ear pain.  RESPIRATORY: No cough, shortness of breath, wheezing or hemoptysis.  CARDIOVASCULAR: No chest pain, orthopnea, edema.  GASTROINTESTINAL: No nausea, vomiting, or abdominal pain. But had diarrhea. GENITOURINARY: No dysuria, hematuria.  ENDOCRINE: No polyuria, nocturia,  HEMATOLOGY: No anemia, easy bruising or bleeding SKIN: No rash or lesion. MUSCULOSKELETAL: No joint pain or arthritis.   NEUROLOGIC: No tingling, numbness, weakness.  PSYCHIATRY: No anxiety or depression.   DRUG ALLERGIES:   Allergies  Allergen Reactions  . Actonel [Risedronate Sodium] Other (See Comments)    Unknown reaction  . Ceftin [Cefuroxime Axetil] Other (See Comments)    Unknown reaction  . Evista [Raloxifene] Other (See Comments)    Unknown reaction  . Miacalcin [Calcitonin ([place])] Other (See Comments)    Unknown reaction  . Zyrtec [Cetirizine] Other (See Comments)    Unknown reaction    VITALS:  Blood pressure 143/80, pulse 110, temperature 97.6 F (36.4 C), temperature source Oral, resp. rate 18, height  (1.6 m), weight 62.596 kg (138 lb), SpO2 92 %.  PHYSICAL EXAMINATION:  GENERAL:  82 y.o.-year-old patient lying in the bed with no acute distress.  EYES: Pupils equal, round, reactive to light and accommodation. No scleral icterus. Pale conjunctiva Extraocular muscles intact.  HEENT: Head atraumatic, normocephalic. Oropharynx and nasopharynx clear.  NECK:  Supple, no jugular venous distention. No thyroid enlargement, no  tenderness.  LUNGS: Normal breath sounds bilaterally, no wheezing, rales,rhonchi or crepitation. No use of accessory muscles of respiration.  CARDIOVASCULAR: S1, S2 normal. No murmurs, rubs, or gallops.  ABDOMEN: Soft, nontender, nondistended. Bowel sounds present. No organomegaly or mass.  EXTREMITIES: No pedal edema, cyanosis, or clubbing.  NEUROLOGIC: Cranial nerves II through XII are intact. Muscle strength 3-4/5 in all extremities. Sensation intact. Gait not checked.  PSYCHIATRIC: The patient is alert and oriented x 3.  SKIN: No obvious rash, lesion, or ulcer.     LABORATORY PANEL:   CBC  Recent Labs Lab 09/30/15 0426  WBC 14.5*  HGB 10.2*  HCT 32.1*  PLT 317   ------------------------------------------------------------------------------------------------------------------  Chemistries   Recent Labs Lab 09/28/15 0825  09/30/15 0426  NA 139  < > 143  K 3.3*  < > 4.9  CL 101  < > 108  CO2 29  < > 28  GLUCOSE 123*  < > 110*  BUN 28*  < > 23*  CREATININE 0.96  < > 1.00  CALCIUM 7.8*  < > 7.7*  AST 13*  --   --   ALT 7*  --   --   ALKPHOS 73  --   --   BILITOT 0.8  --   --   < > = values in this interval not displayed. ------------------------------------------------------------------------------------------------------------------  Cardiac Enzymes No results for input(s): TROPONINI in the last 168 hours. ------------------------------------------------------------------------------------------------------------------  RADIOLOGY:  No results found.  EKG:   Orders placed or performed during the hospital encounter of 08/29/15  . ED EKG  . ED EKG  . EKG 12-Lead  .  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 -CT of the abdomen showing pancolitis with inflammation which has progressed since last month. There is also evidence of duodenitis. -since she was treated with vancomycin last month. She was started on  Flagyl on this admission.  Per Dr. Markham Jordan, started vancomycin po.  -Avoid other antibiotics. Started on probiotics as well. Advanced to soft diet.  #2 bibasilar atelectasis-noted on CT abdomen at lung bases. Avoid antibiotics if she doesn't have to. Just encourage incentive spirometry at this time.  #3 chronic anemia-known history of pernicious and iron deficiency anemia. Continue her iron and B12 supplements -Hemoglobin dropped from 8.8 to 8.0 then to 6.6. Given PRBC transfusion 2 units, Hb 10.2 today, follow-up CBC in a.m. Per Dr. Markham Jordan, she has hx of very heme positive stool and RUQ/epigastric abd pain so recommend PPi bid iv for now.  #4 CKD stage III-stable.   All the records are reviewed and case discussed with Care Management/Social Workerr. Management plans discussed with the patient, her son and they are in agreement.  CODE STATUS: DO NOT RESUSCITATE.  TOTAL TIME TAKING CARE OF THIS PATIENT: 38 minutes.  Greater than 50% time was spent on coordination of care and face-to-face counseling.  POSSIBLE D/C IN 2 DAYS, DEPENDING ON CLINICAL CONDITION.   Shaune Pollack M.D on 09/30/2015 at 2:52 PM  Between 7am to 6pm - Pager - [phone]  After 6pm go to [email or web address] - password [email or web address]  [facility]  Office  [phone]  CC: Primary care physician; Tillman Abide, MD

## 2015-09-30 NOTE — Progress Notes (Signed)
Paged Dr. Imogene Burn, to advise if we can progress her diet to mechanical soft and thin liquids as the patient has not been witnessed to have any coughing episodes and is eager to drink and tolerating a straw.

## 2015-10-01 LAB — BASIC METABOLIC PANEL
Anion gap: 5 (ref 5–15)
BUN: 17 mg/dL (ref 6–20)
CALCIUM: 7.3 mg/dL — AB (ref 8.9–10.3)
CHLORIDE: 113 mmol/L — AB (ref 101–111)
CO2: 24 mmol/L (ref 22–32)
CREATININE: 0.81 mg/dL (ref 0.44–1.00)
GFR, EST NON AFRICAN AMERICAN: 60 mL/min — AB (ref >60)
Glucose, Bld: 102 mg/dL — ABNORMAL HIGH (ref 65–99)
POTASSIUM: 3.2 mmol/L — AB (ref 3.5–5.1)
Sodium: 142 mmol/L (ref 135–145)

## 2015-10-01 LAB — CBC
HCT: 31.3 % — ABNORMAL LOW (ref 35.0–47.0)
Hemoglobin: 10.1 g/dL — ABNORMAL LOW (ref 12.0–16.0)
MCH: 25.7 pg — ABNORMAL LOW (ref 26.0–34.0)
MCHC: 32.2 g/dL (ref 32.0–36.0)
MCV: 79.8 fL — ABNORMAL LOW (ref 80.0–100.0)
PLATELETS: 297 10*3/uL (ref 150–440)
RBC: 3.93 MIL/uL (ref 3.80–5.20)
RDW: 21 % — AB (ref 11.5–14.5)
WBC: 13.6 10*3/uL — AB (ref 3.6–11.0)

## 2015-10-01 LAB — MAGNESIUM: Magnesium: 1.3 mg/dL — ABNORMAL LOW (ref 1.7–2.4)

## 2015-10-01 MED ORDER — ENOXAPARIN SODIUM 40 MG/0.4ML ~~LOC~~ SOLN
40.0000 mg | SUBCUTANEOUS | Status: DC
  Administered 2015-10-01 – 2015-10-02 (×2): 40 mg via SUBCUTANEOUS
  Filled 2015-10-01 (×2): qty 0.4

## 2015-10-01 MED ORDER — MAGNESIUM SULFATE 4 GM/100ML IV SOLN
4.0000 g | Freq: Once | INTRAVENOUS | Status: AC
  Administered 2015-10-01: 4 g via INTRAVENOUS
  Filled 2015-10-01: qty 100

## 2015-10-01 MED ORDER — POTASSIUM CHLORIDE CRYS ER 20 MEQ PO TBCR
40.0000 meq | EXTENDED_RELEASE_TABLET | Freq: Once | ORAL | Status: AC
  Administered 2015-10-01: 40 meq via ORAL
  Filled 2015-10-01: qty 2

## 2015-10-01 NOTE — Consult Note (Signed)
Patient SPIE showed normal level of gamma globulin so will not do infusion at this time.

## 2015-10-01 NOTE — Consult Note (Signed)
Pt awake and alert, she was reported by nurse's aid that patient had some choking with swallowing yesterday and a speech path person is supposed to evaluate her today.  She swallowed a little water ok for me this morning. Abd non tender, no masses, no HSM.  Stools still loose.  On flagyl and vancomycin. No new suggestions at this time.

## 2015-10-01 NOTE — Progress Notes (Signed)
Patient cooperative with care this shift.  Speech evaluation completed and appropriate diet ordered.  Family instructed on how to assist patient with feeding.

## 2015-10-01 NOTE — Progress Notes (Signed)
Patient ordered Lovenox 30 mg daily for DVT prophylaxis. CrCl is now > 30 ml/min, will increase Lovenox dose to 40 mg daily.   Clovia Cuff, PharmD, BCPS 10/01/2015 1:58 PM

## 2015-10-01 NOTE — Progress Notes (Signed)
[facility] INFECTIOUS DISEASE PROGRESS NOTE Date of Admission:  09/28/2015     ID: Melissa Cooper is a 82 y.o. female with  Recurrent C diff  Active Problems:   Recurrent colitis due to Clostridium difficile   Protein-calorie malnutrition, severe   Subjective: Less diarrhea, no fevers  ROS  Eleven systems are reviewed and negative except per hpi  Medications:  Antibiotics Given (last 72 hours)    Date/Time Action Medication Dose Rate   09/28/15 1537 Given   metroNIDAZOLE (FLAGYL) IVPB 500 mg 500 mg 100 mL/hr   09/28/15 2206 Given   metroNIDAZOLE (FLAGYL) IVPB 500 mg 500 mg 100 mL/hr   09/29/15 0458 Given   metroNIDAZOLE (FLAGYL) IVPB 500 mg 500 mg 100 mL/hr   09/29/15 1415 Given   metroNIDAZOLE (FLAGYL) IVPB 500 mg 500 mg 100 mL/hr   09/29/15 2007 Given   vancomycin (VANCOCIN) 50 mg/mL oral solution 250 mg 250 mg    09/29/15 2107 Given   metroNIDAZOLE (FLAGYL) IVPB 500 mg 500 mg 100 mL/hr   09/29/15 2305 Given   vancomycin (VANCOCIN) 50 mg/mL oral solution 250 mg 250 mg    09/30/15 0439 Given   vancomycin (VANCOCIN) 50 mg/mL oral solution 250 mg 250 mg    09/30/15 0440 Given   metroNIDAZOLE (FLAGYL) IVPB 500 mg 500 mg 100 mL/hr   09/30/15 1138 Given   vancomycin (VANCOCIN) 50 mg/mL oral solution 250 mg 250 mg    09/30/15 1406 Given   metroNIDAZOLE (FLAGYL) IVPB 500 mg 500 mg 100 mL/hr   09/30/15 1719 Given   vancomycin (VANCOCIN) 50 mg/mL oral solution 250 mg 250 mg    09/30/15 2100 Given   metroNIDAZOLE (FLAGYL) IVPB 500 mg 500 mg 100 mL/hr   10/01/15 0407 Given   metroNIDAZOLE (FLAGYL) IVPB 500 mg 500 mg 100 mL/hr   10/01/15 1128 Given   vancomycin (VANCOCIN) 50 mg/mL oral solution 250 mg 250 mg    10/01/15 1403 Given   metroNIDAZOLE (FLAGYL) IVPB 500 mg 500 mg 100 mL/hr     . acidophilus  1 capsule Oral BID  . aspirin  81 mg Oral Daily  . enoxaparin (LOVENOX) injection  40 mg Subcutaneous Q24H  . ferrous fumarate  106 mg of iron Oral Daily  .  metronidazole  500 mg Intravenous Q8H  . pantoprazole (PROTONIX) IV  40 mg Intravenous Q12H  . vancomycin  250 mg Oral 4 times per day  . vitamin B-12  1,000 mcg Oral Daily    Objective: Vital signs in last 24 hours: Temp:  [96.1 F (35.6 C)-98.4 F (36.9 C)] 96.1 F (35.6 C) (02/01 0904) Pulse Rate:  [103-110] 105 (02/01 0904) Resp:  [17-18] 17 (02/01 0904) BP: (132-160)/(62-86) 148/86 mmHg (02/01 0904) SpO2:  [93 %-95 %] 95 % (02/01 0904) Constitutional: Frail, lying in bed,  HENT: [place]/AT, PERRLA, no scleral icterus Mouth/Throat: Oropharynx is clear and dry . No oropharyngeal exudate.  Cardiovascular: Normal rate,  Pulmonary/Chest: Effort normal and breath sounds normal. No respiratory distress. has no wheezes.  Neck = supple, no nuchal rigidity Abdominal: Soft. Bowel sounds are normal. exhibits no distension. There is no tenderness.  Lymphadenopathy: no cervical adenopathy. No axillary adenopathy Neurological: able to wak eup and answer yes no questions Skin: Skin is warm and dry. No rash noted. No erythema.  Psychiatric: lethargic.   Lab Results  Recent Labs  09/30/15 0426 10/01/15 0520  WBC 14.5* 13.6*  HGB 10.2* 10.1*  HCT 32.1* 31.3*  NA 143 142  K 4.9 3.2*  CL 108 113*  CO2 28 24  BUN 23* 17  CREATININE 1.00 0.81    Microbiology: Results for orders placed or performed during the hospital encounter of 09/28/15  C difficile quick scan w PCR reflex     Status: Abnormal   Collection Time: 09/28/15  9:32 AM  Result Value Ref Range Status   C Diff antigen POSITIVE (A) NEGATIVE Final   C Diff toxin POSITIVE (A) NEGATIVE Final   C Diff interpretation   Final    Positive for toxigenic C. difficile, active toxin production present.    Comment: CRITICAL RESULT CALLED TO, READ BACK BY AND VERIFIED WITH: AMBER JONES 09/28/15 AT 1114 BY HS   Blood culture (routine x 2)     Status: None (Preliminary result)   Collection Time: 09/28/15  9:52 AM  Result Value  Ref Range Status   Specimen Description BLOOD LEFT HAND  Final   Special Requests BOTTLES DRAWN AEROBIC AND ANAEROBIC  4CC  Final   Culture NO GROWTH 2 DAYS  Final   Report Status PENDING  Incomplete  Blood culture (routine x 2)     Status: None (Preliminary result)   Collection Time: 09/28/15 10:05 AM  Result Value Ref Range Status   Specimen Description BLOOD LEFT ANTECUBITAL  Final   Special Requests BOTTLES DRAWN AEROBIC AND ANAEROBIC  5CC  Final   Culture NO GROWTH 2 DAYS  Final   Report Status PENDING  Incomplete    Studies/Results: Ct Chest Wo Contrast  09/02/2015  CLINICAL DATA:  Followup to current chest radiograph. Chest radiograph showed right hilar fullness. EXAM: CT CHEST WITHOUT CONTRAST TECHNIQUE: Multidetector CT imaging of the chest was performed following the standard protocol without IV contrast. COMPARISON:  09/02/2015. FINDINGS: Neck base and axilla: No mass or adenopathy. Thyroid is unremarkable. Mediastinum and hila: Heart normal in size and configuration. There are moderate coronary artery calcifications. Trace pericardial fluid is noted. Moderate size hiatal hernia. No mediastinal or hilar masses or enlarged lymph nodes. Lungs and pleura: Small right pleural effusion. There is bronchial wall thickening and adjacent linear and reticular opacity most evident in the lower lobes, right greater than left, and base of the right middle lobe and left upper lobe lingula, consistent with a combination of chronic bronchial wall thickening and associated subsegmental atelectasis no evidence of pneumonia or pulmonary edema. There is a 4 mm nodule in the right lower lobe, superior segment. Mild scarring is noted at the apices. Limited upper abdomen: Small amount of ascites. This has developed since the abdomen and pelvis CT dated 08/29/2015. Musculoskeletal: Diffuse bony demineralization. Significant degenerative changes are noted throughout the thoracic spine. Old healed mid sternal  fracture. No osteoblastic or osteolytic lesions. IMPRESSION: 1. The fullness of the right hilum noted on the prior chest radiograph was due to prominence of the right hilar vasculature accentuated by low lung volumes. There is no mediastinal or hilar mass or adenopathy. 2. Small right pleural effusion. Mild dependent subsegmental atelectasis. No evidence of pneumonia or pulmonary edema. 3. Small amount ascites has developed since the prior abdomen and pelvis CT. 4. 4 mm right lower lobe nodule. If the patient is at high risk for bronchogenic carcinoma, follow-up chest CT at 1 year is recommended. If the patient is at low risk, no follow-up is needed. This recommendation follows the consensus statement: Guidelines for Management of Small Pulmonary Nodules Detected on CT Scans: A Statement from the Fleischner Society as published in Radiology  2005; 811:914-782. Electronically Signed   By: Amie Portland M.D.   On: 09/02/2015 10:17   Ct Abdomen Pelvis W Contrast  09/28/2015  CLINICAL DATA:  82 year old nursing home patient with current history of dementia presenting with acute onset of fever, left upper lobe quadrant and left lower quadrant abdominal pain, diarrhea, nausea and vomiting which began earlier today. EXAM: CT ABDOMEN AND PELVIS WITH CONTRAST TECHNIQUE: Multidetector CT imaging of the abdomen and pelvis was performed using the standard protocol following bolus administration of intravenous contrast. CONTRAST:  75mL OMNIPAQUE IOHEXOL 300 MG/ML IV. The patient was unable to tolerate oral contrast material, so only a small amount of oral contrast was administered. COMPARISON:  Unenhanced CT abdomen pelvis 08/29/2015. FINDINGS: Lower chest: Small bilateral pleural effusions, new since the prior CT, with associated mild passive atelectasis in the lower lobes. Heart size normal with left atrial enlargement, mitral annular calcifications, aortic annular calcification and aortic valvular calcification. LAD and  circumflex coronary artery atherosclerosis. Hepatobiliary: Calcified granuloma involving the dome of the liver. No significant abnormality involving the liver. Gallbladder normal in appearance without calcified gallstones. No biliary ductal dilation. Pancreas: Moderate pancreatic atrophy. No focal parenchymal abnormality. No peripancreatic inflammation. Spleen: Normal in size and appearance. Adrenals/Urinary Tract: Mild bilateral adrenal enlargement without nodularity. Atrophic right kidney. Multiple cortical cysts involving both kidneys, 1 of which in the upper pole of the left kidney was hyperdense on the prior study. No solid mass involving either kidney. No urinary tract calculi. Moderate dilation of the left renal pelvis and mild dilation of the calices in left kidney, unchanged, without associated ureteral dilation. Normal-appearing decompressed urinary bladder. Stomach/Bowel: Large hiatal hernia as noted previously with approximately 2/3 of the stomach intrathoracic. Marked wall thickening involving the duodenal bulb, descending duodenum and proximal transverse duodenum, new since the prior CT. Remaining small bowel unremarkable. Moderate diffuse colonic wall thickening from cecum to rectum, associated with edema/inflammation in the pericolonic fat, progressive since the prior CT. Sigmoid colon diverticulosis. No abnormal fluid collections. Normal appendix in the right upper pelvis. No ascites. Vascular/Lymphatic: Severe aorto-iliofemoral atherosclerosis without aneurysm. Atherosclerosis at the origins of the celiac, SMA and IMA, associated with stenoses, though all 3 arteries are patent. No visible branch vessel occlusions. Atherosclerosis at the origins of both renal arteries, also with stenosis. No pathologic lymphadenopathy. Reproductive: Atrophic uterus and right ovary. Left ovarian cyst measuring approximately 1.5 x 2.8 x 2.7 cm, decreased in size since the prior CT. Other: Small umbilical hernia  containing fat.  Sarcopenia. Musculoskeletal: Severe degenerative disc disease, spondylosis and facet degenerative changes throughout the lower thoracic and lumbar spine with thoracolumbar scoliosis convex right. Osseous demineralization. Symmetric moderate to severe osteoarthritis in both hips. Severe multifactorial spinal stenosis at L3-4 and L4-5. IMPRESSION: 1. Pancolitis, with edema/inflammation involving the entire colon from cecum to rectum. This has progressed since the prior CT approximately 1 month ago. 2. Duodenitis involving the duodenal bulb, descending duodenum and proximal transverse duodenum, new since the CT 1 month ago. 3. No evidence of bowel perforation or abscess. 4. New small bilateral pleural effusions and associated mild passive atelectasis in the lower lobes. 5. Severe generalized atherosclerosis, with possible hemodynamically significant stenoses involving all 3 visceral arteries and both renal arteries, though all of these vessels are patent. 6. Large hiatal hernia as noted previously. 7. Chronic left UPJ stenosis. 8. Atrophic right kidney. 9. Left ovarian cyst which has decreased in size since the CT 1 month ago, therefore benign. 10. Musculoskeletal findings as above,  in particular severe multifactorial spinal stenosis at L3-4 L4-5. Electronically Signed   By: Hulan Saas M.D.   On: 09/28/2015 11:19   Dg Chest Portable 1 View  09/28/2015  CLINICAL DATA:  Low-grade fever with tachycardia. EXAM: PORTABLE CHEST 1 VIEW COMPARISON:  09/02/2015 FINDINGS: 0926 hours. Lung volumes are low. Stable asymmetric elevation right hemidiaphragm. There is left base airspace disease compatible with atelectasis or pneumonia. Tiny left pleural effusion persist. The cardio pericardial silhouette is enlarged. The visualized bony structures of the thorax are intact. Telemetry leads overlie the chest. IMPRESSION: Cardiomegaly with interval slight increase in left base atelectasis or pneumonia.  Persistent small left pleural effusion. Electronically Signed   By: Kennith Center M.D.   On: 09/28/2015 09:36   Dg Chest Port 1 View  09/02/2015  CLINICAL DATA:  Shortness of breath. EXAM: PORTABLE CHEST 1 VIEW COMPARISON:  11/09/2014 .  07/11/2014.  04/24/2014. FINDINGS: Stable fullness of the superior mediastinum consistent prominent vascularity noted . Right hilar fullness noted. This may be related atelectasis and or infiltrate right perihilar region however a right hilar mass and/or adenopathy cannot be excluded. Follow-up PA and lateral chest x-ray suggested. Small left pleural effusion. No pneumothorax . Heart size is normal. No acute osseous abnormality identified . IMPRESSION: 1. Right hilar fullness. Although this may be related to atelectasis and/or infiltrate in the right perihilar region, right hilar/perihilar mass cannot be excluded. Follow-up PA and lateral chest x-ray suggested. 2. Small left pleural effusion. Electronically Signed   By: Maisie Fus  Register   On: 09/02/2015 07:45     Assessment/Plan: Melissa Cooper is a 82 y.o. female with recurrent diarrhea and C diff + stools. She finished oral vanco 1/14. She had wbc 14.9 on admit and severe anemia with heme + stools.  CT shows pan colitis, duodenitis and severe athersclerosis. Clinically improving. Dr Mechele Collin does not think this is likely mesenteric ischemia  Recommendations Continue IV flagyl and oral vanco At Dc would use oral vanco  May need fecal transplant if recurs - defer to Dr Mechele Collin Thank you very much for the consult. Will follow with you.  Trena Dunavan   10/01/2015, 3:08 PM

## 2015-10-01 NOTE — Progress Notes (Signed)
[facility] - [place] at [facility]   PATIENT NAME: Melissa Cooper    MR#:  [id number]  DATE OF BIRTH:  [birth year]  SUBJECTIVE:  CHIEF COMPLAINT:   Chief Complaint  Patient presents with  . Emesis   no complaint.  REVIEW OF SYSTEMS:  CONSTITUTIONAL: No fever, better oral intake and generalized weakness.  EYES: No blurred or double vision.  EARS, NOSE, AND THROAT: No tinnitus or ear pain.  RESPIRATORY: No cough, shortness of breath, wheezing or hemoptysis.  CARDIOVASCULAR: No chest pain, orthopnea, edema.  GASTROINTESTINAL: No nausea, vomiting, or abdominal pain. No diarrhea. GENITOURINARY: No dysuria, hematuria.  ENDOCRINE: No polyuria, nocturia,  HEMATOLOGY: No anemia, easy bruising or bleeding SKIN: No rash or lesion. MUSCULOSKELETAL: No joint pain or arthritis.   NEUROLOGIC: No tingling, numbness, weakness.  PSYCHIATRY: No anxiety or depression.   DRUG ALLERGIES:   Allergies  Allergen Reactions  . Actonel [Risedronate Sodium] Other (See Comments)    Unknown reaction  . Ceftin [Cefuroxime Axetil] Other (See Comments)    Unknown reaction  . Evista [Raloxifene] Other (See Comments)    Unknown reaction  . Miacalcin [Calcitonin ([place]mon)] Other (See Comments)    Unknown reaction  . Zyrtec [Cetirizine] Other (See Comments)    Unknown reaction    VITALS:  Blood pressure 132/78, pulse 105, temperature 94.4 F (34.7 C), temperature source Oral, resp. rate 18, height  (1.6 m), weight 62.596 kg (138 lb), SpO2 95 %.  PHYSICAL EXAMINATION:  GENERAL:  82 y.o.-year-old patient lying in the bed with no acute distress.  EYES: Pupils equal, round, reactive to light and accommodation. No scleral icterus. Pale conjunctiva Extraocular muscles intact.  HEENT: Head atraumatic, normocephalic. Oropharynx and nasopharynx clear.  NECK:  Supple, no jugular venous distention. No thyroid enlargement, no tenderness.  LUNGS: Normal breath sounds bilaterally, no  wheezing, rales,rhonchi or crepitation. No use of accessory muscles of respiration.  CARDIOVASCULAR: S1, S2 normal. No murmurs, rubs, or gallops.  ABDOMEN: Soft, nontender, nondistended. Bowel sounds present. No organomegaly or mass.  EXTREMITIES: No pedal edema, cyanosis, or clubbing.  NEUROLOGIC: Cranial nerves II through XII are intact. Muscle strength 3-4/5 in all extremities. Sensation intact. Gait not checked.  PSYCHIATRIC: The patient is alert and oriented x 3.  SKIN: No obvious rash, lesion, or ulcer.     LABORATORY PANEL:   CBC  Recent Labs Lab 10/01/15 0520  WBC 13.6*  HGB 10.1*  HCT 31.3*  PLT 297   ------------------------------------------------------------------------------------------------------------------  Chemistries   Recent Labs Lab 09/28/15 0825  10/01/15 0520  NA 139  < > 142  K 3.3*  < > 3.2*  CL 101  < > 113*  CO2 29  < > 24  GLUCOSE 123*  < > 102*  BUN 28*  < > 17  CREATININE 0.96  < > 0.81  CALCIUM 7.8*  < > 7.3*  MG  --   --  1.3*  AST 13*  --   --   ALT 7*  --   --   ALKPHOS 73  --   --   BILITOT 0.8  --   --   < > = values in this interval not displayed. ------------------------------------------------------------------------------------------------------------------  Cardiac Enzymes No results for input(s): TROPONINI in the last 168 hours. ------------------------------------------------------------------------------------------------------------------  RADIOLOGY:  No results found.  EKG:   Orders placed or performed during the hospital encounter of 08/29/15  . ED EKG  . ED EKG  . EKG 12-Lead  . EKG  12-Lead    ASSESSMENT AND PLAN:   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 -CT of the abdomen showing pancolitis with inflammation which has progressed since last month. There is also evidence of duodenitis. -since she was treated with vancomycin last month. She was started on Flagyl on this admission.  Per Dr.  Markham Jordan, continue vancomycin po for total 14 days.  -Avoid other antibiotics. Started on probiotics as well. Advanced to soft diet.  #2 bibasilar atelectasis-noted on CT abdomen at lung bases. Avoid antibiotics if she doesn't have to. Just encourage incentive spirometry at this time.  #3 chronic anemia-known history of pernicious and iron deficiency anemia. Continue her iron and B12 supplements -Hemoglobin dropped from 8.8 to 8.0 then to 6.6. Given PRBC transfusion 2 units, Hb 10.1 today, follow-up CBC in a.m. Per Dr. Markham Jordan, she has hx of very heme positive stool and RUQ/epigastric abd pain so recommend PPi bid for now.  #4 CKD stage III-stable.  Hypomagnesemia. IV mag, f/u level. Hypokalemia. KCl, f/u BMP.  I discussed with Dr. Markham Jordan and Dr. Sampson Goon. All the records are reviewed and case discussed with Care Management/Social Workerr. Management plans discussed with the patient, her son and they are in agreement.  CODE STATUS: DO NOT RESUSCITATE.  TOTAL TIME TAKING CARE OF THIS PATIENT: 33 minutes.  Greater than 50% time was spent on coordination of care and face-to-face counseling.  POSSIBLE D/C IN 2 DAYS, DEPENDING ON CLINICAL CONDITION.   Shaune Pollack M.D on 10/01/2015 at 4:19 PM  Between 7am to 6pm - Pager - [phone]  After 6pm go to [email or web address] - password [email or web address] [facility]  [place] [facility]  Office  [phone]  CC: Primary care physician; Tillman Abide, MD

## 2015-10-01 NOTE — Evaluation (Signed)
Clinical/Bedside Swallow Evaluation Patient Details  Name: Melissa Cooper MRN: [id number] Date of Birth: 01-13-1918  Today's Date: 10/01/2015 Time: SLP Start Time (ACUTE ONLY): 0900 SLP Stop Time (ACUTE ONLY): 1000 SLP Time Calculation (min) (ACUTE ONLY): 60 min  Past Medical History:  Past Medical History  Diagnosis Date  . CKD (chronic kidney disease)     stage 3  . Hypercholesteremia   . HTN (hypertension)   . OSA (obstructive sleep apnea)   . PA (pernicious anemia)   . Osteoarthritis   . Dementia   . Osteoarthritis    Past Surgical History:  Past Surgical History  Procedure Laterality Date  . None     HPI:  Pt is a 82 y.o. female with a known history of mild to moderate dementia, hypertension, hyperlipidemia, arthritis, chronic anemia and CKD stage III who was recently admitted to the hospital from 08/29/2015 to 09/05/2015 her C. difficile colitis and discharged to [facility] skilled nursing facility is brought back secondary to diarrhea and abdominal pain. Pt denies having any dysphagia or trouble swallowing liquids; Son reported pt does not "like" the thickened liquids. He stated he preferred that she have "regular water again". He denied any consistent, overt s/s of aspiration when eating/drinking at meals at [facility]. Pt has been on thickened liquids briefly while admitted but wants to drink water.    Assessment / Plan / Recommendation Clinical Impression  Pt appeared to adequately tolerate trials of thin liquids via cup following aspiration precautions of small, single sips - slowly. Also reduced distractions in room during trials. No decline in respiratory status and clear vocal quality was noted b/t trials. Min. increased oral phase time w/ mastication of increased texture food was noted but pt cleared appropriately given time w/ trials. Pt required feeding assistance; confusion noted at baseline. Son present in room described oral phase issues of oral holding(inconsistent at [facility])  which could be related to her declined Cognitive status/Dementia. Pt appears at reduced-mild risk(Dementia; age) for aspiration following general aspiration precautions as discussed and posted in room.     Aspiration Risk  Mild aspiration risk    Diet Recommendation  Dys. 2 w/ thin liquids; general aspiration precautions; NO straws. Feeding assistance.   Medication Administration: Whole meds with puree    Other  Recommendations Recommended Consults:  (Dietician) Oral Care Recommendations: Oral care BID;Staff/trained caregiver to provide oral care   Follow up Recommendations  Skilled Nursing facility (TBD)    Frequency and Duration min 2x/week  2 weeks       Prognosis Prognosis for Safe Diet Advancement: Fair Barriers to Reach Goals: Cognitive deficits      Swallow Study   General Date of Onset: 09/28/15 HPI: Pt is a 82 y.o. female with a known history of mild to moderate dementia, hypertension, hyperlipidemia, arthritis, chronic anemia and CKD stage III who was recently admitted to the hospital from 08/29/2015 to 09/05/2015 her C. difficile colitis and discharged to [facility] [place] skilled nursing facility is brought back secondary to diarrhea and abdominal pain. Pt denies having any dysphagia or trouble swallowing liquids; Son reported pt does not "like" the thickened liquids. He stated he preferred that she have "regular water again". He denied any consistent, overt s/s of aspiration when eating/drinking at meals at [facility]. Pt has been on thickened liquids briefly while admitted but wants to drink water.  Type of Study: Bedside Swallow Evaluation Previous Swallow Assessment: none Diet Prior to this Study: Dysphagia 2 (chopped);Thin liquids Temperature Spikes Noted:  No (wbc 13.6) Respiratory Status: Nasal cannula (2 liters) History of Recent Intubation: No Behavior/Cognition: Alert;Cooperative;Pleasant mood;Confused;Requires cueing;Distractible Oral Cavity Assessment: Dry Oral Care  Completed by SLP: Yes Oral Cavity - Dentition: Missing dentition Vision:  (n/a) Self-Feeding Abilities: Total assist Patient Positioning: Upright in bed Baseline Vocal Quality: Normal (pt is HOH) Volitional Cough: Strong Volitional Swallow: Able to elicit    Oral/Motor/Sensory Function Overall Oral Motor/Sensory Function: Within functional limits   Ice Chips Ice chips: Within functional limits Presentation: Spoon (fed; 3 trials)   Thin Liquid Thin Liquid: Within functional limits Presentation: Cup;Self Fed (4 trial sets w/ single - multiple sips each set)    Nectar Thick Nectar Thick Liquid: Not tested   Honey Thick Honey Thick Liquid: Not tested   Puree Puree: Within functional limits Presentation: Spoon (fed; 6 trials)   Solid   GO   Solid: Impaired Presentation: Spoon (fed; 2 trials of crumbled cracker in puree) Oral Phase Impairments:  (increased mastication time w/ boluses) Pharyngeal Phase Impairments:  (none) Other Comments: pt cleared given time w/ trials       ,Jerilynn Som, MS, CCC-SLP  Watson,Katherine 10/01/2015,4:17 PM

## 2015-10-01 NOTE — Progress Notes (Signed)
PT Attempt Note  Patient Details Name: Melissa Cooper MRN: [id number] DOB: 05-Jan-1918   Cancelled Treatment:     Chart reviewed and RN consulted. Attempted to see patient however CNA is assisting with feeding pt dinner. Will attempt PT treatment on a different date as she is appropriate and available.  Sharalyn Ink Huprich PT, DPT   Huprich,Jason 10/01/2015, 5:14 PM

## 2015-10-02 LAB — CBC
HCT: 31.2 % — ABNORMAL LOW (ref 35.0–47.0)
Hemoglobin: 10 g/dL — ABNORMAL LOW (ref 12.0–16.0)
MCH: 25.2 pg — ABNORMAL LOW (ref 26.0–34.0)
MCHC: 32.2 g/dL (ref 32.0–36.0)
MCV: 78.3 fL — ABNORMAL LOW (ref 80.0–100.0)
PLATELETS: 426 10*3/uL (ref 150–440)
RBC: 3.98 MIL/uL (ref 3.80–5.20)
RDW: 21.6 % — AB (ref 11.5–14.5)
WBC: 17.4 10*3/uL — AB (ref 3.6–11.0)

## 2015-10-02 LAB — BASIC METABOLIC PANEL
ANION GAP: 5 (ref 5–15)
BUN: 16 mg/dL (ref 6–20)
CALCIUM: 7.3 mg/dL — AB (ref 8.9–10.3)
CHLORIDE: 110 mmol/L (ref 101–111)
CO2: 27 mmol/L (ref 22–32)
CREATININE: 0.88 mg/dL (ref 0.44–1.00)
GFR calc non Af Amer: 54 mL/min — ABNORMAL LOW (ref >60)
Glucose, Bld: 127 mg/dL — ABNORMAL HIGH (ref 65–99)
Potassium: 4.2 mmol/L (ref 3.5–5.1)
SODIUM: 142 mmol/L (ref 135–145)

## 2015-10-02 LAB — MAGNESIUM: MAGNESIUM: 2.2 mg/dL (ref 1.7–2.4)

## 2015-10-02 MED ORDER — METRONIDAZOLE 500 MG PO TABS
500.0000 mg | ORAL_TABLET | Freq: Three times a day (TID) | ORAL | Status: DC
  Administered 2015-10-02 – 2015-10-06 (×11): 500 mg via ORAL
  Filled 2015-10-02 (×11): qty 1

## 2015-10-02 NOTE — Care Management Important Message (Signed)
Important Message  Patient Details  Name: ZANAIYA CALABRIA MRN: [id number] Date of Birth: 1917-12-07   Medicare Important Message Given:  Yes    Collie Siad, RN 10/02/2015, 8:36 AM

## 2015-10-02 NOTE — Consult Note (Signed)
Hgb steady at 10 but had large movements with blood, likely bled 2 days ago as her eyes and tongue color look good. abd soft and non tender.  Discussed situation with her son.  No colonoscopic plans at this time, will follow with you.

## 2015-10-02 NOTE — Progress Notes (Signed)
Patient is having bloody stools. Dr. called, order for morning labs to be drawn now. Lab called, will continue to monitor.

## 2015-10-02 NOTE — Progress Notes (Signed)
PT Hold Note  Patient Details Name: Melissa Cooper MRN: [id number] DOB: Aug 28, 1918   Cancelled Treatment:    Reason Eval/Treat Not Completed: Medical issues which prohibited therapy. Chart reviewed and RN consulted. RN report that pt has been bleeding. Attempted to see patient however she refuses to work with physical therapy. Son is present who asks for assist in repositioning pt. In process of repositioning large amount of blood observed in bed. RN notified of blood and comes to assess. Will attempt PT treatment ag later date as pt is appropriate.  Sharalyn Ink Huprich PT, DPT   Huprich,Jason 10/02/2015, 4:55 PM

## 2015-10-02 NOTE — Progress Notes (Signed)
Per RN in progression rounds patient is not ready for D/C today. Per MD's note patient may be ready for D/C tomorrow. [facility] admissions coordinator at [facility] is aware of above. Clinical Social Worker (CSW) will continue to follow and assist as needed.   Jetta Lout, LCSW [phone]

## 2015-10-02 NOTE — Progress Notes (Signed)
[facility] - [place] at [facility]   PATIENT NAME: Melissa Cooper    MR#:  [id number]  DATE OF BIRTH:  November 25, 1917  SUBJECTIVE:  CHIEF COMPLAINT:   Chief Complaint  Patient presents with  . Emesis   -Admitted with recurrent C. difficile colitis. Noted bloody stools last night. None this morning. Hemoglobin is stable. -White count elevated. -Patient is more comfortable and appears more stable.  REVIEW OF SYSTEMS:  Review of Systems  Constitutional: Negative for fever and chills.  HENT: Positive for hearing loss. Negative for ear discharge, ear pain and nosebleeds.   Eyes: Negative for blurred vision and photophobia.  Respiratory: Negative for cough, shortness of breath and wheezing.   Cardiovascular: Negative for chest pain and palpitations.  Gastrointestinal: Positive for blood in stool. Negative for nausea, vomiting, abdominal pain, diarrhea and constipation.  Genitourinary: Negative for dysuria and frequency.  Musculoskeletal: Negative for myalgias.  Neurological: Positive for weakness. Negative for dizziness, sensory change, speech change, focal weakness, seizures and headaches.  Psychiatric/Behavioral: Positive for memory loss. Negative for depression.    DRUG ALLERGIES:   Allergies  Allergen Reactions  . Actonel [Risedronate Sodium] Other (See Comments)    Unknown reaction  . Ceftin [Cefuroxime Axetil] Other (See Comments)    Unknown reaction  . Evista [Raloxifene] Other (See Comments)    Unknown reaction  . Miacalcin [Calcitonin ([place])] Other (See Comments)    Unknown reaction  . Zyrtec [Cetirizine] Other (See Comments)    Unknown reaction    VITALS:  Blood pressure 138/68, pulse 104, temperature 97.3 F (36.3 C), temperature source Oral, resp. rate 17, height  (1.6 m), weight 62.596 kg (138 lb), SpO2 96 %.  PHYSICAL EXAMINATION:  Physical Exam  GENERAL: 82 y.o.-year-old elderly patient lying in the bed with no acute distress.   EYES: Pupils equal, round, reactive to light and accommodation. No scleral icterus. Extraocular muscles intact.  HEENT: Head atraumatic, normocephalic. Oropharynx and nasopharynx clear.  NECK: Supple, no jugular venous distention. No thyroid enlargement, no tenderness.  LUNGS: Normal breath sounds bilaterally, no wheezing, rales,rhonchi or crepitation. No use of accessory muscles of respiration. Decreased bibasilar breath sounds. CARDIOVASCULAR: S1, S2 normal. No rubs, or gallops. 3/6 systolic murmur present. ABDOMEN: Soft, nontender, nondistended. Bowel sounds present. No organomegaly or mass.  EXTREMITIES: No pedal edema, cyanosis, or clubbing.  NEUROLOGIC: Cranial nerves II through XII are intact. Muscle strength 5/5 in all extremities. Sensation intact. Gait not checked. Following simple commands PSYCHIATRIC: The patient is alert and oriented x 2.  SKIN: No obvious rash, lesion, or ulcer.    LABORATORY PANEL:   CBC  Recent Labs Lab 10/02/15 0250  WBC 17.4*  HGB 10.0*  HCT 31.2*  PLT 426   ------------------------------------------------------------------------------------------------------------------  Chemistries   Recent Labs Lab 09/28/15 0825  10/02/15 0250  NA 139  < > 142  K 3.3*  < > 4.2  CL 101  < > 110  CO2 29  < > 27  GLUCOSE 123*  < > 127*  BUN 28*  < > 16  CREATININE 0.96  < > 0.88  CALCIUM 7.8*  < > 7.3*  MG  --   < > 2.2  AST 13*  --   --   ALT 7*  --   --   ALKPHOS 73  --   --   BILITOT 0.8  --   --   < > = values in this interval not displayed. ------------------------------------------------------------------------------------------------------------------  Cardiac Enzymes No  results for input(s): TROPONINI in the last 168 hours. ------------------------------------------------------------------------------------------------------------------  RADIOLOGY:  No results found.  EKG:   Orders placed or performed during the hospital  encounter of 08/29/15  . ED EKG  . ED EKG  . EKG 12-Lead  . EKG 12-Lead    ASSESSMENT AND PLAN:   Melissa Cooper is a 82 y.o. female with a known history of mild to moderate dementia, hypertension, hyperlipidemia, arthritis, chronic anemia and CKD stage III who was recently admitted to the hospital from 08/29/2015 to 09/05/2015 her C. difficile colitis and discharged to [facility] skilled nursing facility is brought back secondary to diarrhea and abdominal pain.  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 -CT of the abdomen showing pancolitis with inflammation which has progressed since last month. There is also evidence of duodenitis. -Appreciate GI and ID consults - on vanc orally and also IV flagyl -Avoid other antibiotics. on probiotics as well. -bloody stools last night, likely from her diffuse colitis. White count elevated today. No further diarrhea noted. -We'll check with GI and ID prior to discharge-possible discharge tomorrow  #2 bibasilar atelectasis-noted on CT abdomen at lung bases. Will avoid antibiotics if she doesn't have to. Has dry cough with no increase in productive cough. -Just encourage incentive spirometry at this time. Closely monitor.  #3 chronic anemia-known history of pernicious and iron deficiency anemia. Continue her iron and B12 supplements -Hemoglobin dropped on admission and patient received 2 units of transfusion and now hb stable at 10  #4 CKD stage III-stable. Continue to monitor  #5 DVT prophylaxis-on Lovenox  Patient is from [facility] [place] and will go back to [facility] at discharge   All the records are reviewed and case discussed with Care Management/Social Workerr. Management plans discussed with the patient, family and they are in agreement.  CODE STATUS: DNR  TOTAL TIME TAKING CARE OF THIS PATIENT: 38 minutes.   POSSIBLE D/C IN 1 DAYS, DEPENDING ON CLINICAL CONDITION.   Enid Baas M.D on 10/02/2015 at 10:14 AM  Between  7am to 6pm - Pager - [phone]  After 6pm go to [email or web address] - password [email or web address] [facility]  [place] [facility]  Office  [phone]  CC: Primary care physician; Tillman Abide, MD

## 2015-10-02 NOTE — Progress Notes (Signed)
Speech Language Pathology Treatment: Dysphagia  Patient Details Name: Melissa Cooper MRN: [id number] DOB: [birth year] Today's Date: 10/02/2015 Time: 9417-4081 SLP Time Calculation (min) (ACUTE ONLY): 30 min  Assessment / Plan / Recommendation Clinical Impression  Pt appears to present w/ adequate toleration of current diet of Dys. 2 w/ thin liquids, however, continues to exhibit min. confusion (baseline) which can increase risk for dysphagia and aspiration. NSG stated pt is doing "very well" w/ the diet consistency w/ no s/s of aspiration w/ the thin liquids following aspiration precautions. Educational tx session w/ pt re: general aspiration precautions and practiced drinking from the cup - NO Straws. Discussed and reviewed aspiration precautions and feeding support and facilitation during meals w/ NSG. Rec. Dys. 2 diet w/ thin liquids; aspiration precautions; feeding assistance and meds in Puree - crushed as nec./able. ST will be available for f/u for further education while admitted if [facility]. NSG updated.   HPI HPI: Pt is a 82 y.o. female with a known history of mild to moderate dementia, hypertension, hyperlipidemia, arthritis, chronic anemia and CKD stage III who was recently admitted to the hospital from 08/29/2015 to 09/05/2015 her C. difficile colitis and discharged to [facility] skilled nursing facility is brought back secondary to diarrhea and abdominal pain. Pt denies having any dysphagia or trouble swallowing liquids; Son reported pt does not "like" the thickened liquids. He stated he preferred that she have "regular water again". He denied any consistent, overt s/s of aspiration when eating/drinking at meals at [facility]. Pt has been on thickened liquids briefly while admitted but diet consistency was changed and pt was placed back on thin liquids. No reports of dysphagia w/ the thin liquids via cup following aspiration precautions (per NSG) and pt.        SLP Plan  All goals met      Recommendations  Diet recommendations: Thin liquid;Dysphagia 2 (fine chop) (may have pancakes per SLP; NO pureed bread) Liquids provided via: Cup Medication Administration: Whole meds with puree (or crushed as nec/able) Supervision: Patient able to self feed;Staff to assist with self feeding;Intermittent supervision to cue for compensatory strategies Compensations: Minimize environmental distractions;Slow rate;Small sips/bites;Follow solids with liquid Postural Changes and/or Swallow Maneuvers: Seated upright 90 degrees             Oral Care Recommendations: Oral care BID;Staff/trained caregiver to provide oral care Follow up Recommendations: Skilled Nursing facility Plan: All goals met     [place], MS, CCC-SLP  Watson,Katherine 10/02/2015, 1:44 PM

## 2015-10-02 NOTE — Progress Notes (Signed)
Nutrition Follow-up  DOCUMENTATION CODES:   Severe malnutrition in context of acute illness/injury  INTERVENTION:   Meals and Snacks: Cater to patient preferences Medical Food Supplement Therapy: will add Magic Cup TID as pt really likes ice cream. Continue Mighty Shakes   NUTRITION DIAGNOSIS:   Inadequate oral intake related to altered GI function as evidenced by per patient/family report, percent weight loss.  GOAL:   Patient will meet greater than or equal to 90% of their needs  MONITOR:    (Energy intake, Digestive system)  REASON FOR ASSESSMENT:    (nectar thick liquids)    ASSESSMENT:    Pt remains on isolation for c.diff. CT abdomen with pancolitis and evidence of duodenitis per MD note.  Diet Order:  DIET DYS 2 Room service appropriate?: Yes with Assist; Fluid consistency:: Thin    Current Nutrition: Pt eating bites of lunch on visit. Pt reports not meaning to fall asleep. Difficult to clarify how appetite has been doing w pt. Limited documentation as pt on isolation.   Gastrointestinal Profile: Last BM: 10/02/2015 bloody loose stool    Scheduled Medications:  . acidophilus  1 capsule Oral BID  . aspirin  81 mg Oral Daily  . enoxaparin (LOVENOX) injection  40 mg Subcutaneous Q24H  . ferrous fumarate  106 mg of iron Oral Daily  . metroNIDAZOLE  500 mg Oral 3 times per day  . pantoprazole (PROTONIX) IV  40 mg Intravenous Q12H  . vancomycin  250 mg Oral 4 times per day  . vitamin B-12  1,000 mcg Oral Daily    Electrolyte/Renal Profile and Glucose Profile:   Recent Labs Lab 09/30/15 0426 10/01/15 0520 10/02/15 0250  NA 143 142 142  K 4.9 3.2* 4.2  CL 108 113* 110  CO2 BUN 23* 17 16  CREATININE 1.00 0.81 0.88  CALCIUM 7.7* 7.3* 7.3*  MG  --  1.3* 2.2  GLUCOSE 110* 102* 127*   Protein Profile:  Recent Labs Lab 09/28/15 0825  ALBUMIN 2.4*      Weight Trend since Admission: Filed Weights   09/28/15 0821 09/28/15 1334  Weight:  127 lb 3.3 oz (57.7 kg) 138 lb (62.596 kg)     BMI:  Body mass index is 24.45 kg/(m^2).  Estimated Nutritional Needs:   Kcal:  BEE 994 kcals (IF 1.0-1.2, AF 1.3) 1292-1550 kcals/d.   Protein:  (1.0-1.2 g/kg) 62-74 g/d  Fluid:  (25-74ml/kg) 1550-1840ml/d  EDUCATION NEEDS:   No education needs identified at this time   MODERATE Care Level  Leda Quail, RD, LDN Pager [phone] Weekend/On-Call Pager [phone]

## 2015-10-03 LAB — CULTURE, BLOOD (ROUTINE X 2)
CULTURE: NO GROWTH
Culture: NO GROWTH

## 2015-10-03 LAB — CBC
HEMATOCRIT: 24.9 % — AB (ref 35.0–47.0)
HEMOGLOBIN: 8 g/dL — AB (ref 12.0–16.0)
MCH: 25.6 pg — AB (ref 26.0–34.0)
MCHC: 32 g/dL (ref 32.0–36.0)
MCV: 80.1 fL (ref 80.0–100.0)
Platelets: 308 10*3/uL (ref 150–440)
RBC: 3.11 MIL/uL — ABNORMAL LOW (ref 3.80–5.20)
RDW: 22 % — ABNORMAL HIGH (ref 11.5–14.5)
WBC: 10.5 10*3/uL (ref 3.6–11.0)

## 2015-10-03 LAB — HEMOGLOBIN
Hemoglobin: 7.9 g/dL — ABNORMAL LOW (ref 12.0–16.0)
Hemoglobin: 7.4 g/dL — ABNORMAL LOW (ref 12.0–16.0)

## 2015-10-03 NOTE — Progress Notes (Signed)
[facility] - [place] at [facility]   PATIENT NAME: Melissa Cooper    MR#:  [id number]  DATE OF BIRTH:  04-Nov-1917  SUBJECTIVE:  CHIEF COMPLAINT:   Chief Complaint  Patient presents with  . Emesis   -Several bloody stools sec and since last night. Hemoglobin dropped from 10 to 8 today. -Patient denies any abdominal pain at this time. Sons at bedside  REVIEW OF SYSTEMS:  Review of Systems  Constitutional: Negative for fever and chills.  HENT: Positive for hearing loss. Negative for ear discharge, ear pain and nosebleeds.   Eyes: Negative for blurred vision and photophobia.  Respiratory: Negative for cough, shortness of breath and wheezing.   Cardiovascular: Negative for chest pain and palpitations.  Gastrointestinal: Positive for blood in stool. Negative for nausea, vomiting, abdominal pain, diarrhea and constipation.  Genitourinary: Negative for dysuria and frequency.  Musculoskeletal: Negative for myalgias.  Neurological: Positive for weakness. Negative for dizziness, sensory change, speech change, focal weakness, seizures and headaches.  Psychiatric/Behavioral: Positive for memory loss. Negative for depression.    DRUG ALLERGIES:   Allergies  Allergen Reactions  . Actonel [Risedronate Sodium] Other (See Comments)    Unknown reaction  . Ceftin [Cefuroxime Axetil] Other (See Comments)    Unknown reaction  . Evista [Raloxifene] Other (See Comments)    Unknown reaction  . Miacalcin [Calcitonin ([place]mon)] Other (See Comments)    Unknown reaction  . Zyrtec [Cetirizine] Other (See Comments)    Unknown reaction    VITALS:  Blood pressure 133/70, pulse 110, temperature 98 F (36.7 C), temperature source Oral, resp. rate 20, height  (1.6 m), weight 62.596 kg (138 lb), SpO2 98 %.  PHYSICAL EXAMINATION:  Physical Exam  GENERAL: 82 y.o.-year-old elderly patient lying in the bed with no acute distress.  EYES: Pupils equal, round, reactive to  light and accommodation. No scleral icterus. Extraocular muscles intact.  HEENT: Head atraumatic, normocephalic. Oropharynx and nasopharynx clear.  NECK: Supple, no jugular venous distention. No thyroid enlargement, no tenderness.  LUNGS: Normal breath sounds bilaterally, no wheezing, rales,rhonchi or crepitation. No use of accessory muscles of respiration. Decreased bibasilar breath sounds. CARDIOVASCULAR: S1, S2 normal. No rubs, or gallops. 3/6 systolic murmur present. ABDOMEN: Soft, nontender, nondistended. Bowel sounds present. No organomegaly or mass.  EXTREMITIES: No pedal edema, cyanosis, or clubbing.  NEUROLOGIC: Cranial nerves II through XII are intact. Muscle strength 5/5 in all extremities. Sensation intact. Gait not checked. Following simple commands PSYCHIATRIC: The patient is alert and oriented x 2.  SKIN: No obvious rash, lesion, or ulcer.    LABORATORY PANEL:   CBC  Recent Labs Lab 10/03/15 0426  WBC 10.5  HGB 8.0*  HCT 24.9*  PLT 308   ------------------------------------------------------------------------------------------------------------------  Chemistries   Recent Labs Lab 09/28/15 0825  10/02/15 0250  NA 139  < > 142  K 3.3*  < > 4.2  CL 101  < > 110  CO2 29  < > 27  GLUCOSE 123*  < > 127*  BUN 28*  < > 16  CREATININE 0.96  < > 0.88  CALCIUM 7.8*  < > 7.3*  MG  --   < > 2.2  AST 13*  --   --   ALT 7*  --   --   ALKPHOS 73  --   --   BILITOT 0.8  --   --   < > = values in this interval not displayed. ------------------------------------------------------------------------------------------------------------------  Cardiac Enzymes No results for  input(s): TROPONINI in the last 168 hours. ------------------------------------------------------------------------------------------------------------------  RADIOLOGY:  No results found.  EKG:   Orders placed or performed during the hospital encounter of 08/29/15  . ED EKG  . ED EKG  .  EKG 12-Lead  . EKG 12-Lead    ASSESSMENT AND PLAN:   Melissa Cooper is a 82 y.o. female with a known history of mild to moderate dementia, hypertension, hyperlipidemia, arthritis, chronic anemia and CKD stage III who was recently admitted to the hospital from 08/29/2015 to 09/05/2015 her C. difficile colitis and discharged to twin [facility] skilled nursing facility is brought back secondary to diarrhea and abdominal pain.  #1 Refractory C. difficile colitis- with bloody stools. Cannot rule out ischemic colitis. -CT of the abdomen showing pancolitis with inflammation which has progressed since last month. There is also evidence of duodenitis. -Appreciate GI and ID consults - on vanc orally and also flagyl -Avoid other antibiotics. on probiotics as well. -Awaiting GI input on how to proceed now with bloody stools. Hemoglobin dropped from 10 to 8. Transfuse if hb <7.5 - bleeding scan today -Maybe consider colonoscopy if all else is negative and patient continues to bleed- discussed with GI  #2 bibasilar atelectasis-noted on CT abdomen at lung bases. Will avoid antibiotics if she doesn't have to. Has dry cough with no increase in productive cough. -Just encourage incentive spirometry at this time. Closely monitor.  #3 chronic anemia-known history of pernicious and iron deficiency anemia. Continue her iron and B12 supplements -Hemoglobin dropped on admission and patient received 2 units of transfusion -Now with bloody stools, hemoglobin dropped again to 8  #4 CKD stage III-stable. Continue to monitor  #5 DVT prophylaxis-on Lovenox- discontinued due to bloody stools.  Patient is from [facility] [place] and will go back to [facility] [place] at discharge   All the records are reviewed and case discussed with Care Management/Social Workerr. Management plans discussed with the patient, family and they are in agreement.  CODE STATUS: DNR  TOTAL TIME TAKING CARE OF THIS PATIENT: 38 minutes.   POSSIBLE D/C IN  2 DAYS, DEPENDING ON CLINICAL CONDITION.   Anis Cinelli M.D on 10/03/2015 at 1:55 PM  Between 7am to 6pm - Pager - [phone]  After 6pm go to [email or web address] - password [email or web address] [facility]  [place] [facility]  Office  [phone]  CC: Primary care physician; Tillman Abide, MD

## 2015-10-03 NOTE — Progress Notes (Signed)
Physical Therapy Treatment Patient Details Name: Melissa Cooper MRN: [id number] DOB: 05-07-1918 Today's Date: 10/03/2015    History of Present Illness Melissa Cooper  is a 82 y.o. female with a known history of mild to moderate dementia, hypertension, hyperlipidemia, arthritis, chronic anemia and CKD stage III who was recently admitted to the hospital from 08/29/2015 to 09/05/2015 her C. difficile colitis and discharged to twin [facility] skilled nursing facility is brought back secondary to diarrhea and abdominal pain. Patient was here 3 weeks ago with similar complaints and CT of the abdomen at that time showed pancolitis and she was started on by mouth vancomycin and was discharged on the same. Her appetite has not gotten back to her baseline according to family, her diarrhea and abdominal pain were improved at the time of discharge. She also started having some cough here in the hospital at the time and was changed over to dysphagia diet. Patient hasn't had any episodes of aspiration but has been having some occasional dry cough. She finished her antibiotic no more than 2 weeks ago. For the last 3 days she's been complaining of intermittent abdominal pain especially after eating. Denies any nausea vomiting or hematemesis. She started having diarrhea again since yesterday and was brought to the emergency room. CT of the abdomen again shows diffuse pancolitis and C. difficile is tested positive. Pt is AOx2 at time of evaluation however she is disoriented to her current living situation at [facility]. Pt is disoriented to time and situation. Pt unable to answer questions regarding prior level of function or fall history. Pt history felt to be of poor reliability.     PT Comments    Pt refuses bed mobility, transfers, and ambulation at this time. Able to obtain more information from family who reports that pt has been unable to perform transfers since December. Pt is confused during session but willing to  participate with bed level exercises following encouragement from son and therapist. Pt able to complete all exercises as instructed but becomes increasingly lethargic repeatedly closing her eyes. Per son, pt needs forceful encouragement to participate. Pt will benefit from skilled PT services to address deficits in strength, balance, and mobility in order to return to full function at home.    Follow Up Recommendations  SNF (Return to [facility])     Equipment Recommendations  None recommended by PT    Recommendations for Other Services Rehab consult     Precautions / Restrictions Precautions Precautions: Fall Restrictions Weight Bearing Restrictions: No    Mobility  Bed Mobility               General bed mobility comments: Pt refuses to perform bed mobility  Transfers                 General transfer comment: Pt refuses to attempt transfers  Ambulation/Gait             General Gait Details: Refuses attempt   Stairs            Wheelchair Mobility    Modified Rankin (Stroke Patients Only)       Balance                                    Cognition Arousal/Alertness: Awake/alert Behavior During Therapy: Anxious Overall Cognitive Status: History of cognitive impairments - at baseline (History of basleline dementia, 75% command follow)  Memory: Decreased short-term memory (AOx2. Does not know that she has been at [facility])              Exercises General Exercises - Lower Extremity Ankle Circles/Pumps: Strengthening;Both;10 reps;Supine Quad Sets: Strengthening;Both;10 reps;Supine Short Arc Quad: Strengthening;Both;10 reps;Supine Heel Slides: Strengthening;Both;10 reps;Supine Hip ABduction/ADduction: Strengthening;Both;10 reps;Supine Straight Leg Raises: Strengthening;Both;10 reps;Supine    General Comments        Pertinent Vitals/Pain Pain Assessment: No/denies pain Faces Pain Scale: No hurt     Home Living                      Prior Function            PT Goals (current goals can now be found in the care plan section) Acute Rehab PT Goals Patient Stated Goal: Unable to participate in goal setting PT Goal Formulation: Patient unable to participate in goal setting Progress towards PT goals: Not progressing toward goals - comment (Pt unwilling to participate with transfers/ambulation)    Frequency  Min 2X/week    PT Plan Current plan remains appropriate    Co-evaluation             End of Session Equipment Utilized During Treatment: Gait belt Activity Tolerance: Other (comment) (Limited partially by confusion) Patient left: in bed;with call bell/phone within reach;with bed alarm set;with family/visitor present     Time: 1610-9604 PT Time Calculation (min) (ACUTE ONLY): 10 min  Charges:  $Therapeutic Exercise: 8-22 mins                    G Codes:      Sharalyn Ink Huprich PT, DPT   Huprich,Jason 10/03/2015, 11:46 AM

## 2015-10-03 NOTE — Progress Notes (Signed)
Notified Dr. Nemiah Commander patient had bloody stools yesterday and this morning.  Advised to discontinue aspirin and lovenox.

## 2015-10-03 NOTE — Progress Notes (Signed)
Patient had stools today with no evidence of red blood present.  Per Dr. Mechele Collin will continue to monitor hemoglobin and will transfuse if hemoglobin drops to parameters that require transfusion  And if bleeding continues and hemoglobin remains low then may consider colonscopy.

## 2015-10-03 NOTE — Progress Notes (Signed)
Per MD patient will not be ready for D/C over the weekend. Clinical Child psychotherapist (CSW) left a Engineer, technical sales for [employer] at [facility] making her aware of above. CSW will continue to follow and assist as needed.   Jetta Lout, LCSW [phone]

## 2015-10-03 NOTE — Consult Note (Signed)
Patient nurse and nurse aid discussed patient with me.  No BRB per rectum today, dark black/green stool today.  Hgb down from 10 to 8 this morning.  Patient asleep when I went to her bedside this afternoon, exam showed soft abd and few irreg heart beats.  Agree with stop Lovenox and asprin.  Would transfuse if below 7.5 due to age.  Dr. Servando Snare to be on this weekend but will not see unless you call about a problem.

## 2015-10-04 LAB — BASIC METABOLIC PANEL
Anion gap: 4 — ABNORMAL LOW (ref 5–15)
BUN: 19 mg/dL (ref 6–20)
CALCIUM: 7.4 mg/dL — AB (ref 8.9–10.3)
CO2: 29 mmol/L (ref 22–32)
Chloride: 111 mmol/L (ref 101–111)
Creatinine, Ser: 0.87 mg/dL (ref 0.44–1.00)
GFR calc Af Amer: >60 mL/min (ref >60)
GFR, EST NON AFRICAN AMERICAN: 55 mL/min — AB (ref >60)
GLUCOSE: 102 mg/dL — AB (ref 65–99)
Potassium: 3.9 mmol/L (ref 3.5–5.1)
Sodium: 144 mmol/L (ref 135–145)

## 2015-10-04 LAB — CBC
HEMATOCRIT: 23 % — AB (ref 35.0–47.0)
Hemoglobin: 7.4 g/dL — ABNORMAL LOW (ref 12.0–16.0)
MCH: 25.8 pg — AB (ref 26.0–34.0)
MCHC: 32 g/dL (ref 32.0–36.0)
MCV: 80.5 fL (ref 80.0–100.0)
PLATELETS: 285 10*3/uL (ref 150–440)
RBC: 2.86 MIL/uL — ABNORMAL LOW (ref 3.80–5.20)
RDW: 21.7 % — AB (ref 11.5–14.5)
WBC: 9.6 10*3/uL (ref 3.6–11.0)

## 2015-10-04 LAB — PREPARE RBC (CROSSMATCH)

## 2015-10-04 LAB — HEMOGLOBIN: Hemoglobin: 10.1 g/dL — ABNORMAL LOW (ref 12.0–16.0)

## 2015-10-04 MED ORDER — SODIUM CHLORIDE 0.9 % IV SOLN
Freq: Once | INTRAVENOUS | Status: AC
  Administered 2015-10-04: 11:00:00 via INTRAVENOUS

## 2015-10-04 NOTE — Progress Notes (Signed)
[facility] - [place] at [facility]   PATIENT NAME: Melissa Cooper    MR#:  [id number]  DATE OF BIRTH:  [birth year]  SUBJECTIVE:  CHIEF COMPLAINT:   Chief Complaint  Patient presents with  . Emesis   -No further bloody stolls since yesterday - steady drop of hemoglobin noted and now at 7.4- will tranfuse 1 unit today - patient denies any complaints  REVIEW OF SYSTEMS:  Review of Systems  Constitutional: Negative for fever and chills.  HENT: Positive for hearing loss. Negative for ear discharge, ear pain and nosebleeds.   Eyes: Negative for blurred vision and photophobia.  Respiratory: Negative for cough, shortness of breath and wheezing.   Cardiovascular: Negative for chest pain and palpitations.  Gastrointestinal: Positive for blood in stool. Negative for nausea, vomiting, abdominal pain, diarrhea and constipation.  Genitourinary: Negative for dysuria and frequency.  Musculoskeletal: Negative for myalgias.  Neurological: Positive for weakness. Negative for dizziness, sensory change, speech change, focal weakness, seizures and headaches.  Psychiatric/Behavioral: Positive for memory loss. Negative for depression.    DRUG ALLERGIES:   Allergies  Allergen Reactions  . Actonel [Risedronate Sodium] Other (See Comments)    Unknown reaction  . Ceftin [Cefuroxime Axetil] Other (See Comments)    Unknown reaction  . Evista [Raloxifene] Other (See Comments)    Unknown reaction  . Miacalcin [Calcitonin ([place])] Other (See Comments)    Unknown reaction  . Zyrtec [Cetirizine] Other (See Comments)    Unknown reaction    VITALS:  Blood pressure 112/90, pulse 111, temperature 98.4 F (36.9 C), temperature source Oral, resp. rate 17, height  (1.6 m), weight 62.596 kg (138 lb), SpO2 96 %.  PHYSICAL EXAMINATION:  Physical Exam  GENERAL: 82 y.o.-year-old elderly patient lying in the bed with no acute distress.  EYES: Pupils equal, round, reactive to  light and accommodation. No scleral icterus. Extraocular muscles intact.  HEENT: Head atraumatic, normocephalic. Oropharynx and nasopharynx clear.  NECK: Supple, no jugular venous distention. No thyroid enlargement, no tenderness.  LUNGS: Normal breath sounds bilaterally, no wheezing, rales,rhonchi or crepitation. No use of accessory muscles of respiration. Decreased bibasilar breath sounds. CARDIOVASCULAR: S1, S2 normal. No rubs, or gallops. 3/6 systolic murmur present. ABDOMEN: Soft, nontender, nondistended. Bowel sounds present. No organomegaly or mass.  EXTREMITIES: No pedal edema, cyanosis, or clubbing.  NEUROLOGIC: Cranial nerves II through XII are intact. Muscle strength 5/5 in all extremities. Sensation intact. Gait not checked. Following simple commands PSYCHIATRIC: The patient is alert and oriented x 2.  SKIN: No obvious rash, lesion, or ulcer.    LABORATORY PANEL:   CBC  Recent Labs Lab 10/04/15 0605  WBC 9.6  HGB 7.4*  HCT 23.0*  PLT 285   ------------------------------------------------------------------------------------------------------------------  Chemistries   Recent Labs Lab 09/28/15 0825  10/02/15 0250 10/04/15 0605  NA 139  < > 142 144  K 3.3*  < > 4.2 3.9  CL 101  < > 110 111  CO2 29  < > 27 29  GLUCOSE 123*  < > 127* 102*  BUN 28*  < > 16 19  CREATININE 0.96  < > 0.88 0.87  CALCIUM 7.8*  < > 7.3* 7.4*  MG  --   < > 2.2  --   AST 13*  --   --   --   ALT 7*  --   --   --   ALKPHOS 73  --   --   --   BILITOT 0.8  --   --   --   < > =  values in this interval not displayed. ------------------------------------------------------------------------------------------------------------------  Cardiac Enzymes No results for input(s): TROPONINI in the last 168 hours. ------------------------------------------------------------------------------------------------------------------  RADIOLOGY:  No results found.  EKG:   Orders placed or  performed during the hospital encounter of 08/29/15  . ED EKG  . ED EKG  . EKG 12-Lead  . EKG 12-Lead    ASSESSMENT AND PLAN:   Melissa Cooper is a 82 y.o. female with a known history of mild to moderate dementia, hypertension, hyperlipidemia, arthritis, chronic anemia and CKD stage III who was recently admitted to the hospital from 08/29/2015 to 09/05/2015 her C. difficile colitis and discharged to twin [facility] skilled nursing facility is brought back secondary to diarrhea and abdominal pain.  #1 Refractory C. difficile colitis- with bloody stools. Cannot rule out ischemic colitis. -CT of the abdomen showing pancolitis with inflammation which has progressed since last month. There is also evidence of duodenitis. -Appreciate GI and ID consults - on vanc orally and also flagyl- continue for 14 days total., -Avoid other antibiotics. on probiotics as well. -Had bloody stools likely from her colitis. Transfuse to keep hemoglobin greater than 7.5. Since there is no active bleeding, no need for GI bleeding scan at this time. -If continues to recur, then we will discuss with GI about colonoscopy prior to discharge  #2 Bibasilar atelectasis-noted on CT abdomen at lung bases. Will avoid antibiotics if she doesn't have to. Has dry cough with no increase in productive cough. -Just encourage incentive spirometry at this time. Closely monitor.  #3 Acute on chronic anemia-known history of pernicious and iron deficiency anemia. Continue her iron and B12 supplements -Received 2 units of transfusion this admission, will keep Hb >7.5. Due to bloody stools- dropped to 7.4- will transfuse 1unit and recheck tomorrow AM  #4 CKD stage III-stable. Continue to monitor  #5 DVT prophylaxis- discontinued due to bloody stools. On TEDs, SCDs  Patient is from [facility] [place] and will go back to [facility] [place] at discharge- likely Monday   All the records are reviewed and case discussed with Care Management/Social  Workerr. Management plans discussed with the patient, family and they are in agreement.  CODE STATUS: DNR  TOTAL TIME TAKING CARE OF THIS PATIENT: 38 minutes.   POSSIBLE D/C IN 2 DAYS, DEPENDING ON CLINICAL CONDITION.   Melissa Cooper M.D on 10/04/2015 at 9:44 AM  Between 7am to 6pm - Pager - [phone]  After 6pm go to [email or web address] - password [email or web address] [facility]  [place] [facility]  Office  [phone]  CC: Primary care physician; Tillman Abide, MD

## 2015-10-05 LAB — CBC
HEMATOCRIT: 30.2 % — AB (ref 35.0–47.0)
HEMOGLOBIN: 9.9 g/dL — AB (ref 12.0–16.0)
MCH: 26.9 pg (ref 26.0–34.0)
MCHC: 32.9 g/dL (ref 32.0–36.0)
MCV: 81.8 fL (ref 80.0–100.0)
Platelets: 242 10*3/uL (ref 150–440)
RBC: 3.69 MIL/uL — AB (ref 3.80–5.20)
RDW: 19.6 % — ABNORMAL HIGH (ref 11.5–14.5)
WBC: 8.3 10*3/uL (ref 3.6–11.0)

## 2015-10-05 LAB — BASIC METABOLIC PANEL
ANION GAP: 3 — AB (ref 5–15)
BUN: 17 mg/dL (ref 6–20)
CALCIUM: 7.5 mg/dL — AB (ref 8.9–10.3)
CHLORIDE: 110 mmol/L (ref 101–111)
CO2: 30 mmol/L (ref 22–32)
Creatinine, Ser: 0.84 mg/dL (ref 0.44–1.00)
GFR, EST NON AFRICAN AMERICAN: 57 mL/min — AB (ref >60)
Glucose, Bld: 100 mg/dL — ABNORMAL HIGH (ref 65–99)
POTASSIUM: 3.7 mmol/L (ref 3.5–5.1)
SODIUM: 143 mmol/L (ref 135–145)

## 2015-10-05 LAB — TYPE AND SCREEN
ABO/RH(D): O NEG
ANTIBODY SCREEN: NEGATIVE
UNIT DIVISION: 0

## 2015-10-05 NOTE — Progress Notes (Signed)
Patient complained of abdominal pain this shift.  No active bleeding present in stools.

## 2015-10-05 NOTE — Progress Notes (Signed)
[facility] Physicians - [place] at [facility]   PATIENT NAME: Melissa Cooper    MR#:  [id number]  DATE OF BIRTH:  1918/06/02  SUBJECTIVE:  CHIEF COMPLAINT:   Chief Complaint  Patient presents with  . Emesis   -No further bloody stools  - received 1 unit prbc Tx yesterday and hb now >9 and stable  REVIEW OF SYSTEMS:  Review of Systems  Constitutional: Negative for fever and chills.  HENT: Positive for hearing loss. Negative for ear discharge, ear pain and nosebleeds.   Eyes: Negative for blurred vision and photophobia.  Respiratory: Negative for cough, shortness of breath and wheezing.   Cardiovascular: Negative for chest pain and palpitations.  Gastrointestinal: Negative for nausea, vomiting, abdominal pain, diarrhea, constipation and blood in stool.  Genitourinary: Negative for dysuria and frequency.  Musculoskeletal: Negative for myalgias.  Neurological: Positive for weakness. Negative for dizziness, sensory change, speech change, focal weakness, seizures and headaches.  Psychiatric/Behavioral: Positive for memory loss. Negative for depression.    DRUG ALLERGIES:   Allergies  Allergen Reactions  . Actonel [Risedronate Sodium] Other (See Comments)    Unknown reaction  . Ceftin [Cefuroxime Axetil] Other (See Comments)    Unknown reaction  . Evista [Raloxifene] Other (See Comments)    Unknown reaction  . Miacalcin [Calcitonin (Salmon)] Other (See Comments)    Unknown reaction  . Zyrtec [Cetirizine] Other (See Comments)    Unknown reaction    VITALS:  Blood pressure 160/78, pulse 101, temperature 97.6 F (36.4 C), temperature source Oral, resp. rate 18, height  (1.6 m), weight 62.596 kg (138 lb), SpO2 98 %.  PHYSICAL EXAMINATION:  Physical Exam  GENERAL: 82 y.o.-year-old elderly patient lying in the bed with no acute distress.  EYES: Pupils equal, round, reactive to light and accommodation. No scleral icterus. Extraocular muscles intact.   HEENT: Head atraumatic, normocephalic. Oropharynx and nasopharynx clear.  NECK: Supple, no jugular venous distention. No thyroid enlargement, no tenderness.  LUNGS: Normal breath sounds bilaterally, no wheezing, rales,rhonchi or crepitation. No use of accessory muscles of respiration. Decreased bibasilar breath sounds. CARDIOVASCULAR: S1, S2 normal. No rubs, or gallops. 3/6 systolic murmur present. ABDOMEN: Soft, nontender, nondistended. Bowel sounds present. No organomegaly or mass.  EXTREMITIES: No pedal edema, cyanosis, or clubbing.  NEUROLOGIC: Cranial nerves II through XII are intact. Muscle strength 5/5 in all extremities. Sensation intact. Gait not checked. Following simple commands PSYCHIATRIC: The patient is alert and oriented x 2.  SKIN: No obvious rash, lesion, or ulcer.    LABORATORY PANEL:   CBC  Recent Labs Lab 10/05/15 0321  WBC 8.3  HGB 9.9*  HCT 30.2*  PLT 242   ------------------------------------------------------------------------------------------------------------------  Chemistries   Recent Labs Lab 09/28/15 0825  10/02/15 0250  10/05/15 0321  NA 139  < > 142  < > 143  K 3.3*  < > 4.2  < > 3.7  CL 101  < > 110  < > 110  CO2 29  < > 27  < > 30  GLUCOSE 123*  < > 127*  < > 100*  BUN 28*  < > 16  < > 17  CREATININE 0.96  < > 0.88  < > 0.84  CALCIUM 7.8*  < > 7.3*  < > 7.5*  MG  --   < > 2.2  --   --   AST 13*  --   --   --   --   ALT 7*  --   --   --   --  ALKPHOS 73  --   --   --   --   BILITOT 0.8  --   --   --   --   < > = values in this interval not displayed. ------------------------------------------------------------------------------------------------------------------  Cardiac Enzymes No results for input(s): TROPONINI in the last 168 hours. ------------------------------------------------------------------------------------------------------------------  RADIOLOGY:  No results found.  EKG:   Orders placed or performed  during the hospital encounter of 08/29/15  . ED EKG  . ED EKG  . EKG 12-Lead  . EKG 12-Lead    ASSESSMENT AND PLAN:   Melissa Cooper is a 82 y.o. female with a known history of mild to moderate dementia, hypertension, hyperlipidemia, arthritis, chronic anemia and CKD stage III who was recently admitted to the hospital from 08/29/2015 to 09/05/2015 her C. difficile colitis and discharged to [facility] skilled nursing facility is brought back secondary to diarrhea and abdominal pain.  #1 Refractory C. difficile colitis- with bloody stools. Cannot rule out ischemic colitis. -CT of the abdomen showing pancolitis with inflammation which has progressed since last month. There is also evidence of duodenitis. -Appreciate GI and ID consults - on vanc orally and also flagyl- continue for 14 days total., -Avoid other antibiotics. on probiotics as well. -Had bloody stools likely from her colitis. Improved after stopping aspirin and lovenox. -Transfuse to keep hemoglobin greater than 7.5. Since there is no active bleeding, no need for GI bleeding scan at this time. -If continues to recur, then we will discuss with GI about colonoscopy prior to discharge  #2 Bibasilar atelectasis-noted on CT abdomen at lung bases. Will avoid antibiotics if she doesn't have to. Has dry cough with no increase in productive cough. -Just encourage incentive spirometry at this time. Closely monitor.  #3 Acute on chronic anemia-known history of pernicious and iron deficiency anemia. Continue her iron and B12 supplements -Received 3 units of transfusion this admission, will keep Hb >7.5.   #4 CKD stage III-stable. Continue to monitor  #5 DVT prophylaxis- discontinued due to bloody stools. On TEDs, SCDs  Patient is from [facility] [place] and will go back to [facility] [place] at discharge- likely Monday   All the records are reviewed and case discussed with Care Management/Social Workerr. Management plans discussed with the patient,  family and they are in agreement.  CODE STATUS: DNR  TOTAL TIME TAKING CARE OF THIS PATIENT: 36 minutes.   POSSIBLE D/C TOMORROW, DEPENDING ON CLINICAL CONDITION.   Hester Joslin M.D on 10/05/2015 at 7:36 AM  Between 7am to 6pm - Pager - [phone]  After 6pm go to [email or web address] - password [email or web address] [facility]  [place] [facility]  Office  [phone]  CC: Primary care physician; Tillman Abide, MD

## 2015-10-06 LAB — CBC
HCT: 29.8 % — ABNORMAL LOW (ref 35.0–47.0)
HEMOGLOBIN: 9.5 g/dL — AB (ref 12.0–16.0)
MCH: 25.8 pg — AB (ref 26.0–34.0)
MCHC: 31.8 g/dL — ABNORMAL LOW (ref 32.0–36.0)
MCV: 81.1 fL (ref 80.0–100.0)
Platelets: 237 10*3/uL (ref 150–440)
RBC: 3.68 MIL/uL — AB (ref 3.80–5.20)
RDW: 20.4 % — ABNORMAL HIGH (ref 11.5–14.5)
WBC: 8.2 10*3/uL (ref 3.6–11.0)

## 2015-10-06 MED ORDER — TRAMADOL HCL 50 MG PO TABS: 50.0000 mg | ORAL_TABLET | Freq: Four times a day (QID) | ORAL | Status: DC | PRN

## 2015-10-06 MED ORDER — METOPROLOL TARTRATE 25 MG PO TABS: 25.0000 mg | ORAL_TABLET | Freq: Three times a day (TID) | ORAL | Status: DC

## 2015-10-06 MED ORDER — METOPROLOL TARTRATE 25 MG PO TABS
25.0000 mg | ORAL_TABLET | Freq: Two times a day (BID) | ORAL | Status: DC
  Administered 2015-10-06: 25 mg via ORAL
  Filled 2015-10-06: qty 1

## 2015-10-06 MED ORDER — METRONIDAZOLE 500 MG PO TABS: 500.0000 mg | ORAL_TABLET | Freq: Three times a day (TID) | ORAL | Status: AC

## 2015-10-06 MED ORDER — BENZONATATE 100 MG PO CAPS: 100.0000 mg | ORAL_CAPSULE | Freq: Three times a day (TID) | ORAL | Status: DC | PRN

## 2015-10-06 MED ORDER — VANCOMYCIN 50 MG/ML ORAL SOLUTION: 250.0000 mg | Freq: Four times a day (QID) | ORAL | Status: AC

## 2015-10-06 MED ORDER — PANTOPRAZOLE SODIUM 40 MG PO TBEC: 40.0000 mg | DELAYED_RELEASE_TABLET | Freq: Every day | ORAL | Status: DC

## 2015-10-06 NOTE — Care Management Important Message (Signed)
Important Message  Patient Details  Name: Melissa Cooper MRN: [id number] Date of Birth: 06-08-1918   Medicare Important Message Given:  Yes    Khole Branch A, RN 10/06/2015, 7:25 AM

## 2015-10-06 NOTE — Progress Notes (Signed)
Patient is medically stable for D/C back to [facility] today. Per Crystal admissions coordinator at [facility] patient is going to private room 328 for c-diff infection. RN will call report at [phone] and arrange EMS for transport. Clinical Child psychotherapist (CSW) sent D/C Summary, FL2, and D/C Packet to [employer]. Patient is aware of above. CSW left patient's son Baldo Ash a Engineer, technical sales. CSW also contacted patient's son Gerri Spore and made him aware of above. Please reconsult if future social work needs arise. CSW signing off.   Jetta Lout, LCSW [phone]

## 2015-10-06 NOTE — Discharge Summary (Signed)
[facility] Physicians - [place] at [facility]   PATIENT NAME: Melissa Cooper    MR#:  [id number]  DATE OF BIRTH:  [birth year]  DATE OF ADMISSION:  09/28/2015 ADMITTING PHYSICIAN: Enid Baas, MD  DATE OF DISCHARGE: 10/06/2015  PRIMARY CARE PHYSICIAN: Tillman Abide, MD    ADMISSION DIAGNOSIS:  Clostridium difficile colitis [A04.7] Healthcare-associated pneumonia [J18.9]  DISCHARGE DIAGNOSIS:  Active Problems:   Recurrent colitis due to Clostridium difficile   Protein-calorie malnutrition, severe   SECONDARY DIAGNOSIS:   Past Medical History  Diagnosis Date  . Hypercholesteremia   . HTN (hypertension)   . OSA (obstructive sleep apnea)   . PA (pernicious anemia)   . Osteoarthritis   . Dementia   . Osteoarthritis   . CKD (chronic kidney disease)     stage 3    HOSPITAL COURSE:    Melissa Cooper is a 82 y.o. female with a known history of mild to moderate dementia, hypertension, hyperlipidemia, arthritis, chronic anemia and CKD stage III who was recently admitted to the hospital from 08/29/2015 to 09/05/2015 her C. difficile colitis and discharged to [facility] skilled nursing facility is brought back secondary to diarrhea and abdominal pain.  #1 Refractory C. difficile colitis- with bloody stools. Cannot rule out ischemic colitis. -CT of the abdomen showing pancolitis with inflammation which has progressed since last month. There is also evidence of duodenitis. -Appreciate GI and ID consults - on vanc orally and also flagyl- continue for 14 days total., -Avoid other antibiotics. on probiotics as well. -Had bloody stools likely from her colitis. Improved after stopping aspirin and lovenox. -Transfuse to keep hemoglobin greater than 7.5. Since there is no active bleeding, no need for GI bleeding scan at this time. -No issues over the weekend. Hemoglobin has remained stable. follow-up with GI in 2 weeks. Maybe after completing treatment this time might  need a CT with oral contrast to evaluate the colitis. -Colonoscopy if everything else fails.  #2 Bibasilar atelectasis-noted on CT abdomen at lung bases. Will avoid antibiotics if she doesn't have to. Has dry cough with no increase in productive cough. -Just encourage incentive spirometry at this time. Closely monitor.  #3 Acute on chronic anemia-known history of pernicious and iron deficiency anemia. Continue her iron and B12 supplements -Received 3 units of transfusion this admission, will keep Hb >7.5.  -Hemoglobin is stable  #4 CKD stage III-stable. Continue to monitor  5 hypertension-started metoprolol.  Patient is stable and will be discharged to [facility] today.  DISCHARGE CONDITIONS:   Guarded  CONSULTS OBTAINED:  Treatment Team:  Clydie Braun, MD  DRUG ALLERGIES:   Allergies  Allergen Reactions  . Actonel [Risedronate Sodium] Other (See Comments)    Unknown reaction  . Ceftin [Cefuroxime Axetil] Other (See Comments)    Unknown reaction  . Evista [Raloxifene] Other (See Comments)    Unknown reaction  . Miacalcin [Calcitonin ([place])] Other (See Comments)    Unknown reaction  . Zyrtec [Cetirizine] Other (See Comments)    Unknown reaction    DISCHARGE MEDICATIONS:   Current Discharge Medication List    START taking these medications   Details  metoprolol tartrate (LOPRESSOR) 25 MG tablet Take 1 tablet (25 mg total) by mouth 3 (three) times daily. HOLD IF HR<60 or SBP<100 Qty: 90 tablet, Refills: 2    metroNIDAZOLE (FLAGYL) 500 MG tablet Take 1 tablet (500 mg total) by mouth every 8 (eight) hours. X 6 more days Qty: 18 tablet, Refills: 0  pantoprazole (PROTONIX) 40 MG tablet Take 1 tablet (40 mg total) by mouth daily. Qty: 30 tablet, Refills: 0    vancomycin (VANCOCIN) 50 mg/mL oral solution Take 5 mLs (250 mg total) by mouth every 6 (six) hours. X 6 more days Qty: 120 mL, Refills: 0      CONTINUE these medications which have CHANGED    Details  benzonatate (TESSALON) 100 MG capsule Take 1 capsule (100 mg total) by mouth 3 (three) times daily as needed for cough. Qty: 20 capsule, Refills: 0    traMADol (ULTRAM) 50 MG tablet Take 1 tablet (50 mg total) by mouth every 6 (six) hours as needed for moderate pain or severe pain (As needed for Mod-severe pain). Qty: 20 tablet, Refills: 0      CONTINUE these medications which have NOT CHANGED   Details  acetaminophen (TYLENOL ARTHRITIS PAIN) 650 MG CR tablet Take 650 mg by mouth 3 (three) times daily.    feeding supplement, ENSURE ENLIVE, (ENSURE ENLIVE) LIQD Take 237 mLs by mouth 2 (two) times daily between meals. Qty: 237 mL, Refills: 12    ferrous fumarate (HEMOCYTE - 106 MG FE) 325 (106 Fe) MG TABS tablet Take 325 mg of iron by mouth daily.     ondansetron (ZOFRAN) 4 MG tablet Take 4 mg by mouth 3 (three) times daily as needed for nausea or vomiting.    vitamin B-12 1000 MCG tablet Take 1 tablet (1,000 mcg total) by mouth daily. Qty: 30 tablet, Refills: 0    acidophilus (RISAQUAD) CAPS capsule Take 1 capsule by mouth 2 (two) times daily. Qty: 60 capsule, Refills: 3      STOP taking these medications     aspirin 81 MG chewable tablet      guaiFENesin-codeine 100-10 MG/5ML syrup          DISCHARGE INSTRUCTIONS:   1. PCP f/u in 1-2 weeks 2. GI f/u in 2 weeks Might need a repeat CT adbomen after finishing this course of IV ABX  If you experience worsening of your admission symptoms, develop shortness of breath, life threatening emergency, suicidal or homicidal thoughts you must seek medical attention immediately by calling 911 or calling your MD immediately  if symptoms less severe.  You Must read complete instructions/literature along with all the possible adverse reactions/side effects for all the Medicines you take and that have been prescribed to you. Take any new Medicines after you have completely understood and accept all the possible adverse  reactions/side effects.   Please note  You were cared for by a hospitalist during your hospital stay. If you have any questions about your discharge medications or the care you received while you were in the hospital after you are discharged, you can call the unit and asked to speak with the hospitalist on call if the hospitalist that took care of you is not available. Once you are discharged, your primary care physician will handle any further medical issues. Please note that NO REFILLS for any discharge medications will be authorized once you are discharged, as it is imperative that you return to your primary care physician (or establish a relationship with a primary care physician if you do not have one) for your aftercare needs so that they can reassess your need for medications and monitor your lab values.    Today   CHIEF COMPLAINT:   Chief Complaint  Patient presents with  . Emesis     VITAL SIGNS:  Blood pressure 120/54, pulse 116, temperature  97.6 F (36.4 C), temperature source Oral, resp. rate 18, height 5\' 3"  (1.6 m), weight 60.328 kg (133 lb), SpO2 97 %.  I/O:   Intake/Output Summary (Last 24 hours) at 10/06/15 0828 Last data filed at 10/05/15 2032  Gross per 24 hour  Intake    240 ml  Output      0 ml  Net    240 ml    PHYSICAL EXAMINATION:   Physical Exam  GENERAL: 82 y.o.-year-old elderly patient lying in the bed with no acute distress.  EYES: Pupils equal, round, reactive to light and accommodation. No scleral icterus. Extraocular muscles intact.  HEENT: Head atraumatic, normocephalic. Oropharynx and nasopharynx clear.  NECK: Supple, no jugular venous distention. No thyroid enlargement, no tenderness.  LUNGS: Normal breath sounds bilaterally, no wheezing, rales,rhonchi or crepitation. No use of accessory muscles of respiration. Decreased bibasilar breath sounds. CARDIOVASCULAR: S1, S2 normal. No rubs, or gallops. 3/6 systolic murmur present. ABDOMEN:  Soft, nontender, nondistended. Bowel sounds present. No organomegaly or mass.  EXTREMITIES: No pedal edema, cyanosis, or clubbing.  NEUROLOGIC: Cranial nerves II through XII are intact. Muscle strength 5/5 in all extremities. Sensation intact. Gait not checked. Following simple commands PSYCHIATRIC: The patient is alert and oriented x 2.  SKIN: No obvious rash, lesion, or ulcer.   DATA REVIEW:   CBC  Recent Labs Lab 10/06/15 0322  WBC 8.2  HGB 9.5*  HCT 29.8*  PLT 237    Chemistries   Recent Labs Lab 10/02/15 0250  10/05/15 0321  NA 142  < > 143  K 4.2  < > 3.7  CL 110  < > 110  CO2 27  < > 30  GLUCOSE 127*  < > 100*  BUN 16  < > 17  CREATININE 0.88  < > 0.84  CALCIUM 7.3*  < > 7.5*  MG 2.2  --   --   < > = values in this interval not displayed.  Cardiac Enzymes No results for input(s): TROPONINI in the last 168 hours.  Microbiology Results  Results for orders placed or performed during the hospital encounter of 09/28/15  C difficile quick scan w PCR reflex     Status: Abnormal   Collection Time: 09/28/15  9:32 AM  Result Value Ref Range Status   C Diff antigen POSITIVE (A) NEGATIVE Final   C Diff toxin POSITIVE (A) NEGATIVE Final   C Diff interpretation   Final    Positive for toxigenic C. difficile, active toxin production present.    Comment: CRITICAL RESULT CALLED TO, READ BACK BY AND VERIFIED WITH: AMBER JONES 09/28/15 AT 1114 BY HS   Blood culture (routine x 2)     Status: None   Collection Time: 09/28/15  9:52 AM  Result Value Ref Range Status   Specimen Description BLOOD LEFT HAND  Final   Special Requests BOTTLES DRAWN AEROBIC AND ANAEROBIC  4CC  Final   Culture NO GROWTH 5 DAYS  Final   Report Status 10/03/2015 FINAL  Final  Blood culture (routine x 2)     Status: None   Collection Time: 09/28/15 10:05 AM  Result Value Ref Range Status   Specimen Description BLOOD LEFT ANTECUBITAL  Final   Special Requests BOTTLES DRAWN AEROBIC AND ANAEROBIC   5CC  Final   Culture NO GROWTH 5 DAYS  Final   Report Status 10/03/2015 FINAL  Final    RADIOLOGY:  No results found.  EKG:   Orders placed or performed  during the hospital encounter of 08/29/15  . ED EKG  . ED EKG  . EKG 12-Lead  . EKG 12-Lead      Management plans discussed with the patient, family and they are in agreement.  CODE STATUS:     Code Status Orders        Start     Ordered   09/28/15 1323  Do not attempt resuscitation (DNR)   Continuous    Question Answer Comment  In the event of cardiac or respiratory ARREST Do not call a "code blue"   In the event of cardiac or respiratory ARREST Do not perform Intubation, CPR, defibrillation or ACLS   In the event of cardiac or respiratory ARREST Use medication by any route, position, wound care, and other measures to relive pain and suffering. May use oxygen, suction and manual treatment of airway obstruction as needed for comfort.      09/28/15 1322    Code Status History    Date Active Date Inactive Code Status Order ID Comments User Context   08/29/2015  6:35 PM 09/05/2015  6:35 PM DNR [id number]  Enid Baas, MD Inpatient    Advance Directive Documentation        Most Recent Value   Type of Advance Directive  Out of facility DNR (pink MOST or yellow form)   Pre-existing out of facility DNR order (yellow form or pink MOST form)     "MOST" Form in Place?        TOTAL TIME TAKING CARE OF THIS PATIENT: 37 minutes.    Enid Baas M.D on 10/06/2015 at 8:28 AM  Between 7am to 6pm - Pager - [phone]  After 6pm go to [email or web address] - password [email or web address] [facility]  [place] [facility]  Office  [phone]  CC: Primary care physician; Tillman Abide, MD

## 2015-10-06 NOTE — Progress Notes (Addendum)
Called report to [facility] spoke with Elnita Maxwell nurse receiving Marcelino Duster.  Called for nonemergent transportation to transfer to [facility].

## 2015-10-07 DIAGNOSIS — E43 Unspecified severe protein-calorie malnutrition: Secondary | ICD-10-CM | POA: Diagnosis not present

## 2015-10-07 DIAGNOSIS — A047 Enterocolitis due to Clostridium difficile: Secondary | ICD-10-CM

## 2015-11-05 DIAGNOSIS — N183 Chronic kidney disease, stage 3 (moderate): Secondary | ICD-10-CM

## 2015-11-05 DIAGNOSIS — K529 Noninfective gastroenteritis and colitis, unspecified: Secondary | ICD-10-CM

## 2015-11-05 DIAGNOSIS — I10 Essential (primary) hypertension: Secondary | ICD-10-CM

## 2015-11-05 DIAGNOSIS — G4733 Obstructive sleep apnea (adult) (pediatric): Secondary | ICD-10-CM

## 2015-11-05 DIAGNOSIS — D631 Anemia in chronic kidney disease: Secondary | ICD-10-CM | POA: Diagnosis not present

## 2015-11-05 DIAGNOSIS — M1990 Unspecified osteoarthritis, unspecified site: Secondary | ICD-10-CM

## 2015-11-05 DIAGNOSIS — F0150 Vascular dementia without behavioral disturbance: Secondary | ICD-10-CM

## 2015-11-14 ENCOUNTER — Telehealth: Payer: Self-pay | Admitting: Family Medicine

## 2015-11-14 ENCOUNTER — Other Ambulatory Visit
Admission: RE | Admit: 2015-11-14 | Discharge: 2015-11-14 | Disposition: A | Discharge status: Home / Self Care | Payer: Medicare Other | Source: Ambulatory Visit | Attending: Internal Medicine | Admitting: Internal Medicine

## 2015-11-14 DIAGNOSIS — K529 Noninfective gastroenteritis and colitis, unspecified: Secondary | ICD-10-CM | POA: Insufficient documentation

## 2015-11-14 DIAGNOSIS — A047 Enterocolitis due to Clostridium difficile: Secondary | ICD-10-CM | POA: Diagnosis present

## 2015-11-14 LAB — GASTROINTESTINAL PANEL BY PCR, STOOL (REPLACES STOOL CULTURE)
ASTROVIRUS: NOT DETECTED
Adenovirus F40/41: NOT DETECTED
Campylobacter species: NOT DETECTED
Cryptosporidium: NOT DETECTED
Cyclospora cayetanensis: NOT DETECTED
E. COLI O157: NOT DETECTED
ENTAMOEBA HISTOLYTICA: NOT DETECTED
ENTEROTOXIGENIC E COLI (ETEC): NOT DETECTED
Enteroaggregative E coli (EAEC): NOT DETECTED
Enteropathogenic E coli (EPEC): NOT DETECTED
Giardia lamblia: NOT DETECTED
NOROVIRUS GI/GII: NOT DETECTED
PLESIMONAS SHIGELLOIDES: NOT DETECTED
ROTAVIRUS A: NOT DETECTED
SALMONELLA SPECIES: NOT DETECTED
SAPOVIRUS (I, II, IV, AND V): NOT DETECTED
SHIGA LIKE TOXIN PRODUCING E COLI (STEC): NOT DETECTED
SHIGELLA/ENTEROINVASIVE E COLI (EIEC): NOT DETECTED
Vibrio cholerae: NOT DETECTED
Vibrio species: NOT DETECTED
Yersinia enterocolitica: NOT DETECTED

## 2015-11-14 LAB — C DIFFICILE QUICK SCREEN W PCR REFLEX
C DIFFICILE (CDIFF) INTERP: POSITIVE
C DIFFICILE (CDIFF) TOXIN: POSITIVE — AB
C Diff antigen: POSITIVE — AB

## 2015-11-14 NOTE — Telephone Encounter (Signed)
Received a call from [facility] stating pt stool sample is positive for C diff. I reviewed pt's chart- h/o hospitalization for refractory c diff colitis, most recently last month. I attempted to contact at number listed on chart- "not a working number." I called Rosey Batheresa back with [facility] to let her know and she said lab did not give her contact information for patient either. Unclear if she is currently a resident at a SNF.  I called [facility] as this was in her chart from January and spoke with a nurse, Lupita LeashDonna, who states that she is indeed a resident there and she was started on oral vancomycin yesterday morning.  Will route to Dr. Alphonsus SiasLetvak as an Lorain ChildesFYI.

## 2015-11-15 NOTE — Telephone Encounter (Signed)
Not sure why that was done Recently on dificid--- but still could have positive tests even without diarrhea Probably ordered by the GI [facility] that has been managing this

## 2015-11-17 NOTE — Telephone Encounter (Signed)
This was done by GI Has been on multiple rounds of Rx--including dificid--- not sure of significance of positive value under those circumstances

## 2015-11-17 NOTE — Telephone Encounter (Signed)
PLEASE NOTE: All timestamps contained within this report are represented as [place] Standard Time. CONFIDENTIALTY NOTICE: This fax transmission is intended only for the addressee. It contains information that is legally privileged, confidential or otherwise protected from use or disclosure. If you are not the intended recipient, you are strictly prohibited from reviewing, disclosing, copying using or disseminating any of this information or taking any action in reliance on or regarding this information. If you have received this fax in error, please notify us immediately by telephone so that we can arrange for its return to us. Phone: [phone], Toll-Free: [phone], Fax: [phone] Page: 1 of 2 Call Id: [id number] [facility] TELEPHONE ADVICE RECORD [facility] Patient Name: Melissa Cooper Gender: Female DOB: 11/26/1917 Age: 82 Y 3 M 9 D Return Phone Number: Address: City/State/Zip: [place] StatisticianClient [facility] Physician Tillman AbideLetvak, Richard Contact Type Call Who Is Calling Lab Lab Name [facility] Lab Phone Number [phone] Lab Tech Name [facility] Reference Number N/A Chief Complaint Lab Result (Critical or Stat) Call Type Lab Send to RN Reason for Call Report lab results Initial Comment Caller states she is [facility] [phone] with a critical result. Additional Comment Caller needs to give nurse the critical result for a patient. Translation No Nurse Assessment Nurse: Elroy ChannelIrvin, RN, Rosey Batheresa Date/Time Lamount Cohen(Eastern Time): 11/14/2015 5:53:06 PM Is there an on-call provider listed? ---Yes Please list name of person reporting value (Lab Employee) and a contact number. ---Regis Billhea from [facility] [phone] Please document the following items: Lab name Lab value (read back  to lab to verify) Reference range for lab value Date and time blood was drawn Collect time of birth for bilirubin results ---C- diff antigen and toxin was positive Collected 11/14/15 @ 2:00 Please collect the patient contact information from the lab. (name, phone number and address) ---None available Guidelines Guideline Title Affirmed Question Affirmed Notes Nurse Date/Time (Eastern Time) Disp. Time Lamount Cohen(Eastern Time) Disposition Final User 11/14/2015 5:47:44 PM [facility] Irvin, [place]RN, Rosey Batheresa 11/14/2015 5:48:05 PM Paged On Call back to Call Center Elroy ChannelIrvin, RN, Rosey Batheresa Reason: text sent 11/14/2015 5:59:20 PM Clinical Call Yes Elroy ChannelIrvin, RN, Rosey Batheresa PLEASE NOTE: All timestamps contained within this report are represented as [place] Standard Time. CONFIDENTIALTY NOTICE: This fax transmission is intended only for the addressee. It contains information that is legally privileged, confidential or otherwise protected from use or disclosure. If you are not the intended recipient, you are strictly prohibited from reviewing, disclosing, copying using or disseminating any of this information or taking any action in reliance on or regarding this information. If you have received this fax in error, please notify us immediately by telephone so that we can arrange for its return to us. Phone: [phone], Toll-Free: [phone], Fax: [phone] Page: 2 of 2 Call Id: [id number] Paging DoctorName Phone DateTime Result/Outcome Message Type Notes Ruthe Mannanron, Talia [phone] 11/14/2015 5:47:44 PM Paged On Call Back to Call Center Doctor Paged Received call from [facility] of positive c-diff antigen and toxin. This is Lowella Dandyeresa [facility] [phone]. Ruthe Mannanron, Talia 11/14/2015 5:59:04 PM Spoke with On Call - General Message Result Gave report to MD. MD will contact patient

## 2016-01-07 IMAGING — CT CT CHEST W/O CM
1 series · 15 of 31 positions shown, 19 images · non-contrast
Comparison: 09/02/2015.

CLINICAL DATA: Followup to current chest radiograph. Chest
radiograph showed right hilar fullness.

EXAM:
CT CHEST WITHOUT CONTRAST
TECHNIQUE: Multidetector CT imaging of the chest was performed following the
standard protocol without IV contrast.

[Series 2: routine chest wo · axial · 0.58mm/px · z∈[-280,-30]mm · 15 of 54 slices shown, 19 images]
[im 2/54  mediastinal]
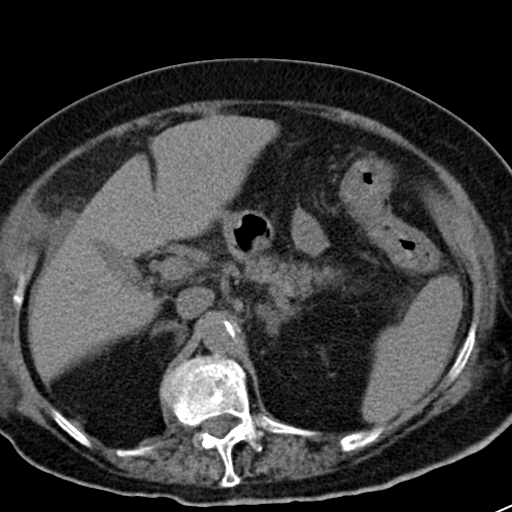
[im 2/54  lung]
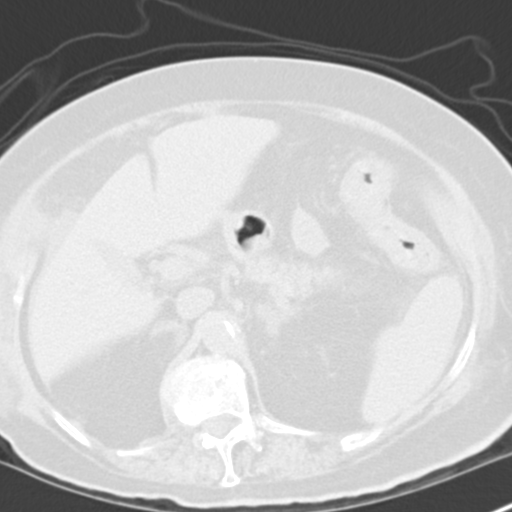
[im 6/54  lung]
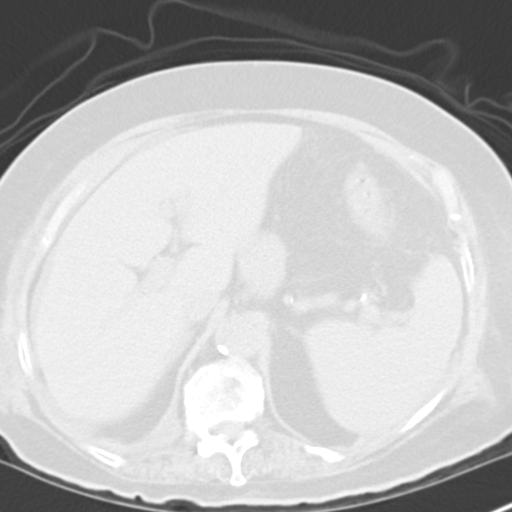
[im 10/54  lung]
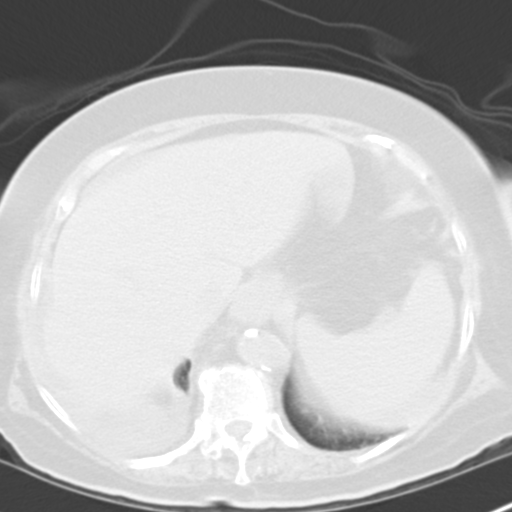
[im 12/54  lung]
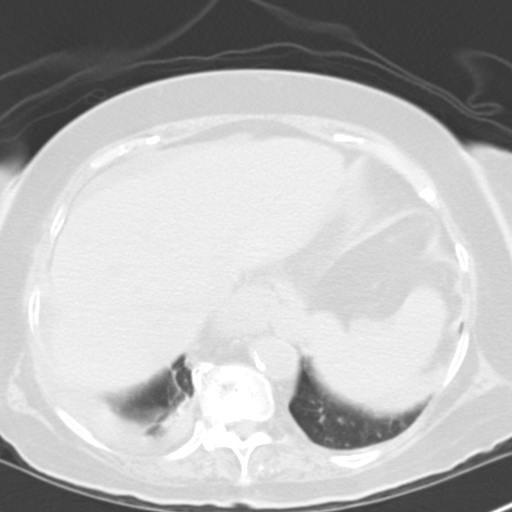
[im 16/54  mediastinal]
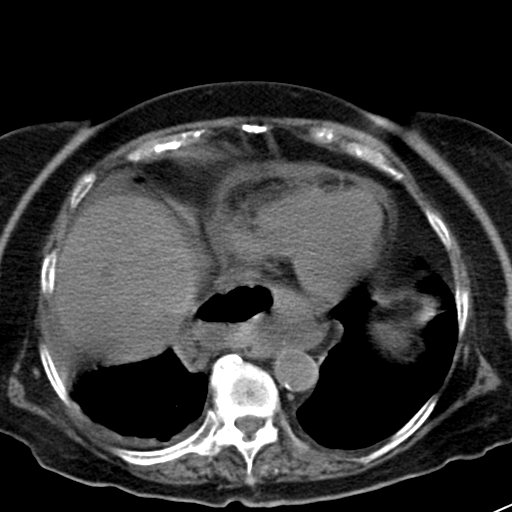
[im 16/54  lung]
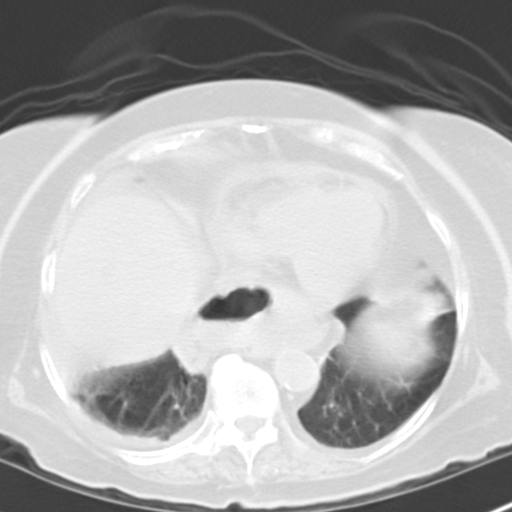
[im 20/54  lung]
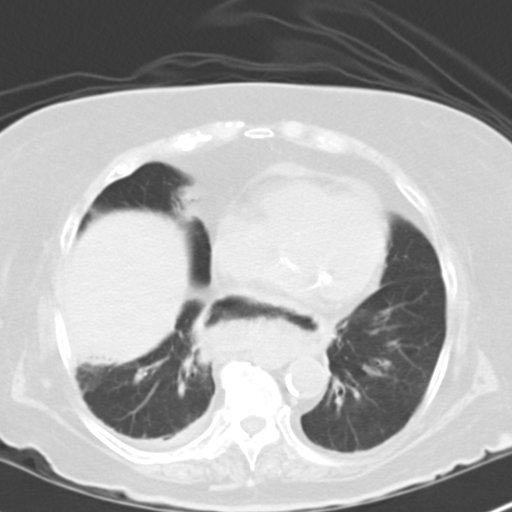
[im 24/54  lung]
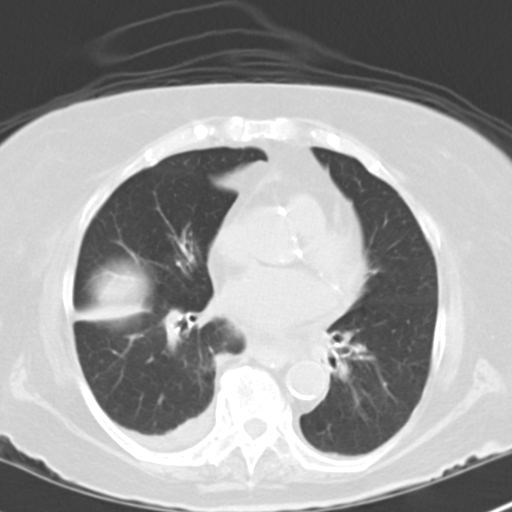
[im 28/54  lung]
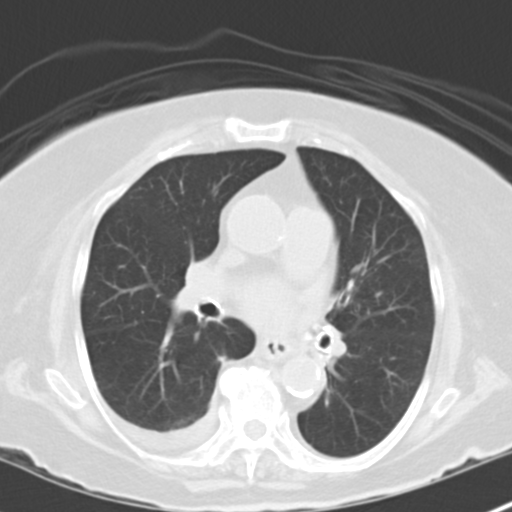
[im 30/54  mediastinal]
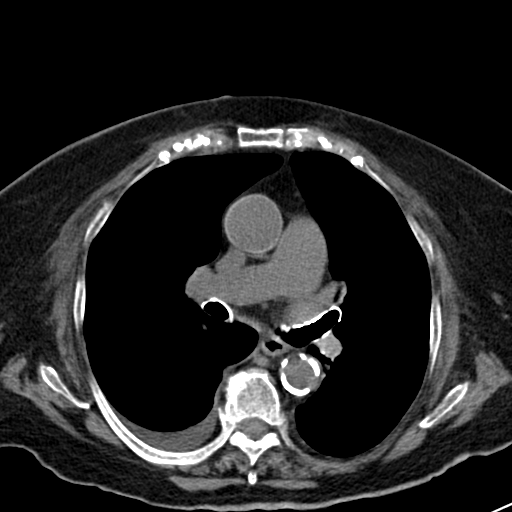
[im 30/54  lung]
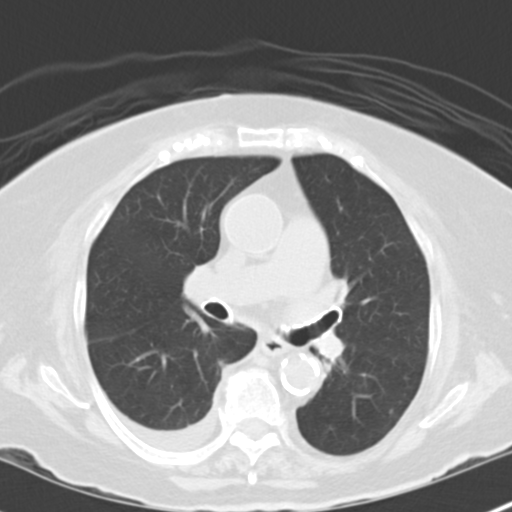
[im 34/54  lung]
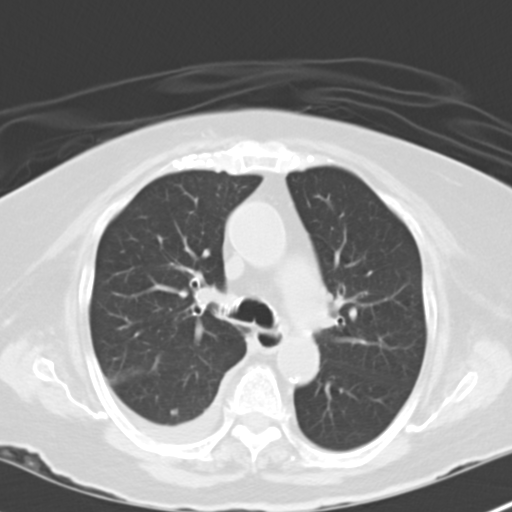
[im 38/54  lung]
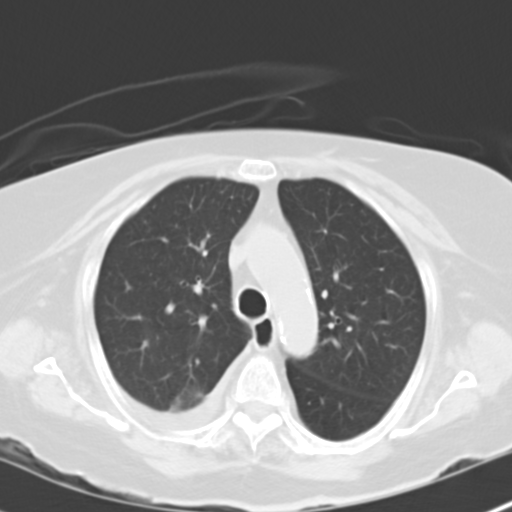
[im 42/54  lung]
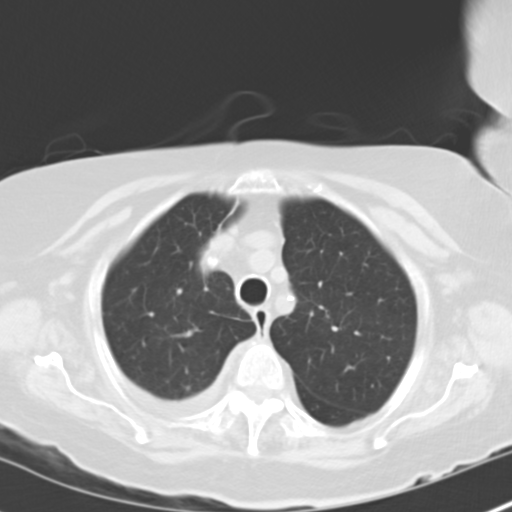
[im 44/54  mediastinal]
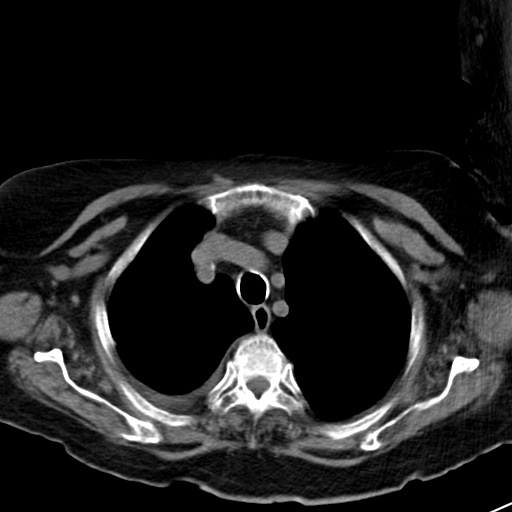
[im 44/54  lung]
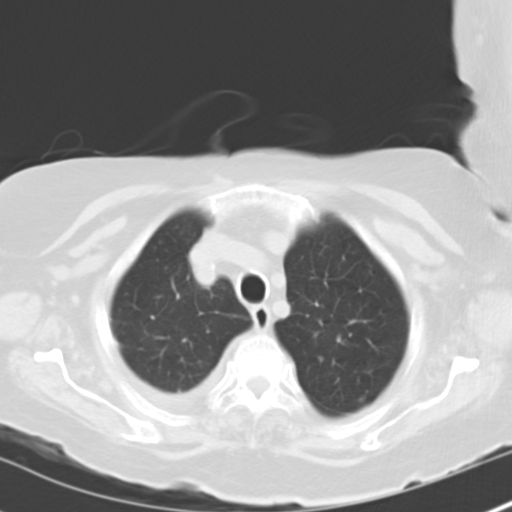
[im 48/54  lung]
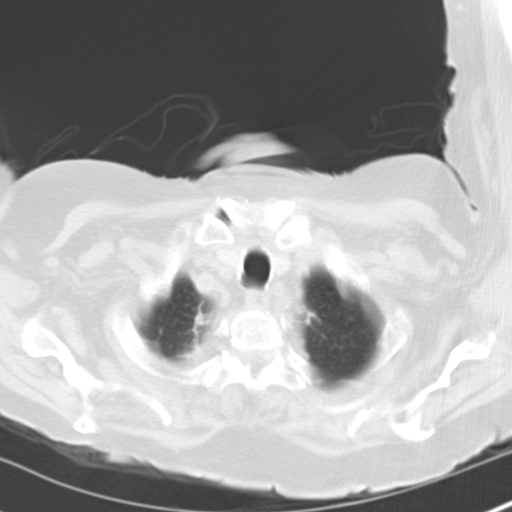
[im 52/54  lung]
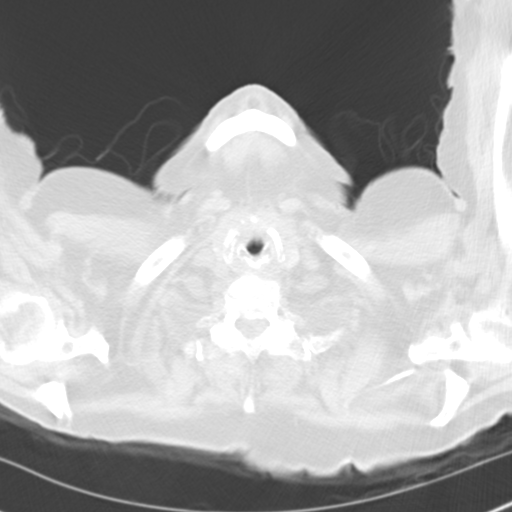

[15 of 31 positions shown; findings below may reference images not displayed]

FINDINGS: Neck base and axilla: No mass or adenopathy. Thyroid is
unremarkable.

Mediastinum and hila: Heart normal in size and configuration. There
are moderate coronary artery calcifications. Trace pericardial fluid
is noted. Moderate size hiatal hernia. No mediastinal or hilar
masses or enlarged lymph nodes.

Lungs and pleura: Small right pleural effusion. There is bronchial
wall thickening and adjacent linear and reticular opacity most
evident in the lower lobes, right greater than left, and base of the
right middle lobe and left upper lobe lingula, consistent with a
combination of chronic bronchial wall thickening and associated
subsegmental atelectasis no evidence of pneumonia or pulmonary
edema. There is a 4 mm nodule in the right lower lobe, superior
segment. Mild scarring is noted at the apices.

Limited upper abdomen: Small amount of ascites. This has developed
since the abdomen and pelvis CT dated 08/29/2015.

Musculoskeletal: Diffuse bony demineralization. Significant
degenerative changes are noted throughout the thoracic spine. Old
healed mid sternal fracture. No osteoblastic or osteolytic lesions.
IMPRESSION: 1. The fullness of the right hilum noted on the prior chest
radiograph was due to prominence of the right hilar vasculature
accentuated by low lung volumes. There is no mediastinal or hilar
mass or adenopathy.
2. Small right pleural effusion. Mild dependent subsegmental
atelectasis. No evidence of pneumonia or pulmonary edema.
3. Small amount ascites has developed since the prior abdomen and
pelvis CT.
4. 4 mm right lower lobe nodule. If the patient is at high risk for
bronchogenic carcinoma, follow-up chest CT at 1 year is recommended.
If the patient is at low risk, no follow-up is needed. This
recommendation follows the consensus statement: Guidelines for
Management of Small Pulmonary Nodules Detected on CT Scans: A
Statement from the [HOSPITAL] as published in Radiology

## 2016-01-27 DIAGNOSIS — M150 Primary generalized (osteo)arthritis: Secondary | ICD-10-CM | POA: Diagnosis not present

## 2016-01-27 DIAGNOSIS — D631 Anemia in chronic kidney disease: Secondary | ICD-10-CM | POA: Diagnosis not present

## 2016-01-27 DIAGNOSIS — F0150 Vascular dementia without behavioral disturbance: Secondary | ICD-10-CM

## 2016-01-27 DIAGNOSIS — I10 Essential (primary) hypertension: Secondary | ICD-10-CM | POA: Diagnosis not present

## 2016-02-09 ENCOUNTER — Telehealth: Payer: Self-pay | Admitting: *Deleted

## 2016-02-09 NOTE — Telephone Encounter (Signed)
This has been handled--we have standing orders for this

## 2016-02-09 NOTE — Telephone Encounter (Signed)
[facility] Nonclinical Telephone Record [facility] Client [facility] - [facility] Tillman AbideLetvak, Richard - MD Contact Type Call Who Is Calling Physician / Provider / Hospital Call Type Provider [facility] Reason for Call Request to speak to Physician Initial Comment Judeth CornfieldStephanie @ [facility]: She needs to speak to the doctor on call about a patient who has a skin tear. She needs advice on what to do. Additional Comment Patient Name Melissa Cooper Patient DOB 12.8.1919 [facility] Phys[facility] Number [phone] [facility] Paging [facility] Phone DateTime Result/Outcome Message Type Notes Kerby NoraBedsole, Amy - MD [phone] 02/08/2016 10:00:55 AM Paged On Call to Other Provider Doctor Paged [facility]: She needs to speak to the doctor on call about a patient who has a skin tear. She needs advice on what to do. cb# [phone] Kerby NoraBedsole, Amy - MD 02/08/2016 10:01:04 AM Paged On Call to Another Provider Message Result Call Closed By: Rogue BussingJasmine Thibou Transaction Date/Time: 02/08/2016 9:51:57 AM (ET)

## 2016-03-17 DIAGNOSIS — D631 Anemia in chronic kidney disease: Secondary | ICD-10-CM

## 2016-03-17 DIAGNOSIS — I10 Essential (primary) hypertension: Secondary | ICD-10-CM

## 2016-03-17 DIAGNOSIS — M1990 Unspecified osteoarthritis, unspecified site: Secondary | ICD-10-CM

## 2016-03-17 DIAGNOSIS — K219 Gastro-esophageal reflux disease without esophagitis: Secondary | ICD-10-CM

## 2016-03-17 DIAGNOSIS — N183 Chronic kidney disease, stage 3 (moderate): Secondary | ICD-10-CM

## 2016-03-17 DIAGNOSIS — F0150 Vascular dementia without behavioral disturbance: Secondary | ICD-10-CM

## 2016-05-13 DIAGNOSIS — M159 Polyosteoarthritis, unspecified: Secondary | ICD-10-CM

## 2016-05-13 DIAGNOSIS — D613 Idiopathic aplastic anemia: Secondary | ICD-10-CM

## 2016-05-13 DIAGNOSIS — K219 Gastro-esophageal reflux disease without esophagitis: Secondary | ICD-10-CM

## 2016-05-13 DIAGNOSIS — I10 Essential (primary) hypertension: Secondary | ICD-10-CM

## 2016-05-13 DIAGNOSIS — F0150 Vascular dementia without behavioral disturbance: Secondary | ICD-10-CM

## 2016-06-12 ENCOUNTER — Inpatient Hospital Stay: Payer: Medicare Other | Admitting: Anesthesiology

## 2016-06-12 ENCOUNTER — Inpatient Hospital Stay: Payer: Medicare Other

## 2016-06-12 ENCOUNTER — Encounter
Admission: EM | Disposition: A | Discharge status: Skilled Nursing Facility | Payer: Self-pay | Source: Home / Self Care | Attending: Internal Medicine

## 2016-06-12 ENCOUNTER — Emergency Department: Payer: Medicare Other

## 2016-06-12 ENCOUNTER — Inpatient Hospital Stay
Admission: EM | Admit: 2016-06-12 | Discharge: 2016-06-15 | DRG: 481 | Disposition: A | Discharge status: Skilled Nursing Facility | Payer: Medicare Other | Attending: Internal Medicine | Admitting: Internal Medicine

## 2016-06-12 DIAGNOSIS — S72009A Fracture of unspecified part of neck of unspecified femur, initial encounter for closed fracture: Secondary | ICD-10-CM | POA: Diagnosis present

## 2016-06-12 DIAGNOSIS — N183 Chronic kidney disease, stage 3 (moderate): Secondary | ICD-10-CM | POA: Diagnosis present

## 2016-06-12 DIAGNOSIS — Z66 Do not resuscitate: Secondary | ICD-10-CM | POA: Diagnosis present

## 2016-06-12 DIAGNOSIS — I129 Hypertensive chronic kidney disease with stage 1 through stage 4 chronic kidney disease, or unspecified chronic kidney disease: Secondary | ICD-10-CM | POA: Diagnosis present

## 2016-06-12 DIAGNOSIS — Z22322 Carrier or suspected carrier of Methicillin resistant Staphylococcus aureus: Secondary | ICD-10-CM

## 2016-06-12 DIAGNOSIS — Z79899 Other long term (current) drug therapy: Secondary | ICD-10-CM | POA: Diagnosis not present

## 2016-06-12 DIAGNOSIS — R4182 Altered mental status, unspecified: Secondary | ICD-10-CM

## 2016-06-12 DIAGNOSIS — D62 Acute posthemorrhagic anemia: Secondary | ICD-10-CM | POA: Diagnosis not present

## 2016-06-12 DIAGNOSIS — F0150 Vascular dementia without behavioral disturbance: Secondary | ICD-10-CM | POA: Diagnosis present

## 2016-06-12 DIAGNOSIS — Z888 Allergy status to other drugs, medicaments and biological substances status: Secondary | ICD-10-CM | POA: Diagnosis not present

## 2016-06-12 DIAGNOSIS — S72002A Fracture of unspecified part of neck of left femur, initial encounter for closed fracture: Secondary | ICD-10-CM

## 2016-06-12 DIAGNOSIS — D500 Iron deficiency anemia secondary to blood loss (chronic): Secondary | ICD-10-CM | POA: Diagnosis present

## 2016-06-12 DIAGNOSIS — E7800 Pure hypercholesterolemia, unspecified: Secondary | ICD-10-CM | POA: Diagnosis present

## 2016-06-12 DIAGNOSIS — S72142A Displaced intertrochanteric fracture of left femur, initial encounter for closed fracture: Principal | ICD-10-CM | POA: Diagnosis present

## 2016-06-12 DIAGNOSIS — Z7401 Bed confinement status: Secondary | ICD-10-CM

## 2016-06-12 DIAGNOSIS — W010XXA Fall on same level from slipping, tripping and stumbling without subsequent striking against object, initial encounter: Secondary | ICD-10-CM | POA: Diagnosis present

## 2016-06-12 DIAGNOSIS — G4733 Obstructive sleep apnea (adult) (pediatric): Secondary | ICD-10-CM | POA: Diagnosis present

## 2016-06-12 HISTORY — PX: INTRAMEDULLARY (IM) NAIL INTERTROCHANTERIC: SHX5875

## 2016-06-12 LAB — COMPREHENSIVE METABOLIC PANEL
ALK PHOS: 78 U/L (ref 38–126)
ALT: 9 U/L — AB (ref 14–54)
AST: 19 U/L (ref 15–41)
Albumin: 4.1 g/dL (ref 3.5–5.0)
Anion gap: 7 (ref 5–15)
BUN: 37 mg/dL — AB (ref 6–20)
CALCIUM: 9.4 mg/dL (ref 8.9–10.3)
CO2: 25 mmol/L (ref 22–32)
CREATININE: 1.22 mg/dL — AB (ref 0.44–1.00)
Chloride: 108 mmol/L (ref 101–111)
GFR, EST AFRICAN AMERICAN: 42 mL/min — AB (ref >60)
GFR, EST NON AFRICAN AMERICAN: 36 mL/min — AB (ref >60)
Glucose, Bld: 105 mg/dL — ABNORMAL HIGH (ref 65–99)
Potassium: 4.3 mmol/L (ref 3.5–5.1)
Sodium: 140 mmol/L (ref 135–145)
Total Bilirubin: 0.3 mg/dL (ref 0.3–1.2)
Total Protein: 6.9 g/dL (ref 6.5–8.1)

## 2016-06-12 LAB — CBC WITH DIFFERENTIAL/PLATELET
BASOS PCT: 1 %
Basophils Absolute: 0 10*3/uL (ref 0–0.1)
EOS ABS: 0.1 10*3/uL (ref 0–0.7)
EOS PCT: 2 %
HCT: 35.1 % (ref 35.0–47.0)
HEMOGLOBIN: 12 g/dL (ref 12.0–16.0)
LYMPHS ABS: 1.6 10*3/uL (ref 1.0–3.6)
Lymphocytes Relative: 22 %
MCH: 29.6 pg (ref 26.0–34.0)
MCHC: 34.2 g/dL (ref 32.0–36.0)
MCV: 86.8 fL (ref 80.0–100.0)
MONOS PCT: 8 %
Monocytes Absolute: 0.6 10*3/uL (ref 0.2–0.9)
NEUTROS PCT: 67 %
Neutro Abs: 4.8 10*3/uL (ref 1.4–6.5)
PLATELETS: 160 10*3/uL (ref 150–440)
RBC: 4.05 MIL/uL (ref 3.80–5.20)
RDW: 14.2 % (ref 11.5–14.5)
WBC: 7.2 10*3/uL (ref 3.6–11.0)

## 2016-06-12 LAB — URINALYSIS COMPLETE WITH MICROSCOPIC (ARMC ONLY)
BACTERIA UA: NONE SEEN
Bilirubin Urine: NEGATIVE
GLUCOSE, UA: NEGATIVE mg/dL
Hgb urine dipstick: NEGATIVE
Ketones, ur: NEGATIVE mg/dL
LEUKOCYTES UA: NEGATIVE
Nitrite: NEGATIVE
Protein, ur: NEGATIVE mg/dL
SPECIFIC GRAVITY, URINE: 1.019 (ref 1.005–1.030)
pH: 5 (ref 5.0–8.0)

## 2016-06-12 LAB — PROTIME-INR
INR: 1.01
PROTHROMBIN TIME: 13.3 s (ref 11.4–15.2)

## 2016-06-12 LAB — MRSA PCR SCREENING: MRSA BY PCR: POSITIVE — AB

## 2016-06-12 SURGERY — FIXATION, FRACTURE, INTERTROCHANTERIC, WITH INTRAMEDULLARY ROD
Anesthesia: General | Laterality: Left

## 2016-06-12 MED ORDER — LACTATED RINGERS IV SOLN
INTRAVENOUS | Status: DC | PRN
  Administered 2016-06-12: 16:00:00 via INTRAVENOUS

## 2016-06-12 MED ORDER — VANCOMYCIN HCL IN DEXTROSE 750-5 MG/150ML-% IV SOLN
750.0000 mg | Freq: Two times a day (BID) | INTRAVENOUS | Status: AC
  Administered 2016-06-13: 750 mg via INTRAVENOUS
  Filled 2016-06-12: qty 150

## 2016-06-12 MED ORDER — FENTANYL CITRATE (PF) 100 MCG/2ML IJ SOLN
INTRAMUSCULAR | Status: AC
  Administered 2016-06-12: 25 ug via INTRAVENOUS
  Filled 2016-06-12: qty 2

## 2016-06-12 MED ORDER — BENZONATATE 100 MG PO CAPS: 100.0000 mg | ORAL_CAPSULE | Freq: Three times a day (TID) | ORAL | Status: DC | PRN

## 2016-06-12 MED ORDER — ONDANSETRON HCL 4 MG/2ML IJ SOLN
INTRAMUSCULAR | Status: DC | PRN
  Administered 2016-06-12: 4 mg via INTRAVENOUS

## 2016-06-12 MED ORDER — NEOMYCIN-POLYMYXIN B GU 40-200000 IR SOLN
Status: DC | PRN
  Administered 2016-06-12: 2 mL

## 2016-06-12 MED ORDER — ACETAMINOPHEN 325 MG PO TABS
650.0000 mg | ORAL_TABLET | Freq: Four times a day (QID) | ORAL | Status: DC | PRN
  Administered 2016-06-13: 650 mg via ORAL
  Filled 2016-06-12: qty 2

## 2016-06-12 MED ORDER — DIPHENHYDRAMINE HCL 12.5 MG/5ML PO ELIX: 12.5000 mg | ORAL_SOLUTION | ORAL | Status: DC | PRN

## 2016-06-12 MED ORDER — FERROUS SULFATE 325 (65 FE) MG PO TABS
325.0000 mg | ORAL_TABLET | Freq: Every day | ORAL | Status: DC
  Administered 2016-06-13 – 2016-06-15 (×2): 325 mg via ORAL
  Filled 2016-06-12 (×3): qty 1

## 2016-06-12 MED ORDER — ONDANSETRON HCL 4 MG PO TABS
4.0000 mg | ORAL_TABLET | Freq: Four times a day (QID) | ORAL | Status: DC | PRN
Start: 2016-06-12 — End: 2016-06-16

## 2016-06-12 MED ORDER — PHENYLEPHRINE HCL 10 MG/ML IJ SOLN
INTRAMUSCULAR | Status: DC | PRN
  Administered 2016-06-12: 50 ug via INTRAVENOUS

## 2016-06-12 MED ORDER — FLEET ENEMA 7-19 GM/118ML RE ENEM: 1.0000 | ENEMA | Freq: Once | RECTAL | Status: DC | PRN

## 2016-06-12 MED ORDER — METOCLOPRAMIDE HCL 10 MG PO TABS: 5.0000 mg | ORAL_TABLET | Freq: Three times a day (TID) | ORAL | Status: DC | PRN

## 2016-06-12 MED ORDER — PANTOPRAZOLE SODIUM 40 MG PO TBEC
40.0000 mg | DELAYED_RELEASE_TABLET | Freq: Every day | ORAL | Status: DC
  Administered 2016-06-13 – 2016-06-15 (×2): 40 mg via ORAL
  Filled 2016-06-12 (×3): qty 1

## 2016-06-12 MED ORDER — RISAQUAD PO CAPS
1.0000 | ORAL_CAPSULE | Freq: Two times a day (BID) | ORAL | Status: DC
  Administered 2016-06-12 – 2016-06-15 (×5): 1 via ORAL
  Filled 2016-06-12 (×6): qty 1

## 2016-06-12 MED ORDER — VANCOMYCIN HCL IN DEXTROSE 750-5 MG/150ML-% IV SOLN
750.0000 mg | Freq: Once | INTRAVENOUS | Status: AC
  Administered 2016-06-12: 750 mg via INTRAVENOUS
  Filled 2016-06-12: qty 150

## 2016-06-12 MED ORDER — METOCLOPRAMIDE HCL 5 MG/ML IJ SOLN: 5.0000 mg | Freq: Three times a day (TID) | INTRAMUSCULAR | Status: DC | PRN

## 2016-06-12 MED ORDER — HYDROMORPHONE HCL 1 MG/ML IJ SOLN
0.5000 mg | INTRAMUSCULAR | Status: DC | PRN
  Administered 2016-06-12 – 2016-06-13 (×2): 0.5 mg via INTRAVENOUS
  Filled 2016-06-12 (×2): qty 1

## 2016-06-12 MED ORDER — ACETAMINOPHEN 325 MG PO TABS: 650.0000 mg | ORAL_TABLET | Freq: Three times a day (TID) | ORAL | Status: DC

## 2016-06-12 MED ORDER — DOCUSATE SODIUM 100 MG PO CAPS: 100.0000 mg | ORAL_CAPSULE | Freq: Two times a day (BID) | ORAL | Status: DC

## 2016-06-12 MED ORDER — ONDANSETRON HCL 4 MG/2ML IJ SOLN: 4.0000 mg | Freq: Four times a day (QID) | INTRAMUSCULAR | Status: DC | PRN

## 2016-06-12 MED ORDER — ONDANSETRON HCL 4 MG PO TABS: 4.0000 mg | ORAL_TABLET | Freq: Three times a day (TID) | ORAL | Status: DC | PRN

## 2016-06-12 MED ORDER — MAGNESIUM HYDROXIDE 400 MG/5ML PO SUSP
30.0000 mL | Freq: Every day | ORAL | Status: DC | PRN
  Administered 2016-06-14: 30 mL via ORAL
  Filled 2016-06-12: qty 30

## 2016-06-12 MED ORDER — TRAMADOL HCL 50 MG PO TABS: 50.0000 mg | ORAL_TABLET | Freq: Two times a day (BID) | ORAL | Status: DC | PRN

## 2016-06-12 MED ORDER — OXYCODONE HCL 5 MG PO TABS
5.0000 mg | ORAL_TABLET | ORAL | Status: DC | PRN
  Administered 2016-06-12 – 2016-06-13 (×2): 10 mg via ORAL
  Filled 2016-06-12 (×2): qty 2

## 2016-06-12 MED ORDER — SODIUM CHLORIDE 0.9 % IV BOLUS (SEPSIS)
500.0000 mL | Freq: Once | INTRAVENOUS | Status: AC
  Administered 2016-06-12: 500 mL via INTRAVENOUS

## 2016-06-12 MED ORDER — PROPOFOL 10 MG/ML IV BOLUS
INTRAVENOUS | Status: DC | PRN
  Administered 2016-06-12: 80 mg via INTRAVENOUS

## 2016-06-12 MED ORDER — DOCUSATE SODIUM 100 MG PO CAPS
100.0000 mg | ORAL_CAPSULE | Freq: Two times a day (BID) | ORAL | Status: DC
  Administered 2016-06-12 – 2016-06-15 (×5): 100 mg via ORAL
  Filled 2016-06-12 (×6): qty 1

## 2016-06-12 MED ORDER — BUPIVACAINE-EPINEPHRINE (PF) 0.5% -1:200000 IJ SOLN
INTRAMUSCULAR | Status: AC
  Filled 2016-06-12: qty 30

## 2016-06-12 MED ORDER — NEOMYCIN-POLYMYXIN B GU 40-200000 IR SOLN
Status: AC
  Filled 2016-06-12: qty 2

## 2016-06-12 MED ORDER — MORPHINE SULFATE (PF) 2 MG/ML IV SOLN
0.5000 mg | Freq: Once | INTRAVENOUS | Status: AC
  Administered 2016-06-12: 0.5 mg via INTRAVENOUS

## 2016-06-12 MED ORDER — POLYETHYLENE GLYCOL 3350 17 G PO PACK: 17.0000 g | PACK | Freq: Every day | ORAL | Status: DC | PRN

## 2016-06-12 MED ORDER — ONDANSETRON HCL 4 MG/2ML IJ SOLN: 4.0000 mg | Freq: Once | INTRAMUSCULAR | Status: DC | PRN

## 2016-06-12 MED ORDER — SODIUM CHLORIDE 0.9 % IV SOLN
INTRAVENOUS | Status: DC
  Administered 2016-06-12: 13:00:00 via INTRAVENOUS

## 2016-06-12 MED ORDER — MORPHINE SULFATE (PF) 2 MG/ML IV SOLN
2.0000 mg | Freq: Once | INTRAVENOUS | Status: AC
Start: 2016-06-12 — End: 2016-06-12
  Administered 2016-06-12: 2 mg via INTRAVENOUS
  Filled 2016-06-12: qty 1

## 2016-06-12 MED ORDER — ENOXAPARIN SODIUM 30 MG/0.3ML ~~LOC~~ SOLN
30.0000 mg | SUBCUTANEOUS | Status: DC
  Administered 2016-06-13 – 2016-06-15 (×3): 30 mg via SUBCUTANEOUS
  Filled 2016-06-12 (×3): qty 0.3

## 2016-06-12 MED ORDER — HYDROCODONE-ACETAMINOPHEN 5-325 MG PO TABS: 1.0000 | ORAL_TABLET | Freq: Four times a day (QID) | ORAL | Status: DC | PRN

## 2016-06-12 MED ORDER — ACETAMINOPHEN 500 MG PO TABS
1000.0000 mg | ORAL_TABLET | Freq: Four times a day (QID) | ORAL | Status: AC
  Administered 2016-06-13 (×2): 1000 mg via ORAL
  Filled 2016-06-12 (×3): qty 2

## 2016-06-12 MED ORDER — BOOST / RESOURCE BREEZE PO LIQD
1.0000 | Freq: Two times a day (BID) | ORAL | Status: DC
  Administered 2016-06-14 – 2016-06-15 (×2): 1 via ORAL

## 2016-06-12 MED ORDER — BUPIVACAINE-EPINEPHRINE (PF) 0.5% -1:200000 IJ SOLN
INTRAMUSCULAR | Status: DC | PRN
  Administered 2016-06-12: 30 mL via PERINEURAL

## 2016-06-12 MED ORDER — CLINDAMYCIN PHOSPHATE 600 MG/50ML IV SOLN
600.0000 mg | Freq: Once | INTRAVENOUS | Status: DC
  Filled 2016-06-12: qty 50

## 2016-06-12 MED ORDER — KCL IN DEXTROSE-NACL 20-5-0.9 MEQ/L-%-% IV SOLN
INTRAVENOUS | Status: DC
  Administered 2016-06-13 – 2016-06-15 (×3): via INTRAVENOUS
  Filled 2016-06-12 (×10): qty 1000

## 2016-06-12 MED ORDER — BISACODYL 10 MG RE SUPP: 10.0000 mg | Freq: Every day | RECTAL | Status: DC | PRN

## 2016-06-12 MED ORDER — ACETAMINOPHEN 650 MG RE SUPP
650.0000 mg | Freq: Four times a day (QID) | RECTAL | Status: DC | PRN
Start: 2016-06-12 — End: 2016-06-14

## 2016-06-12 MED ORDER — FENTANYL CITRATE (PF) 100 MCG/2ML IJ SOLN
25.0000 ug | INTRAMUSCULAR | Status: DC | PRN
  Administered 2016-06-12: 25 ug via INTRAVENOUS

## 2016-06-12 MED ORDER — MORPHINE SULFATE (PF) 2 MG/ML IV SOLN
0.5000 mg | INTRAVENOUS | Status: DC | PRN
  Administered 2016-06-12: 0.5 mg via INTRAVENOUS
  Filled 2016-06-12 (×2): qty 1

## 2016-06-12 MED ORDER — METOPROLOL TARTRATE 25 MG PO TABS
25.0000 mg | ORAL_TABLET | Freq: Three times a day (TID) | ORAL | Status: DC
  Administered 2016-06-12 – 2016-06-13 (×4): 25 mg via ORAL
  Filled 2016-06-12 (×4): qty 1

## 2016-06-12 MED ORDER — VITAMIN B-12 1000 MCG PO TABS
1000.0000 ug | ORAL_TABLET | Freq: Every day | ORAL | Status: DC
  Administered 2016-06-13 – 2016-06-15 (×2): 1000 ug via ORAL
  Filled 2016-06-12 (×3): qty 1

## 2016-06-12 SURGICAL SUPPLY — 38 items
BIT DRILL 4.3MMS DISTAL GRDTED (BIT) ×1 IMPLANT
BNDG COHESIVE 4X5 TAN STRL (GAUZE/BANDAGES/DRESSINGS) ×3 IMPLANT
BNDG COHESIVE 6X5 TAN STRL LF (GAUZE/BANDAGES/DRESSINGS) ×3 IMPLANT
CANISTER SUCT 1200ML W/VALVE (MISCELLANEOUS) ×3 IMPLANT
CHLORAPREP W/TINT 26ML (MISCELLANEOUS) ×6 IMPLANT
DRAPE C-ARMOR (DRAPES) ×3 IMPLANT
DRAPE SHEET LG 3/4 BI-LAMINATE (DRAPES) ×3 IMPLANT
DRILL 4.3MMS DISTAL GRADUATED (BIT) ×3
DRSG OPSITE POSTOP 3X4 (GAUZE/BANDAGES/DRESSINGS) ×6 IMPLANT
DRSG OPSITE POSTOP 4X6 (GAUZE/BANDAGES/DRESSINGS) ×3 IMPLANT
ELECT CAUTERY BLADE 6.4 (BLADE) ×3 IMPLANT
ELECT REM PT RETURN 9FT ADLT (ELECTROSURGICAL) ×3
ELECTRODE REM PT RTRN 9FT ADLT (ELECTROSURGICAL) ×1 IMPLANT
GAUZE SPONGE 4X4 12PLY STRL (GAUZE/BANDAGES/DRESSINGS) IMPLANT
GLOVE BIO SURGEON STRL SZ8 (GLOVE) ×12 IMPLANT
GLOVE INDICATOR 8.0 STRL GRN (GLOVE) ×3 IMPLANT
GOWN STRL REUS W/ TWL LRG LVL3 (GOWN DISPOSABLE) ×1 IMPLANT
GOWN STRL REUS W/ TWL XL LVL3 (GOWN DISPOSABLE) ×1 IMPLANT
GOWN STRL REUS W/TWL LRG LVL3 (GOWN DISPOSABLE) ×2
GOWN STRL REUS W/TWL XL LVL3 (GOWN DISPOSABLE) ×2
GUIDEPIN 3.2X17.5 THRD DISP (PIN) ×3 IMPLANT
GUIDEWIRE BALL NOSE 100CM (WIRE) ×3 IMPLANT
HFN LAG SCREW 10.5MM X 85MM (Screw) ×3 IMPLANT
MAT BLUE FLOOR 46X72 FLO (MISCELLANEOUS) ×3 IMPLANT
NAIL HIP FRA AFFIX 130X9X340 L (Nail) ×3 IMPLANT
NEEDLE FILTER BLUNT 18X 1/2SAF (NEEDLE) ×2
NEEDLE FILTER BLUNT 18X1 1/2 (NEEDLE) ×1 IMPLANT
NEEDLE HYPO 22GX1.5 SAFETY (NEEDLE) ×3 IMPLANT
NS IRRIG 500ML POUR BTL (IV SOLUTION) ×3 IMPLANT
PACK HIP COMPR (MISCELLANEOUS) ×3 IMPLANT
SCREW BONE CORTICAL 5.0X40 (Screw) ×3 IMPLANT
STAPLER SKIN PROX 35W (STAPLE) ×3 IMPLANT
STRAP SAFETY BODY (MISCELLANEOUS) ×3 IMPLANT
SUT VIC AB 1 CT1 36 (SUTURE) ×3 IMPLANT
SUT VIC AB 2-0 CT1 (SUTURE) ×6 IMPLANT
SYR 30ML LL (SYRINGE) ×3 IMPLANT
SYRINGE 10CC LL (SYRINGE) ×3 IMPLANT
TAPE MICROFOAM 4IN (TAPE) IMPLANT

## 2016-06-12 NOTE — Op Note (Signed)
06/12/2016  4:55 PM  Patient:   Melissa Cooper  Pre-Op Diagnosis:   Two-part left intertrochanteric hip fracture.  Post-Op Diagnosis:   Same.  Procedure:   Reduction and internal fixation of two-part intertrochanteric left hip fracture with Biomet Affyxis TFN nail.  Surgeon:   Maryagnes Amos, MD  Assistant:   None  Anesthesia:   General LMA  Findings:   As above  Complications:   None  EBL:   50 cc  Fluids:   800 cc crystalloid  UOP:   150 cc  TT:   None  Drains:   None  Closure:   Staples  Implants:   Biomet Affyxis 9 x 340 mm TFN with an 85 mm lag screw and a 42 mm distal interlocking screw  Brief Clinical Note:   The patient is a 82 year old female who sustained the above-noted injury late yesterday evening when she apparently lost her balance and fell while trying to get up from bed. The patient lives in a rehabilitation facility and has vascular dementia. The patient presented emergency room where x-rays demonstrated the above-noted injury. The patient has been cleared medically and presents at this time for reduction and internal fixation of the two-part intertrochanteric left hip fracture.  Procedure:   The patient was brought into the operating room and lain in the supine position. After adequate general laryngeal mask anesthesia was obtained, the uninjured leg was placed in a flexed and abducted position while the injured lower extremity was placed in longitudinal traction. The fracture was reduced using longitudinal traction and internal rotation. The adequacy of reduction was verified fluoroscopically in AP and lateral projections and found to be near anatomic. The lateral aspects of the left hip and thigh were prepped with ChloraPrep solution before being draped sterilely. Preoperative antibiotics were administered. A timeout was performed to verify the appropriate surgical site. The greater trochanter was identified fluoroscopically and an approximately 3 cm  incision made about 2-3 fingerbreadths above the tip of the greater trochanter. The incision was carried down through the subcutaneous tissues to expose the gluteal fascia. This was split the length of the incision, providing access to the tip of the trochanter. Under fluoroscopic guidance, a guidewire was drilled through the tip of the trochanter into the proximal metaphysis to the level of the lesser trochanter. After verifying its position fluoroscopically in AP and lateral projections, it was overreamed with the initial reamer to the depth of the lesser trochanter. A guidewire was passed down through the femoral canal to the supracondylar region. The adequacy of guidewire position was verified fluoroscopically in AP and lateral projections before the length of the guidewire within the canal was measured and found to be 360 mm. Therefore, a 340 mm nail was selected. The femoral canal was reamed sequentially using the flexible reamers, beginning with a 9 mm reamer and progressing to a 10 mm reamer. This provided good cortical chatter. The 9 x 340 mm Biomet Affyxis TFN rod was selected and advanced to the appropriate depth, as verified fluoroscopically. The guide system for the lag screw was positioned and advanced through an approximately 2 cm stab incision over the lateral aspect of the proximal femur. The guidewire was drilled up through the trochanteric femoral nail and into the femoral neck to rest within 5 mm of subchondral bone. After verifying its position in the femoral neck and head in both AP and lateral projections, the guidewire was measured and found to be optimally replicated by a 85 mm  lag screw. The guidewire was overreamed to the appropriate depth before the lag screw was inserted and advanced to the appropriate depth as verified fluoroscopically in AP and lateral projections. The locking screw was advanced, then backed off a quarter turn to set the lag screw. Again the adequacy of hardware  position and fracture reduction was verified fluoroscopically in AP and lateral projections and found to be excellent.  Attention was directed distally. Using the "perfect circle" technique, the leg and fluoroscopy machine were positioned appropriately. An approximate 1.5 cm stab incision was made over the skin at the appropriate point before the drill bit was advanced through the cortex and across the static hole of the nail. The appropriate length of the screw was determined before the 42 mm distal interlocking screw was positioned, then advanced and tightened securely. Again the adequacy of screw position was verified fluoroscopically in AP and lateral projections and found to be excellent.  The wounds were irrigated thoroughly with sterile saline solution before the deeper subcutaneous tissues were closed using 2-0 Vicryl interrupted sutures. The skin was closed using staples. A total of 30 cc of 0.5% Sensorcaine with epinephrine was injected in and around all incision sites before sterile occlusive dressings were applied to all wounds. The patient was transferred back to her hospital bed before she was awakened, extubated, and returned to the recovery room in satisfactory condition after tolerating the procedure well.

## 2016-06-12 NOTE — ED Triage Notes (Signed)
[facility].  Pt has shortening and outward rotation per EMS as well.  Pt confused at baseline.  Unknown time for patient down from fall.

## 2016-06-12 NOTE — Clinical Social Work Note (Signed)
Clinical Social Work Assessment  Patient Details  Name: Melissa Cooper MRN: [id number] Date of Birth: 09/16/1917  Date of referral:  06/12/16               Reason for consult:   (Femour fracture/admission)                Permission sought to share information with:  Family Supports, Magazine features editoracility Contact Representative Permission granted to share information::  Yes, Verbal Permission Granted  Name::        Agency::   [facility]  Relationship::     Contact Information:  [name] (son) [phone] and Ines BloomerCarl Franzen [phone]  Housing/Transportation Living arrangements for the past 2 months:  Skilled Nursing Facility [facility] Source of Information:  Adult Children Patient Interpreter Needed:  None Criminal Activity/Legal Involvement Pertinent to Current Situation/Hospitalization:  No - Comment as needed Significant Relationships:  Adult Children, Church Lives with:  Facility Resident Do you feel safe going back to the place where you live?  Yes Need for family participation in patient care:  Yes (Comment)  Care giving concerns: Family is worried a bit as this is patients first surgery ever   Social Worker assessment / plan: LCSW introduced myself to both patient and her sons. Both sons [name] and Baldo AshCarl are her HCPOA. They expressed she has a DNR. [facility] and has been there for 1 1/2 years. Her sons and daughter in law visit with her daily. She is oriented x4. She requires full assistance with all her ADL's but can feed herself. She is non ambulatory. She has a fracture of her left femour. Her vision is OK- she does have macular degeneration, she is hard of hearing and her speech is OK when she is wearing her dentures. She is incontinent.She has medicaid/medicare and [facility].   Employment status:  Retired Health and safety inspectornsurance information:  Armed forces operational officerMedicare, Medicaid [place] PT Recommendations:  Not assessed at this time Information /  Referral to community resources:  Skilled Nursing Facility  Patient/Family's Response to care:  They are hopeful she will do ok in surgery  Patient/Family's Understanding of and Emotional Response to Diagnosis, Current Treatment, and Prognosis: Patient and family is aware of her current health situation and are here ready and willing to support her.  Emotional Assessment Appearance:  Appears stated age Attitude/Demeanor/Rapport:  (S) Unable to Assess (Sons indicate she is oriented x4) Affect (typically observed):  Calm, Accepting, Unable to Assess Orientation:  Oriented to Self, Oriented to Place, Oriented to  Time, Oriented to Situation Alcohol / Substance use:  Never Used Psych involvement (Current and /or in the community):  No (Comment)  Discharge Needs  Concerns to be addressed:  No discharge needs identified Readmission within the last 30 days:  No Current discharge risk:  None Barriers to Discharge:  Continued Medical Work up   Kelly ServicesBandi, Aerika Groll M, LCSW 06/12/2016, 8:23 AM

## 2016-06-12 NOTE — Anesthesia Preprocedure Evaluation (Signed)
Anesthesia Evaluation  Patient identified by MRN, date of birth, ID band Patient confused    Reviewed: Allergy & Precautions, NPO status , Patient's Chart, lab work & pertinent test results  History of Anesthesia Complications Negative for: history of anesthetic complications  Airway Mallampati: III       Dental  (+) Edentulous Upper, Edentulous Lower   Pulmonary sleep apnea ,           Cardiovascular hypertension, negative cardio ROS       Neuro/Psych Dementia    GI/Hepatic   Endo/Other    Renal/GU Renal InsufficiencyRenal disease     Musculoskeletal  (+) Arthritis , Osteoarthritis,    Abdominal   Peds  Hematology  (+) anemia ,   Anesthesia Other Findings   Reproductive/Obstetrics                             Anesthesia Physical Anesthesia Plan  ASA: III  Anesthesia Plan: General   Post-op Pain Management:    Induction: Intravenous  Airway Management Planned: Oral ETT  Additional Equipment:   Intra-op Plan:   Post-operative Plan:   Informed Consent: I have reviewed the patients History and Physical, chart, labs and discussed the procedure including the risks, benefits and alternatives for the proposed anesthesia with the patient or authorized representative who has indicated his/her understanding and acceptance.     Plan Discussed with:   Anesthesia Plan Comments:         Anesthesia Quick Evaluation

## 2016-06-12 NOTE — NC FL2 (Signed)
[place] MEDICAID FL2 LEVEL OF CARE SCREENING TOOL     IDENTIFICATION  Patient Name: Melissa Cooper Birthdate: March 31, 1918 Sex: female Admission Date (Current Location): 06/12/2016  [place] and [place] Number:  [address] [id number] Facility and Address:  [facility], [address] [place], [place] [address]      Provider Number: [id number]  Attending Physician Name and Address:  Enedina Finner, MD  Relative Name and Phone Number:       Current Level of Care: Hospital Recommended Level of Care: Skilled Nursing Facility Prior Approval Number:    Date Approved/Denied:   PASRR Number:  [id number]  Discharge Plan: SNF    Current Diagnoses: Patient Active Problem List   Diagnosis Date Noted  . Hip fracture (HCC) 06/12/2016  . Protein-calorie malnutrition, severe 09/30/2015  . Recurrent colitis due to Clostridium difficile 09/28/2015  . Clostridium difficile colitis 09/05/2015  . Pressure ulcer 08/30/2015  . Sepsis (HCC) 08/29/2015    Orientation RESPIRATION BLADDER Height & Weight     Self, Situation, Time  Normal Incontinent Weight: 145 lb (65.8 kg) Height:  5\' 2"  (157.5 cm)  BEHAVIORAL SYMPTOMS/MOOD NEUROLOGICAL BOWEL NUTRITION STATUS      Incontinent Diet (Normal)  AMBULATORY STATUS COMMUNICATION OF NEEDS Skin   Extensive Assist Verbally Normal                       Personal Care Assistance Level of Assistance  Bathing, Feeding, Dressing Bathing Assistance: Limited assistance Feeding assistance: Independent Dressing Assistance: Limited assistance     Functional Limitations Info  Sight, Hearing, Speech Sight Info: Impaired Hearing Info: Impaired Speech Info: Adequate    SPECIAL CARE FACTORS FREQUENCY  PT (By licensed PT), OT (By licensed OT)     PT Frequency: x5 OT Frequency: x5            Contractures Contractures Info: Not present    Additional Factors Info  Allergies   Allergies Info:  Actinel,Ceftin,Evista,Miacalcin,Zyrtec           Current Medications (06/12/2016):  This is the current hospital active medication list No current facility-administered medications for this encounter.    Current Outpatient Prescriptions  Medication Sig Dispense Refill  . acetaminophen (TYLENOL ARTHRITIS PAIN) 650 MG CR tablet Take 650 mg by mouth 3 (three) times daily.    Marland Kitchen acidophilus (RISAQUAD) CAPS capsule Take 1 capsule by mouth 2 (two) times daily. 60 capsule 3  . benzonatate (TESSALON) 100 MG capsule Take 1 capsule (100 mg total) by mouth 3 (three) times daily as needed for cough. 20 capsule 0  . ferrous sulfate 325 (65 FE) MG tablet Take 325 mg by mouth daily.    . metoprolol tartrate (LOPRESSOR) 25 MG tablet Take 1 tablet (25 mg total) by mouth 3 (three) times daily. HOLD IF HR<60 or SBP<100 90 tablet 2  . Nutritional Supplements (ENSURE CLEAR) LIQD Take 1 Package by mouth 2 (two) times daily. Between meals    . omeprazole (PRILOSEC) 20 MG capsule Take 20 mg by mouth daily.    . ondansetron (ZOFRAN) 4 MG tablet Take 4 mg by mouth 3 (three) times daily as needed for nausea or vomiting.    . traMADol (ULTRAM) 50 MG tablet Take 1 tablet (50 mg total) by mouth every 6 (six) hours as needed for moderate pain or severe pain (As needed for Mod-severe pain). 20 tablet 0  . vitamin B-12 1000 MCG tablet Take 1 tablet (1,000 mcg total) by mouth daily.  30 tablet 0     Discharge Medications: Please see discharge summary for a list of discharge medications.  Relevant Imaging Results:  Relevant Lab Results:   Additional Information [id number]  Cheron SchaumannBandi, Claudine M, [place]

## 2016-06-12 NOTE — Anesthesia Postprocedure Evaluation (Signed)
Anesthesia Post Note  Patient: Melissa Cooper  Procedure(s) Performed: Procedure(s) (LRB): INTRAMEDULLARY (IM) NAIL INTERTROCHANTRIC (Left)  Patient location during evaluation: PACU Anesthesia Type: General Level of consciousness: awake and alert Pain management: pain level controlled Vital Signs Assessment: post-procedure vital signs reviewed and stable Respiratory status: spontaneous breathing and respiratory function stable Cardiovascular status: stable Anesthetic complications: no    Last Vitals:  Vitals:   06/12/16 1655 06/12/16 1720  BP: (!) 146/92   Pulse: 88 88  Resp: 20 (!) 25  Temp: 36.4 C     Last Pain:  Vitals:   06/12/16 1720  TempSrc:   PainSc: 1                  KEPHART,WILLIAM K

## 2016-06-12 NOTE — Consult Note (Signed)
ORTHOPAEDIC CONSULTATION  REQUESTING PHYSICIAN: Enedina Finner, MD  Chief Complaint:   Left hip pain.  History of Present Illness: Melissa Cooper is a 82 y.o. female with multiple medical problems, including dementia. She lives full-time in a rehabilitation facility and is not ambulatory. She will stand to transfer with assistance only. Apparently, she did try to get out of her bed last evening and fell, injuring her left hip. She was brought to the emergency room where x-rays demonstrated an essentially nondisplaced intertrochanteric fracture of her left hip. She was admitted to the hospitalist service for medical clearance and definitive management of her injury. There does not appear to be any associated injuries related to the fall. She cannot communicate the exact circumstances regarding her fall.  Past Medical History:  Diagnosis Date  . CKD (chronic kidney disease)    stage 3  . Dementia   . HTN (hypertension)   . Hypercholesteremia   . OSA (obstructive sleep apnea)   . Osteoarthritis   . Osteoarthritis   . PA (pernicious anemia)    Past Surgical History:  Procedure Laterality Date  . none     Social History   Social History  . Marital status: Married    Spouse name: N/A  . Number of children: N/A  . Years of education: N/A   Social History Main Topics  . Smoking status: Never Smoker  . Smokeless tobacco: Never Used  . Alcohol use No  . Drug use: No  . Sexual activity: No   Other Topics Concern  . None   Social History Narrative   From [facility]. Mostly bedbound, can get up with assistance.   No family history on file. Allergies  Allergen Reactions  . Actonel [Risedronate Sodium] Other (See Comments)    Unknown reaction  . Ceftin [Cefuroxime Axetil] Other (See Comments)    Unknown reaction  . Evista [Raloxifene] Other (See Comments)    Unknown reaction  . Miacalcin [Calcitonin (Salmon)]  Other (See Comments)    Unknown reaction  . Zyrtec [Cetirizine] Other (See Comments)    Unknown reaction   Prior to Admission medications   Medication Sig Start Date End Date Taking? Authorizing Provider  acetaminophen (TYLENOL ARTHRITIS PAIN) 650 MG CR tablet Take 650 mg by mouth 3 (three) times daily.   Yes Historical Provider, MD  acidophilus (RISAQUAD) CAPS capsule Take 1 capsule by mouth 2 (two) times daily. 09/05/15  Yes Gale Journey, MD  benzonatate (TESSALON) 100 MG capsule Take 1 capsule (100 mg total) by mouth 3 (three) times daily as needed for cough. 10/06/15  Yes Enid Baas, MD  ferrous sulfate 325 (65 FE) MG tablet Take 325 mg by mouth daily.   Yes Historical Provider, MD  metoprolol tartrate (LOPRESSOR) 25 MG tablet Take 1 tablet (25 mg total) by mouth 3 (three) times daily. HOLD IF HR<60 or SBP<100 10/06/15  Yes Enid Baas, MD  Nutritional Supplements (ENSURE CLEAR) LIQD Take 1 Package by mouth 2 (two) times daily. Between meals   Yes Historical Provider, MD  omeprazole (PRILOSEC) 20 MG capsule Take 20 mg by mouth daily.   Yes Historical Provider, MD  ondansetron (ZOFRAN) 4 MG tablet Take 4 mg by mouth 3 (three) times daily as needed for nausea or vomiting.   Yes Historical Provider, MD  traMADol (ULTRAM) 50 MG tablet Take 1 tablet (50 mg total) by mouth every 6 (six) hours as needed for moderate pain or severe pain (As needed for Mod-severe pain). 10/06/15  Yes Enid Baasadhika Kalisetti, MD  vitamin B-12 1000 MCG tablet Take 1 tablet (1,000 mcg total) by mouth daily. 09/05/15  Yes Gale Journeyatherine P Walsh, MD   Dg Chest 1 View  Result Date: 06/12/2016 CLINICAL DATA:  82 y/o F; fall this morning with pain on the left side. EXAM: CHEST 1 VIEW COMPARISON:  09/28/2015 chest radiograph FINDINGS: Stable cardiomediastinal silhouette within normal limits. Aortic atherosclerosis with arch calcifications. Clear lungs. No pneumothorax or pleural effusion. Moderate rightward curvature of upper  lumbar spine. No acute fracture identified. IMPRESSION: Clear lungs. No acute osseous abnormality identified. If clinically indicated consider dedicated rib radiographs if there is focal pain. Electronically Signed   By: Mitzi HansenLance  Furusawa-Stratton M.D.   On: 06/12/2016 06:46   Dg Hip Unilat With Pelvis 2-3 Views Left  Result Date: 06/12/2016 CLINICAL DATA:  Fall this morning with left hip pain. EXAM: DG HIP (WITH OR WITHOUT PELVIS) 2-3V LEFT COMPARISON:  None. FINDINGS: Nondisplaced intertrochanteric left hip fracture. Femoral head is seated in the acetabulum. No additional acute fracture. Pubic symphysis and sacroiliac joints are congruent. The bones are under mineralized. Scoliosis and degenerative change in the included lumbar spine. IMPRESSION: Nondisplaced intertrochanteric left hip fracture. Electronically Signed   By: Rubye OaksMelanie  Ehinger M.D.   On: 06/12/2016 06:46    Positive ROS: All other systems have been reviewed and were otherwise negative with the exception of those mentioned in the HPI and as above.  Physical Exam: General:  Alert, no acute distress Psychiatric:  Patient is not competent for consent, but exhibits normal mood and affect   Cardiovascular:  No pedal edema Respiratory:  No wheezing, non-labored breathing GI:  Abdomen is soft and non-tender Skin:  No lesions in the area of chief complaint Neurologic:  Sensation intact distally Lymphatic:  No axillary or cervical lymphadenopathy  Orthopedic Exam:  Orthopedic examination is limited to the left hip and lower extremity. The left lower extremity appears to be well aligned as compared to the right. Skin inspection around the left hip is unremarkable. She has mild tenderness to palpation over the lateral aspect of the left hip. She has more significant pain with any attempted active or passive range of motion of the left hip. She is able to dorsiflex and plantarflex her toes and ankle. Sensations intact to light touch to all  digits patient's of her left lower extremity. She has good capillary refill to her left foot.  X-rays:  X-rays of the pelvis and left hip are available for review. The findings are as described above. There is a nondisplaced 2-part intertrochanteric fracture. No significant degenerative changes are noted. No lytic lesions are identified.  Assessment: Essentially nondisplaced 2-part intertrochanteric fracture, left hip.  Plan: The treatment options are discussed the patient and her son, who is at the bedside. The son is the patient's power of attorney. We discussed both the surgical and nonsurgical options. The patient's son would like to proceed with surgical intervention to include intramedullary nailing of the intertrochanteric left hip fracture in the hopes that this would provide the patient with more comfort during transfers and standing for transfers. This procedure has been discussed in detail, as have the potential risks (including bleeding, infection, nerve and/or blood vessel injury, persistent or recurrent pain, stiffness, malunion and/or nonunion, need for further surgery, blood clots, strokes, heart attacks and/or arrhythmias, etc.) and benefits. The patient's son states his understanding and wishes to proceed. A formal written consent will be obtained by the nursing staff.  Thank you for  asking me to participate in the care of this most pleasant family. I will be happy to follow her with you.   Maryagnes Amos, MD  Beeper #:  [phone]  06/12/2016 12:50 PM

## 2016-06-12 NOTE — Clinical Social Work Note (Signed)
CSW received consult for SNF placement for the patient. The patient is from [facility] and will return once stable. ED CSW completed assessment. CSW will con't to follow for dc needs.  Argentina PonderKaren Martha Emiel Kielty, MSW, LCSW-A

## 2016-06-12 NOTE — Anesthesia Procedure Notes (Signed)
Procedure Name: LMA Insertion Date/Time: 06/12/2016 3:50 PM Performed by: [id number], Kenderick Kobler Pre-anesthesia Checklist: Timeout performed, Patient being monitored, Patient identified, Emergency Drugs available and Suction available Patient Re-evaluated:Patient Re-evaluated prior to inductionOxygen Delivery Method: Circle system utilized Preoxygenation: Pre-oxygenation with 100% oxygen Intubation Type: IV induction Ventilation: Mask ventilation without difficulty LMA: LMA inserted LMA Size: 4.0 Tube type: Oral Number of attempts: 1 Placement Confirmation: positive ETCO2,  CO2 detector and breath sounds checked- equal and bilateral Tube secured with: Tape

## 2016-06-12 NOTE — Transfer of Care (Signed)
Immediate Anesthesia Transfer of Care Note  Patient: Melissa Cooper  Procedure(s) Performed: Procedure(s): INTRAMEDULLARY (IM) NAIL INTERTROCHANTRIC (Left)  Patient Location: PACU  Anesthesia Type:General  Level of Consciousness: awake and alert   Airway & Oxygen Therapy: Patient Spontanous Breathing and Patient connected to face mask oxygen  Post-op Assessment: Report given to RN  Post vital signs: Reviewed and stable  Last Vitals:  Vitals:   06/12/16 1251 06/12/16 1655  BP: (!) 132/53 (!) 146/92  Pulse: 89 88  Resp:  20  Temp:  36.4 C    Last Pain:  Vitals:   06/12/16 1051  TempSrc:   PainSc: Asleep         Complications: No apparent anesthesia complications

## 2016-06-12 NOTE — ED Provider Notes (Signed)
[facility] Emergency Department Provider Note  ____________________________________________   First MD Initiated Contact with Patient 06/12/16 [phone]     (approximate)  I have reviewed the triage vital signs and the nursing notes.   HISTORY  Chief Complaint Fall  Patient has dementia at baseline which limits her ability to give a history  HPI Joletta L Satcher is a 82 y.o. female with dementia at baseline and who is nonambulatory and nonweightbearing at baseline but does transfers from bed to wheelchair.  She lives at a skilled nursing facility.  She probably tried to get up on her own during the night and suffered a fall.  She has severe tenderness to her left hip with some external rotation and shortening.  She has no other obvious injuries at this time although she does have some old bruising to her forehead.  She was not in any distress when she first arrived and was not complaining of any pain although subsequently she began moaning due to the pain in her left hip.   Past Medical History:  Diagnosis Date  . CKD (chronic kidney disease)    stage 3  . Dementia   . HTN (hypertension)   . Hypercholesteremia   . OSA (obstructive sleep apnea)   . Osteoarthritis   . Osteoarthritis   . PA (pernicious anemia)     Patient Active Problem List   Diagnosis Date Noted  . Protein-calorie malnutrition, severe 09/30/2015  . Recurrent colitis due to Clostridium difficile 09/28/2015  . Clostridium difficile colitis 09/05/2015  . Pressure ulcer 08/30/2015  . Sepsis (HCC) 08/29/2015    Past Surgical History:  Procedure Laterality Date  . none      Prior to Admission medications   Medication Sig Start Date End Date Taking? Authorizing Provider  acetaminophen (TYLENOL ARTHRITIS PAIN) 650 MG CR tablet Take 650 mg by mouth 3 (three) times daily.    Historical Provider, MD  acidophilus (RISAQUAD) CAPS capsule Take 1 capsule by mouth 2 (two) times daily. 09/05/15    Gale Journey, MD  benzonatate (TESSALON) 100 MG capsule Take 1 capsule (100 mg total) by mouth 3 (three) times daily as needed for cough. 10/06/15   Enid Baas, MD  feeding supplement, ENSURE ENLIVE, (ENSURE ENLIVE) LIQD Take 237 mLs by mouth 2 (two) times daily between meals. 09/05/15   Gale Journey, MD  ferrous fumarate (HEMOCYTE - 106 MG FE) 325 (106 Fe) MG TABS tablet Take 325 mg of iron by mouth daily.     Historical Provider, MD  metoprolol tartrate (LOPRESSOR) 25 MG tablet Take 1 tablet (25 mg total) by mouth 3 (three) times daily. HOLD IF HR<60 or SBP<100 10/06/15   Enid Baas, MD  ondansetron (ZOFRAN) 4 MG tablet Take 4 mg by mouth 3 (three) times daily as needed for nausea or vomiting.    Historical Provider, MD  pantoprazole (PROTONIX) 40 MG tablet Take 1 tablet (40 mg total) by mouth daily. 10/06/15   Enid Baas, MD  traMADol (ULTRAM) 50 MG tablet Take 1 tablet (50 mg total) by mouth every 6 (six) hours as needed for moderate pain or severe pain (As needed for Mod-severe pain). 10/06/15   Enid Baas, MD  vitamin B-12 1000 MCG tablet Take 1 tablet (1,000 mcg total) by mouth daily. 09/05/15   Gale Journey, MD    Allergies Actonel [risedronate sodium]; Ceftin [cefuroxime axetil]; Evista [raloxifene]; Miacalcin [calcitonin (salmon)]; and Zyrtec [cetirizine]  No family history on file.  Social  History Social History  Substance Use Topics  . Smoking status: Never Smoker  . Smokeless tobacco: Never Used  . Alcohol use No    Review of Systems Level V caveat-unable to obtain a full review of systems due to the patient's dementia  ____________________________________________   PHYSICAL EXAM:  VITAL SIGNS: ED Triage Vitals  Enc Vitals Group     BP 06/12/16 0603 (!) 165/58     Pulse Rate 06/12/16 0603 66     Resp 06/12/16 0603 20     Temp 06/12/16 0603 97.9 F (36.6 C)     Temp src --      SpO2 --      Weight 06/12/16 0557 145 lb (65.8 kg)      Height 06/12/16 0557  (1.575 m)     Head Circumference --      Peak Flow --      Pain Score --      Pain Loc --      Pain Edu? --      Excl. in GC? --     Constitutional: Alert and will answer questions without difficulty.  Elderly but in no acute distress initially, subsequently developed left hip pain and was moaning Eyes: Conjunctivae are normal. PERRL. EOMI. Head: Subacute bruising on her forehead, no indication of acute injury Nose: No congestion/rhinnorhea. Mouth/Throat: Mucous membranes are moist.  Oropharynx non-erythematous. Neck: No stridor.  No meningeal signs.  No cervical spine tenderness to palpation. Cardiovascular: Normal rate, regular rhythm. Good peripheral circulation. Grossly normal heart sounds. Respiratory: Normal respiratory effort.  No retractions. Lungs CTAB. Gastrointestinal: Soft and nontender. No distention.  Musculoskeletal: No lower extremity tenderness nor edema.  Patient is holding her left lower extremity in sterile rotation and shortening is somewhat.  She has severe tenderness to palpation of the left hip although there is no obvious gross deformity Neurologic:  Normal speech and language. No gross focal neurologic deficits are appreciated.  Skin:  Skin is warm, dry and intact. No rash noted.   ____________________________________________   LABS (all labs ordered are listed, but only abnormal results are displayed)  Labs Reviewed  MRSA PCR SCREENING - Abnormal; Notable for the following:       Result Value   MRSA by PCR POSITIVE (*)    All other components within normal limits  COMPREHENSIVE METABOLIC PANEL - Abnormal; Notable for the following:    Glucose, Bld 105 (*)    BUN 37 (*)    Creatinine, Ser 1.22 (*)    ALT 9 (*)    GFR calc non Af Amer 36 (*)    GFR calc Af Amer 42 (*)    All other components within normal limits  URINALYSIS COMPLETEWITH MICROSCOPIC ([facility] ONLY) - Abnormal; Notable for the following:    Color, Urine YELLOW  (*)    APPearance CLEAR (*)    Squamous Epithelial / LPF 0-5 (*)    All other components within normal limits  CBC WITH DIFFERENTIAL/PLATELET  PROTIME-INR  CBC WITH DIFFERENTIAL/PLATELET  BASIC METABOLIC PANEL  TYPE AND SCREEN   ____________________________________________  EKG  ED ECG REPORT I, Ryder Man, the attending physician, personally viewed and interpreted this ECG.   Date: 06/12/2016  EKG Time: 07:21  Rate: 68  Rhythm: normal sinus rhythm  Axis: Normal  Intervals:Normal  ST&T Change: Non-specific ST segment / T-wave changes, but no evidence of acute ischemia.   ____________________________________________  RADIOLOGY   Dg Chest 1 View  Result Date: 06/12/2016 CLINICAL  DATA:  82 y/o F; fall this morning with pain on the left side. EXAM: CHEST 1 VIEW COMPARISON:  09/28/2015 chest radiograph FINDINGS: Stable cardiomediastinal silhouette within normal limits. Aortic atherosclerosis with arch calcifications. Clear lungs. No pneumothorax or pleural effusion. Moderate rightward curvature of upper lumbar spine. No acute fracture identified. IMPRESSION: Clear lungs. No acute osseous abnormality identified. If clinically indicated consider dedicated rib radiographs if there is focal pain. Electronically Signed   By: Mitzi Hansen M.D.   On: 06/12/2016 06:46   Dg Hip Unilat With Pelvis 2-3 Views Left  Result Date: 06/12/2016 CLINICAL DATA:  Fall this morning with left hip pain. EXAM: DG HIP (WITH OR WITHOUT PELVIS) 2-3V LEFT COMPARISON:  None. FINDINGS: Nondisplaced intertrochanteric left hip fracture. Femoral head is seated in the acetabulum. No additional acute fracture. Pubic symphysis and sacroiliac joints are congruent. The bones are under mineralized. Scoliosis and degenerative change in the included lumbar spine. IMPRESSION: Nondisplaced intertrochanteric left hip fracture. Electronically Signed   By: Rubye Oaks M.D.   On: 06/12/2016 06:46     ____________________________________________   PROCEDURES  Procedure(s) performed:   Procedures   Critical Care performed: No ____________________________________________   INITIAL IMPRESSION / ASSESSMENT AND PLAN / ED COURSE  Pertinent labs & imaging results that were available during my care of the patient were reviewed by me and considered in my medical decision making (see chart for details).  I will hold off on obtaining lab work until we check a hip fracture.  Her adult sons are present and agree with this.   Clinical Course  Value Comment By Time  DG Hip Unilat With Pelvis 2-3 Views Left Reviewed radiology report.  Calling to discuss with Dr. Joice Lofts. Loleta Rose, MD 10/14 [phone]   Spoke w/ Dr. Joice Lofts.  He pointed out that although the patient is non-ambulatory, she will be in a lot of pain with transfers, and surgery may help that.  Discussed with sons who are at bedside, and they would like to proceed with admission for surgery for the patient's comfort.  She is currently moaning in pain, so will proceed with hip fracture/pain control workup and management. Loleta Rose, MD 10/14 [phone]   Discussed with hospitalist who will admit.  Already put in consult for Dr. Joice Lofts. Loleta Rose, MD 10/14 [phone]    ____________________________________________  FINAL CLINICAL IMPRESSION(S) / ED DIAGNOSES  Final diagnoses:  Closed intertrochanteric fracture of hip, left, initial encounter [facility])     MEDICATIONS GIVEN DURING THIS VISIT:  Medications  morphine 2 MG/ML injection 2 mg (2 mg Intravenous Given 06/12/16 0731)     NEW OUTPATIENT MEDICATIONS STARTED DURING THIS VISIT:  New Prescriptions   No medications on file    Modified Medications   No medications on file    Discontinued Medications   No medications on file     Note:  This document was prepared using Dragon voice recognition software and may include unintentional dictation errors.    Loleta Rose,  MD 06/13/16 [phone]

## 2016-06-12 NOTE — H&P (Signed)
[facility] - [place] at [facility]   PATIENT NAME: Melissa Cooper    MR#:  [id number]  DATE OF BIRTH:  25-Mar-1918  DATE OF ADMISSION:  06/12/2016  PRIMARY CARE PHYSICIAN: Tillman Abide, MD   REQUESTING/REFERRING PHYSICIAN: Dr Fanny Bien  CHIEF COMPLAINT:   Status post fall at [facility] found to have left hip fracture HISTORY OF PRESENT ILLNESS:  Melissa Cooper  is a 82 y.o. female with a known history of Vascular dementia, CKD stage III, hypertension, hypercholesterolemia who is a poor historian comes emergency room after she had an accidental fall at [facility] when she was trying to get out during the nighttime. Patient was found on the floor screaming for help. She was brought to the emergency room was found to have nondisplaced intertrochanteric left hip fracture. Patient is unable to give any history history of review of system. Only thing she tells me if she does not have pain anywhere. No cardiac history listed in her past medical history and outpatient PCP notes. EKG shows normal sinus rhythm PAST MEDICAL HISTORY:   Past Medical History:  Diagnosis Date  . CKD (chronic kidney disease)    stage 3  . Dementia   . HTN (hypertension)   . Hypercholesteremia   . OSA (obstructive sleep apnea)   . Osteoarthritis   . Osteoarthritis   . PA (pernicious anemia)     PAST SURGICAL HISTOIRY:   Past Surgical History:  Procedure Laterality Date  . none      SOCIAL HISTORY:   Social History  Substance Use Topics  . Smoking status: Never Smoker  . Smokeless tobacco: Never Used  . Alcohol use No    FAMILY HISTORY:  No family history on file.  DRUG ALLERGIES:   Allergies  Allergen Reactions  . Actonel [Risedronate Sodium] Other (See Comments)    Unknown reaction  . Ceftin [Cefuroxime Axetil] Other (See Comments)    Unknown reaction  . Evista [Raloxifene] Other (See Comments)    Unknown reaction  . Miacalcin [Calcitonin (Salmon)] Other (See  Comments)    Unknown reaction  . Zyrtec [Cetirizine] Other (See Comments)    Unknown reaction    REVIEW OF SYSTEMS:  Review of Systems  Unable to perform ROS: Dementia     MEDICATIONS AT HOME:   Prior to Admission medications   Medication Sig Start Date End Date Taking? Authorizing Provider  acetaminophen (TYLENOL ARTHRITIS PAIN) 650 MG CR tablet Take 650 mg by mouth 3 (three) times daily.   Yes Historical Provider, MD  acidophilus (RISAQUAD) CAPS capsule Take 1 capsule by mouth 2 (two) times daily. 09/05/15  Yes Gale Journey, MD  benzonatate (TESSALON) 100 MG capsule Take 1 capsule (100 mg total) by mouth 3 (three) times daily as needed for cough. 10/06/15  Yes Enid Baas, MD  ferrous sulfate 325 (65 FE) MG tablet Take 325 mg by mouth daily.   Yes Historical Provider, MD  metoprolol tartrate (LOPRESSOR) 25 MG tablet Take 1 tablet (25 mg total) by mouth 3 (three) times daily. HOLD IF HR<60 or SBP<100 10/06/15  Yes Enid Baas, MD  Nutritional Supplements (ENSURE CLEAR) LIQD Take 1 Package by mouth 2 (two) times daily. Between meals   Yes Historical Provider, MD  omeprazole (PRILOSEC) 20 MG capsule Take 20 mg by mouth daily.   Yes Historical Provider, MD  ondansetron (ZOFRAN) 4 MG tablet Take 4 mg by mouth 3 (three) times daily as needed for nausea or vomiting.   Yes  Historical Provider, MD  traMADol (ULTRAM) 50 MG tablet Take 1 tablet (50 mg total) by mouth every 6 (six) hours as needed for moderate pain or severe pain (As needed for Mod-severe pain). 10/06/15  Yes Enid Baas, MD  vitamin B-12 1000 MCG tablet Take 1 tablet (1,000 mcg total) by mouth daily. 09/05/15  Yes Gale Journey, MD      VITAL SIGNS:  Blood pressure (!) 124/40, pulse 79, temperature 97.9 F (36.6 C), resp. rate 18, height 5\' 2"  (1.575 m), weight 65.8 kg (145 lb), SpO2 100 %.  PHYSICAL EXAMINATION:  GENERAL:  82 y.o.-year-old patient lying in the bed with no acute distress.  EYES: Pupils  equal, round, reactive to light and accommodation. No scleral icterus. Extraocular muscles intact.  HEENT: Head atraumatic, normocephalic. Oropharynx and nasopharynx clear.  NECK:  Supple, no jugular venous distention. No thyroid enlargement, no tenderness.  LUNGS: Normal breath sounds bilaterally, no wheezing, rales,rhonchi or crepitation. No use of accessory muscles of respiration.  CARDIOVASCULAR: S1, S2 normal. No murmurs, rubs, or gallops.  ABDOMEN: Soft, nontender, nondistended. Bowel sounds present. No organomegaly or mass.  EXTREMITIES: No pedal edema, cyanosis, or clubbing.  NEUROLOGIC:Unable to assess due to dementia. She is moving all her extremities except the left hip PSYCHIATRIC: The patient is alert and pleasantly confused SKIN: No obvious rash, lesion, or ulcer.   LABORATORY PANEL:   CBC  Recent Labs Lab 06/12/16 0736  WBC 7.2  HGB 12.0  HCT 35.1  PLT 160   ------------------------------------------------------------------------------------------------------------------  Chemistries   Recent Labs Lab 06/12/16 0736  NA 140  K 4.3  CL 108  CO2 25  GLUCOSE 105*  BUN 37*  CREATININE 1.22*  CALCIUM 9.4  AST 19  ALT 9*  ALKPHOS 78  BILITOT 0.3   ------------------------------------------------------------------------------------------------------------------  Cardiac Enzymes No results for input(s): TROPONINI in the last 168 hours. ------------------------------------------------------------------------------------------------------------------  RADIOLOGY:  Dg Chest 1 View  Result Date: 06/12/2016 CLINICAL DATA:  82 y/o F; fall this morning with pain on the left side. EXAM: CHEST 1 VIEW COMPARISON:  09/28/2015 chest radiograph FINDINGS: Stable cardiomediastinal silhouette within normal limits. Aortic atherosclerosis with arch calcifications. Clear lungs. No pneumothorax or pleural effusion. Moderate rightward curvature of upper lumbar spine. No acute  fracture identified. IMPRESSION: Clear lungs. No acute osseous abnormality identified. If clinically indicated consider dedicated rib radiographs if there is focal pain. Electronically Signed   By: Mitzi Hansen M.D.   On: 06/12/2016 06:46   Dg Hip Unilat With Pelvis 2-3 Views Left  Result Date: 06/12/2016 CLINICAL DATA:  Fall this morning with left hip pain. EXAM: DG HIP (WITH OR WITHOUT PELVIS) 2-3V LEFT COMPARISON:  None. FINDINGS: Nondisplaced intertrochanteric left hip fracture. Femoral head is seated in the acetabulum. No additional acute fracture. Pubic symphysis and sacroiliac joints are congruent. The bones are under mineralized. Scoliosis and degenerative change in the included lumbar spine. IMPRESSION: Nondisplaced intertrochanteric left hip fracture. Electronically Signed   By: Rubye Oaks M.D.   On: 06/12/2016 06:46    EKG:   Normal sinus rhythm no acute ST elevation or depression IMPRESSION AND PLAN:  Melissa Cooper  is a 82 y.o. female with a known history of Vascular dementia, CKD stage III, hypertension, hypercholesterolemia who is a poor historian comes emergency room after she had an accidental fall at [facility] when she was trying to get out during the nighttime. Patient was found on the floor screaming for help. She was brought to the emergency room was  found to have nondisplaced intertrochanteric left hip fracture.  1. Left hip intertrochanteric fracture status post mechanical fall at nursing home -Admit to medical floor -Patient is at intermediate risk for noncardiac surgery -She does not have any cardiac history per old records and outpatient PCP -EKG does not show acute changes -Continue home meds and okay to proceed for surgery for symptom management -This was discussed with orthopedic dr Joice Loftspoggi  2. DVT prophylaxis per orthopedic after surgery Continue teds and   SCDs for now  3. Hypertension continue metoprolol  4. Chronic anemia continue iron  supplement  5. Vascular dementia  All the records are reviewed and case discussed with ED provider. Management plans discussed with the patient, family and they are in agreement.  CODE STATUS: DNR (has the out of facility DNR form)  TOTAL TIME TAKING CARE OF THIS PATIENT: 45 minutes.    Braxon Suder M.D on 06/12/2016 at 10:53 AM  Between 7am to 6pm - Pager - [phone]  After 6pm go to [email or web address] - password [email or web address] [facility]  [facility]  Office  [phone]  CC: Primary care physician; Tillman Abideichard Letvak, MD

## 2016-06-12 NOTE — Progress Notes (Signed)
LCSW met with patient and family and completed assessment and Fl2 started ( including Passr #). Patient has excellent family support and is able to return to [facility] upon discharge.  [employer] LCSW [phone]

## 2016-06-12 NOTE — ED Notes (Signed)
Per XRay, pt has hip fracture.  Per XRay request, 1 view CXR ordered.  EDP notified.

## 2016-06-12 NOTE — Progress Notes (Signed)
Surgery consent and blood consent witnessed and signed by patients son (Melissa Cooper) due to patients dementia. The patients sons are both Power of CelesteAttorney. Patients IV saline locked. OR staff has transferred the patient for surgery.

## 2016-06-13 LAB — CBC WITH DIFFERENTIAL/PLATELET
BASOS PCT: 0 %
Basophils Absolute: 0 10*3/uL (ref 0–0.1)
Eosinophils Absolute: 0 10*3/uL (ref 0–0.7)
Eosinophils Relative: 0 %
HEMATOCRIT: 27.2 % — AB (ref 35.0–47.0)
Hemoglobin: 9.2 g/dL — ABNORMAL LOW (ref 12.0–16.0)
Lymphocytes Relative: 16 %
Lymphs Abs: 1.2 10*3/uL (ref 1.0–3.6)
MCH: 29.5 pg (ref 26.0–34.0)
MCHC: 33.7 g/dL (ref 32.0–36.0)
MCV: 87.6 fL (ref 80.0–100.0)
MONO ABS: 0.9 10*3/uL (ref 0.2–0.9)
MONOS PCT: 13 %
NEUTROS ABS: 5.2 10*3/uL (ref 1.4–6.5)
Neutrophils Relative %: 71 %
Platelets: 146 10*3/uL — ABNORMAL LOW (ref 150–440)
RBC: 3.1 MIL/uL — ABNORMAL LOW (ref 3.80–5.20)
RDW: 14.1 % (ref 11.5–14.5)
WBC: 7.4 10*3/uL (ref 3.6–11.0)

## 2016-06-13 LAB — BASIC METABOLIC PANEL
Anion gap: 4 — ABNORMAL LOW (ref 5–15)
BUN: 32 mg/dL — AB (ref 6–20)
CALCIUM: 8.4 mg/dL — AB (ref 8.9–10.3)
CO2: 24 mmol/L (ref 22–32)
CREATININE: 1.12 mg/dL — AB (ref 0.44–1.00)
Chloride: 112 mmol/L — ABNORMAL HIGH (ref 101–111)
GFR calc non Af Amer: 40 mL/min — ABNORMAL LOW (ref >60)
GFR, EST AFRICAN AMERICAN: 47 mL/min — AB (ref >60)
GLUCOSE: 128 mg/dL — AB (ref 65–99)
Potassium: 4.6 mmol/L (ref 3.5–5.1)
Sodium: 140 mmol/L (ref 135–145)

## 2016-06-13 MED ORDER — MORPHINE SULFATE (PF) 2 MG/ML IV SOLN
0.5000 mg | INTRAVENOUS | Status: DC | PRN
  Administered 2016-06-13: 0.5 mg via INTRAVENOUS
  Filled 2016-06-13: qty 1

## 2016-06-13 MED ORDER — NYSTATIN 100000 UNIT/GM EX POWD
Freq: Two times a day (BID) | CUTANEOUS | Status: DC
  Administered 2016-06-13 – 2016-06-15 (×5): via TOPICAL
  Filled 2016-06-13: qty 15

## 2016-06-13 NOTE — Progress Notes (Signed)
[facility] - [place] at [facility]   PATIENT NAME: Melissa Cooper    MR#:  [id number]  DATE OF BIRTH:  05/14/1918  SUBJECTIVE:   POD #1  Awake and alert. Does not communicate much. Required some pain meds y'day. REVIEW OF SYSTEMS:   Review of Systems  Unable to perform ROS: Dementia   Tolerating Diet:some CLD Tolerating PT: pending  DRUG ALLERGIES:   Allergies  Allergen Reactions  . Actonel [Risedronate Sodium] Other (See Comments)    Unknown reaction  . Ceftin [Cefuroxime Axetil] Other (See Comments)    Unknown reaction  . Evista [Raloxifene] Other (See Comments)    Unknown reaction  . Miacalcin [Calcitonin (Salmon)] Other (See Comments)    Unknown reaction  . Zyrtec [Cetirizine] Other (See Comments)    Unknown reaction    VITALS:  Blood pressure 127/60, pulse 94, temperature 98.6 F (37 C), resp. rate 19, height 5' (1.524 m), weight 57.9 kg (127 lb 11.2 oz), SpO2 98 %.  PHYSICAL EXAMINATION:   Physical Exam  GENERAL:  82 y.o.-year-old patient lying in the bed with no acute distress.  EYES: Pupils equal, round, reactive to light and accommodation. No scleral icterus. Extraocular muscles intact.  HEENT: Head atraumatic, normocephalic. Oropharynx and nasopharynx clear.  NECK:  Supple, no jugular venous distention. No thyroid enlargement, no tenderness.  LUNGS: Normal breath sounds bilaterally, no wheezing, rales, rhonchi. No use of accessory muscles of respiration.  CARDIOVASCULAR: S1, S2 normal. No murmurs, rubs, or gallops.  ABDOMEN: Soft, nontender, nondistended. Bowel sounds present. No organomegaly or mass.  EXTREMITIES: No cyanosis, clubbing or edema b/l.  Surgical dressing+  NEUROLOGIC: unable to assess due to dementia but appears grossly nonfocal  PSYCHIATRIC:  patient is alert  SKIN: No obvious rash, lesion, or ulcer.   LABORATORY PANEL:  CBC  Recent Labs Lab 06/13/16 0432  WBC 7.4  HGB 9.2*  HCT 27.2*  PLT 146*     Chemistries   Recent Labs Lab 06/12/16 0736 06/13/16 0432  NA 140 140  K 4.3 4.6  CL 108 112*  CO2 25 24  GLUCOSE 105* 128*  BUN 37* 32*  CREATININE 1.22* 1.12*  CALCIUM 9.4 8.4*  AST 19  --   ALT 9*  --   ALKPHOS 78  --   BILITOT 0.3  --    Cardiac Enzymes No results for input(s): TROPONINI in the last 168 hours. RADIOLOGY:  Dg Chest 1 View  Result Date: 06/12/2016 CLINICAL DATA:  82 y/o F; fall this morning with pain on the left side. EXAM: CHEST 1 VIEW COMPARISON:  09/28/2015 chest radiograph FINDINGS: Stable cardiomediastinal silhouette within normal limits. Aortic atherosclerosis with arch calcifications. Clear lungs. No pneumothorax or pleural effusion. Moderate rightward curvature of upper lumbar spine. No acute fracture identified. IMPRESSION: Clear lungs. No acute osseous abnormality identified. If clinically indicated consider dedicated rib radiographs if there is focal pain. Electronically Signed   By: Mitzi HansenLance  Furusawa-Stratton M.D.   On: 06/12/2016 06:46   Dg Hip Operative Unilat W Or W/o Pelvis Left  Result Date: 06/12/2016 CLINICAL DATA:  Left hip fracture fixation EXAM: OPERATIVE left HIP (WITH PELVIS IF PERFORMED) VIEWS TECHNIQUE: Fluoroscopic spot image(s) were submitted for interpretation post-operatively. COMPARISON:  06/12/2016 FINDINGS: Compression screw and intra medullary rod in the left femur in satisfactory position and alignment. Fracture alignment satisfactory. IMPRESSION: Satisfactory left intertrochanteric fracture fixation. Electronically Signed   By: Marlan Palauharles  Carapia M.D.   On: 06/12/2016 18:59   Dg Hip Unilat  With Pelvis 2-3 Views Left  Result Date: 06/12/2016 CLINICAL DATA:  Fall this morning with left hip pain. EXAM: DG HIP (WITH OR WITHOUT PELVIS) 2-3V LEFT COMPARISON:  None. FINDINGS: Nondisplaced intertrochanteric left hip fracture. Femoral head is seated in the acetabulum. No additional acute fracture. Pubic symphysis and sacroiliac joints  are congruent. The bones are under mineralized. Scoliosis and degenerative change in the included lumbar spine. IMPRESSION: Nondisplaced intertrochanteric left hip fracture. Electronically Signed   By: Rubye Oaks M.D.   On: 06/12/2016 06:46   ASSESSMENT AND PLAN:  Karolina Zamor  is a 82 y.o. female with a known history of Vascular dementia, CKD stage III, hypertension, hypercholesterolemia who is a poor historian comes emergency room after she had an accidental fall at [facility] when she was trying to get out during the nighttime. Patient was found on the floor screaming for help. She was brought to the emergency room was found to have nondisplaced intertrochanteric left hip fracture.  1. Left hip intertrochanteric fracture status post mechanical fall at nursing home -POD #! -did well at surgery -prn pain meds and PT as tolerated. Pt is total care at [facility]  2. DVT prophylaxis per orthopedic after surgery Continue teds and SCDs  -lovenox SQ daily  3. Hypertension continue metoprolol  4. Chronic anemia continue iron supplement  5. Vascular dementia  CSW for d/c planning  Case discussed with Care Management/Social Worker. Management plans discussed with the patient, family and they are in agreement.  CODE STATUS: DNR (has out of facility form) DVT Prophylaxis: lovenox  TOTAL TIME TAKING CARE OF THIS PATIENT:25 minutes.  >50% time spent on counselling and coordination of care  POSSIBLE D/C IN 1-2 DAYS, DEPENDING ON CLINICAL CONDITION.  Note: This dictation was prepared with Dragon dictation along with smaller phrase technology. Any transcriptional errors that result from this process are unintentional.  Nala Kachel M.D on 06/13/2016 at 7:31 AM  Between 7am to 6pm - Pager - [phone]  After 6pm go to [email or web address] - password [email or web address] [facility]  [place] [facility]  Office  [phone]  CC: Primary care physician; Tillman Abide, MD

## 2016-06-13 NOTE — Progress Notes (Signed)
Patient is still very sleepy and cannot take medications or eat related to sedation. Makes small noises and will not open eyes. Pupils respond to light and patient resists opening eyes and resists movement of legs. VS stable. PA updated. PT has not been able to work with patient related to sedation and will see her tomorrow. Discontinue Foley Catheter tomorrow after patient sees PT. Will continue to monitor.

## 2016-06-13 NOTE — Care Management Important Message (Signed)
Important Message  Patient Details  Name: Melissa Cooper MRN: [id number] Date of Birth: 04/18/1918   Medicare Important Message Given:  Yes    Semiah Konczal A, RN 06/13/2016, 2:20 PM

## 2016-06-13 NOTE — Progress Notes (Signed)
PT Cancellation Note  Patient Details Name: Melissa Cooper MRN: [id number] DOB: 05/07/1918   Cancelled Treatment:    Reason Eval/Treat Not Completed: Other (comment).  Pt very difficult to arouse with minimal command following and does not open her eyes.  Nursing present and assessed VS (stable).  Pt not able to to participate in PT eval at this time.  Will re-attempt PT eval at a later date/time when pt able to participate in PT session.   [name] 06/13/2016, 12:39 PM [name], PT [phone]

## 2016-06-13 NOTE — Progress Notes (Signed)
PT Cancellation Note  Patient Details Name: Melissa Cooper L Forst MRN: [id number] DOB: 04/28/1918   Cancelled Treatment:    Reason Eval/Treat Not Completed: Other (comment).  Per discussion with nursing, will hold PT at this time (pt does not appear to be able to actively participate in PT).  Will re-attempt PT eval at a later date/time as medically appropriate.   Irving Burtonmily Jennel Mara 06/13/2016, 4:02 PM Hendricks LimesEmily Ean Gettel, PT [phone]

## 2016-06-13 NOTE — Progress Notes (Signed)
  Subjective: 1 Day Post-Op Procedure(s) (LRB): INTRAMEDULLARY (IM) NAIL INTERTROCHANTRIC (Left) Patient is unable to provide an accurate pain score. Patient is well, and has had no acute complaints or problems Plan is to go Skilled nursing facility after hospital stay. Negative for chest pain and shortness of breath Fever: no Gastrointestinal:Negative for nausea and vomiting  Objective: Vital signs in last 24 hours: Temp:  [97.5 F (36.4 C)-98.7 F (37.1 C)] 98.4 F (36.9 C) (10/15 0732) Pulse Rate:  [86-94] 93 (10/15 0732) Resp:  [18-25] 20 (10/15 0732) BP: (93-146)/(51-97) 118/51 (10/15 0732) SpO2:  [92 %-100 %] 99 % (10/15 0732)  Intake/Output from previous day:  Intake/Output Summary (Last 24 hours) at 06/13/16 1030 Last data filed at 06/13/16 0551  Gross per 24 hour  Intake             1550 ml  Output              755 ml  Net              795 ml    Intake/Output this shift: No intake/output data recorded.  Labs:  Recent Labs  06/12/16 0736 06/13/16 0432  HGB 12.0 9.2*    Recent Labs  06/12/16 0736 06/13/16 0432  WBC 7.2 7.4  RBC 4.05 3.10*  HCT 35.1 27.2*  PLT 160 146*    Recent Labs  06/12/16 0736 06/13/16 0432  NA 140 140  K 4.3 4.6  CL 108 112*  CO2 25 24  BUN 37* 32*  CREATININE 1.22* 1.12*  GLUCOSE 105* 128*  CALCIUM 9.4 8.4*    Recent Labs  06/12/16 0736  INR 1.01     EXAM General - Patient is Disorganized, Lacking and unable to communicate this AM. Extremity - ABD soft Intact pulses distally Dorsiflexion/Plantar flexion intact Incision: moderate drainage Dressing/Incision - blood tinged drainage to the most proximal incision, no signs of infection. Motor Function - intact, moving foot and toes well on exam.  Past Medical History:  Diagnosis Date  . CKD (chronic kidney disease)    stage 3  . Dementia   . HTN (hypertension)   . Hypercholesteremia   . OSA (obstructive sleep apnea)   . Osteoarthritis   .  Osteoarthritis   . PA (pernicious anemia)     Assessment/Plan: 1 Day Post-Op Procedure(s) (LRB): INTRAMEDULLARY (IM) NAIL INTERTROCHANTRIC (Left) Active Problems:   Hip fracture (HCC)  Estimated body mass index is 24.94 kg/m as calculated from the following:   Height as of this encounter: 5' (1.524 m).   Weight as of this encounter: 57.9 kg (127 lb 11.2 oz). Advance diet Up with therapy   Labs reviewed, Hg 9.2 this AM, moderate drainage to proximal incision, will order CBC for tomorrow morning, may need transfusion. BMP ordered for tomorrow morning. Up with therapy. Prior to admission, [facility]  Family member at bedside notes on slight change in cognitive function this AM.  DVT Prophylaxis - Lovenox, Foot Pumps and TED hose Weight-Bearing as tolerated to left leg  J. Horris LatinoLance Roshan Roback, PA-C [facility] 06/13/2016, 10:30 AM

## 2016-06-13 NOTE — Progress Notes (Signed)
Patient is very sleepy and difficult to arouse. Patient will squeeze my hand when asked but will not open her eyes. Received 10 mg oxy at 0902. VS stable. Pulses 2+. Placed nasal cannula in the mouth because patient is a mouth breather. Possibly oversedated. Will continue to monitor.

## 2016-06-13 NOTE — Progress Notes (Addendum)
Initial Nutrition Assessment  DOCUMENTATION CODES:   Not applicable  INTERVENTION:  Continue Ensure Enlive po BID, each supplement provides 350 kcal and 20 grams of protein Continue Boost Breeze po TID, each supplement provides 250 kcal and 9 grams of protein   NUTRITION DIAGNOSIS:   Increased nutrient needs related to acute illness, wound healing as evidenced by estimated needs.  GOAL:   Patient will meet greater than or equal to 90% of their needs  MONITOR:   PO intake, I & O's, Labs, Weight trends  REASON FOR ASSESSMENT:   Malnutrition Screening Tool    ASSESSMENT:   Melissa Cooper  is a 82 y.o. female with a known history of Vascular dementia, CKD stage III, hypertension, hypercholesterolemia who is a poor historian comes emergency room after she [facility] an [facility] [place] when she was trying to get out during the nighttime Presented with hip fracture, s/p fix  Spoke with Melissa Cooper's son at bedside. He states pt has lost 20# over the past year while at [facility]. Per chart review she exhibits a 23#/15% insignificant wt loss over 10 months. States she ate 100% this morning. Son denies any concerns with patient's chewing/swallowing/choking. Nutrition-Focused physical exam completed. Findings are mod fat depletion, mod muscle depletion, and no edema.  Continue Boost/Ensure Labs and medications reviewed: Iron, Colace, B12 D5 w/ K+ @ 6975mL/hr --> 306 calories  Diet Order:  DIET SOFT Room service appropriate? Yes; Fluid consistency: Thin  Skin:  Reviewed, no issues  Last BM:  PTA  Height:   Ht Readings from Last 1 Encounters:  06/12/16 5' (1.524 m)    Weight:   Wt Readings from Last 1 Encounters:  06/12/16 127 lb 11.2 oz (57.9 kg)    Ideal Body Weight:  45.45 kg  BMI:  Body mass index is 24.94 kg/m.  Estimated Nutritional Needs:   Kcal:  1200-1450 calories  Protein:  87-100 gm  Fluid:  >/= 1.2L  EDUCATION NEEDS:   No education needs  identified at this time  Dionne AnoWilliam M. Mendy Chou, MS, RD LDN Inpatient Clinical Dietitian Pager [phone]

## 2016-06-14 ENCOUNTER — Encounter: Payer: Self-pay | Admitting: Surgery

## 2016-06-14 ENCOUNTER — Inpatient Hospital Stay: Payer: Medicare Other

## 2016-06-14 ENCOUNTER — Telehealth: Payer: Self-pay

## 2016-06-14 LAB — BASIC METABOLIC PANEL
ANION GAP: 3 — AB (ref 5–15)
BUN: 29 mg/dL — ABNORMAL HIGH (ref 6–20)
CHLORIDE: 115 mmol/L — AB (ref 101–111)
CO2: 24 mmol/L (ref 22–32)
Calcium: 8.5 mg/dL — ABNORMAL LOW (ref 8.9–10.3)
Creatinine, Ser: 1.24 mg/dL — ABNORMAL HIGH (ref 0.44–1.00)
GFR calc Af Amer: 41 mL/min — ABNORMAL LOW (ref >60)
GFR calc non Af Amer: 36 mL/min — ABNORMAL LOW (ref >60)
GLUCOSE: 112 mg/dL — AB (ref 65–99)
POTASSIUM: 4.2 mmol/L (ref 3.5–5.1)
Sodium: 142 mmol/L (ref 135–145)

## 2016-06-14 LAB — CBC
HCT: 24.5 % — ABNORMAL LOW (ref 35.0–47.0)
Hemoglobin: 8.2 g/dL — ABNORMAL LOW (ref 12.0–16.0)
MCH: 29.7 pg (ref 26.0–34.0)
MCHC: 33.6 g/dL (ref 32.0–36.0)
MCV: 88.2 fL (ref 80.0–100.0)
Platelets: 117 10*3/uL — ABNORMAL LOW (ref 150–440)
RBC: 2.78 MIL/uL — ABNORMAL LOW (ref 3.80–5.20)
RDW: 14.3 % (ref 11.5–14.5)
WBC: 6.5 10*3/uL (ref 3.6–11.0)

## 2016-06-14 LAB — HEMOGLOBIN: Hemoglobin: 8 g/dL — ABNORMAL LOW (ref 12.0–16.0)

## 2016-06-14 MED ORDER — ORAL CARE MOUTH RINSE
15.0000 mL | Freq: Two times a day (BID) | OROMUCOSAL | Status: DC
  Administered 2016-06-14 – 2016-06-15 (×3): 15 mL via OROMUCOSAL

## 2016-06-14 MED ORDER — MUPIROCIN 2 % EX OINT
1.0000 "application " | TOPICAL_OINTMENT | Freq: Two times a day (BID) | CUTANEOUS | Status: DC
  Administered 2016-06-14 – 2016-06-15 (×3): 1 via NASAL
  Filled 2016-06-14: qty 22

## 2016-06-14 MED ORDER — SODIUM CHLORIDE 0.9 % IV BOLUS (SEPSIS)
500.0000 mL | Freq: Once | INTRAVENOUS | Status: AC
  Administered 2016-06-14: 500 mL via INTRAVENOUS

## 2016-06-14 MED ORDER — ACETAMINOPHEN 325 MG PO TABS: 650.0000 mg | ORAL_TABLET | Freq: Four times a day (QID) | ORAL | Status: DC | PRN

## 2016-06-14 MED ORDER — TRAMADOL HCL 50 MG PO TABS
50.0000 mg | ORAL_TABLET | Freq: Four times a day (QID) | ORAL | Status: DC | PRN
  Administered 2016-06-14: 50 mg via ORAL
  Filled 2016-06-14: qty 1

## 2016-06-14 MED ORDER — ACETAMINOPHEN 650 MG RE SUPP: 650.0000 mg | Freq: Four times a day (QID) | RECTAL | Status: DC | PRN

## 2016-06-14 MED ORDER — CHLORHEXIDINE GLUCONATE CLOTH 2 % EX PADS: 6.0000 | MEDICATED_PAD | Freq: Every day | CUTANEOUS | Status: DC

## 2016-06-14 MED ORDER — METOPROLOL TARTRATE 25 MG PO TABS
12.5000 mg | ORAL_TABLET | Freq: Two times a day (BID) | ORAL | Status: DC
  Administered 2016-06-14 – 2016-06-15 (×3): 12.5 mg via ORAL
  Filled 2016-06-14 (×4): qty 1

## 2016-06-14 NOTE — Evaluation (Signed)
Physical Therapy Evaluation Patient Details Name: Melissa Cooper MRN: [id number] DOB: 02/06/1918 Today's Date: 06/14/2016   History of Present Illness  Pt is a 82 y/o female s/p L IM intertrochanteric nail 06/12/16. Pt was presenting with decreased alertness (not responding to sternal rub/painful stimuli though all vitals checked and WFL's) and AMS post surgery and CT scan was taken 06/14/16 with no acute abnormalities. PMH of CKD, dementia, HTN, HLD, arthritis, and chronic anemia.  Clinical Impression  Pt lives at [facility] and at baseline is dependent for most mobility and ADL's. Pt son reports that pt can feed herself and 1 week ago was seen completing squat-pivot transfer with + 1 assist. At this time pt is lethargic and only becomes more alert and participatory with sitting EOB. Pt son reports pt baseline is more participatory and communicative. Pt demonstrates generalized weakness in B UE 's and LE's with more functional activities. Pt unable to participate with L hip AROM and reports pain with L hip movement. Pt required +2 total assist to transfer supine<>sit and +1 max to sit EOB and pt fatigues easily with this activity. O2 97% or greater throughout session on 3 L of O2. Pt will benefit from continued PT in order to improve activity tolerance and participation to baseline function.     Follow Up Recommendations Home health PT;Supervision/Assistance - 24 hour    Equipment Recommendations   (Pt has WC at SNF)    Recommendations for Other Services       Precautions / Restrictions Precautions Precautions: Fall Restrictions Weight Bearing Restrictions: Yes LLE Weight Bearing: Weight bearing as tolerated      Mobility  Bed Mobility Overal bed mobility: Needs Assistance;+2 for physical assistance Bed Mobility: Supine to Sit;Sit to Supine     Supine to sit: Max assist;+2 for physical assistance Sit to supine: Max assist;+2 for physical assistance   General bed mobility  comments: Pt is total assist to complete bed transfers and +1 max assist to maintain sitting EOB for approximately 5 minutes with intermittent +1 min assist if pt pulling self with R UE on bedrail (approximately x3 trials)  Transfers                 General transfer comment: Pt unable at this time  Ambulation/Gait             General Gait Details: Pt unable at this time  Stairs            Wheelchair Mobility    Modified Rankin (Stroke Patients Only)       Balance Overall balance assessment: Needs assistance   Sitting balance-Leahy Scale: Poor Sitting balance - Comments: +1 max assist to maintain sitting                                     Pertinent Vitals/Pain Pain Assessment: Faces Faces Pain Scale: Hurts even more (Pt unable to verbalize pain rating; subjective assessment based on pt facial expressions during activity) Pain Location: Pt unable to state excact location (painful expression with hand and L LE mobility) Pain Descriptors / Indicators:  (Unable to describe) Pain Intervention(s): Limited activity within patient's tolerance;Monitored during session;Repositioned  HR between 105 and 108 bpm throughout session.     Home Living Family/patient expects to be discharged to:: Skilled nursing facility  Additional Comments: Pt unable to participate in discharge/PLOF discussion; pt oldest son in room throughout session to answer all questions    Prior Function Level of Independence: Needs assistance   Gait / Transfers Assistance Needed: Pt primarily transfered bed to [facility] via hoyer lift; son reports seeing pt transfered via squat-pivot to WC 1 week ago  ADL's / Homemaking Assistance Needed: Total assist except patient feeds herself        Hand Dominance        Extremity/Trunk Assessment   Upper Extremity Assessment: Generalized weakness (PROM B shoulder flexion grossly to 100 degrees; at least 3/5 B elbow  flexion AROM)           Lower Extremity Assessment: Generalized weakness (Pt able to complete partial AROM DF bilaterally (at least 2-/5); PROM B knee flexion WFL's, pt unable to participate with B hip flexion AROM)         Communication   Communication: No difficulties  Cognition Arousal/Alertness: Lethargic (Supine pt required max verbal/tactile cues and would briefly and occasionally open eyes; pt in sitting alert with eyes maintained open) Behavior During Therapy: WFL for tasks assessed/performed Overall Cognitive Status: History of cognitive impairments - at baseline (Pt sons report that she is more lethargic than her baseline)                      General Comments General comments (skin integrity, edema, etc.): Pt initially had decreased alertness and participation. Pt son agreeable to PT session and sitting pt EOB. Pt alertness and participation increased with sitting EOB.    Exercises     Assessment/Plan    PT Assessment Patient needs continued PT services  PT Problem List Decreased strength;Decreased range of motion;Decreased activity tolerance;Decreased balance;Decreased mobility;Pain          PT Treatment Interventions Functional mobility training;Therapeutic activities;Therapeutic exercise;Balance training;Patient/family education    PT Goals (Current goals can be found in the Care Plan section)  Acute Rehab PT Goals Patient Stated Goal: Pt unable to participate in goal setting PT Goal Formulation: Patient unable to participate in goal setting    Frequency 7X/week   Barriers to discharge        Co-evaluation               End of Session Equipment Utilized During Treatment: Oxygen Activity Tolerance: Patient limited by pain;Patient limited by fatigue;Patient limited by lethargy Patient left: in bed;with call bell/phone within reach;with bed alarm set;with family/visitor present (B heels suspended on pillow) Nurse Communication: Mobility  status;Precautions         Time: 8469-6295 PT Time Calculation (min) (ACUTE ONLY): 31 min   Charges:         PT G Codes:        Breelle Hollywood, SPT 06/14/2016, 4:45 PM

## 2016-06-14 NOTE — Progress Notes (Signed)
  Subjective: 2 Days Post-Op Procedure(s) (LRB): INTRAMEDULLARY (IM) NAIL INTERTROCHANTRIC (Left) Patient is unable to provide an accurate pain score. Patient is well, and has had no acute complaints or problems Plan is to go Skilled nursing facility after hospital stay. Negative for chest pain and shortness of breath Fever: no Gastrointestinal:Negative for nausea and vomiting  Objective: Vital signs in last 24 hours: Temp:  [97.9 F (36.6 C)-99.5 F (37.5 C)] 98.3 F (36.8 C) (10/16 0522) Pulse Rate:  [97-117] 113 (10/16 0522) Resp:  [16-25] 16 (10/16 0522) BP: (101-152)/(47-130) 102/65 (10/16 0522) SpO2:  [92 %-100 %] 94 % (10/16 0522)  Intake/Output from previous day:  Intake/Output Summary (Last 24 hours) at 06/14/16 0733 Last data filed at 06/14/16 0428  Gross per 24 hour  Intake           1907.5 ml  Output              850 ml  Net           1057.5 ml    Intake/Output this shift: No intake/output data recorded.  Labs:  Recent Labs  06/12/16 0736 06/13/16 0432 06/14/16 0416  HGB 12.0 9.2* 8.2*    Recent Labs  06/13/16 0432 06/14/16 0416  WBC 7.4 6.5  RBC 3.10* 2.78*  HCT 27.2* 24.5*  PLT 146* 117*    Recent Labs  06/13/16 0432 06/14/16 0416  NA 140 142  K 4.6 4.2  CL 112* 115*  CO2 24 24  BUN 32* 29*  CREATININE 1.12* 1.24*  GLUCOSE 128* 112*  CALCIUM 8.4* 8.5*    Recent Labs  06/12/16 0736  INR 1.01     EXAM General - Patient is Disorganized, Lacking and unable to communicate this AM. Extremity - ABD soft Intact pulses distally Dorsiflexion/Plantar flexion intact Incision: scant drainage Dressing/Incision - blood tinged drainage to the most proximal incision, no signs of infection. Motor Function - intact, moving foot and toes well on exam.  Past Medical History:  Diagnosis Date  . CKD (chronic kidney disease)    stage 3  . Dementia   . HTN (hypertension)   . Hypercholesteremia   . OSA (obstructive sleep apnea)   .  Osteoarthritis   . Osteoarthritis   . PA (pernicious anemia)     Assessment/Plan: 2 Days Post-Op Procedure(s) (LRB): INTRAMEDULLARY (IM) NAIL INTERTROCHANTRIC (Left) Active Problems:   Hip fracture (HCC)  Estimated body mass index is 24.94 kg/m as calculated from the following:   Height as of this encounter: 5' (1.524 m).   Weight as of this encounter: 57.9 kg (127 lb 11.2 oz). Advance diet Up with therapy   Labs reviewed, Hg 8.2 this AM, will place order for afternoon Hg, may need transfusion. Pt is lacking this AM, may be related to pain medication, instructed nurse yesterday to stick primarily with tramadol and non-narcotics. CBC and BMP ordered for tomorrow morning. Prior to admission, patient was [facility]   DVT Prophylaxis - Lovenox, Foot Pumps and TED hose Weight-Bearing as tolerated to left leg  J. Horris LatinoLance Devyne Hauger, PA-C [facility] 06/14/2016, 7:33 AM

## 2016-06-14 NOTE — Telephone Encounter (Signed)
Pt currently admitted to [facility]. 

## 2016-06-14 NOTE — Progress Notes (Signed)
PT Cancellation Note  Patient Details Name: Melissa Cooper MRN: [id number] DOB: 10/08/1917   Cancelled Treatment:    Reason Eval/Treat Not Completed: Other (comment) Pt is off floor for imaging upon PT arrival. PT will return at a later time/date to complete evaluation as patient medically appropriate.    Albertina SenegalMeagan Keryn Nessler, SPT 06/14/2016, 10:43 AM

## 2016-06-14 NOTE — Telephone Encounter (Signed)
Does have hip fracture [facility]

## 2016-06-14 NOTE — Progress Notes (Addendum)
Patient is now alert and oriented to person and had agreed to take BP and pain medication. She has also eaten a bowl of soup which is her first meal for today. Family still at bedside.   Harvie HeckMelanie Makiya Jeune, RN

## 2016-06-14 NOTE — Care Management (Signed)
RNCM consult received and will continue to follow. Patient is from [facility] and they have agreed to take patient back. CSW will assist with discharge planning. PT evaluation pending.

## 2016-06-14 NOTE — Progress Notes (Signed)
CSW discussed patient with MD. Updated Sue LushAndrea- Admissions at [facility]. Possible discharge tomorrow 06/15/16. CSW will continue to follow and assist.  Woodroe Modehristina Orli Degrave, MSW, LCSW, LCAS-A Clinical Social Worker [phone]

## 2016-06-14 NOTE — Progress Notes (Signed)
[facility] [place] at [facility]   PATIENT NAME: Melissa Cooper    MR#:  [id number]  DATE OF BIRTH:  05/13/1918  SUBJECTIVE:   POD # 2  Awake and said good morning when addressed . Does not communicate much. Required some pain meds y'day. Drank few spoonfuls of water REVIEW OF SYSTEMS:   Review of Systems  Unable to perform ROS: Dementia   Tolerating Diet:some po soft diet Tolerating PT: pending  DRUG ALLERGIES:   Allergies  Allergen Reactions  . Actonel [Risedronate Sodium] Other (See Comments)    Unknown reaction  . Ceftin [Cefuroxime Axetil] Other (See Comments)    Unknown reaction  . Evista [Raloxifene] Other (See Comments)    Unknown reaction  . Miacalcin [Calcitonin ([place])] Other (See Comments)    Unknown reaction  . Zyrtec [Cetirizine] Other (See Comments)    Unknown reaction    VITALS:  Blood pressure (!) 131/51, pulse (!) 110, temperature 98.9 F (37.2 C), temperature source Axillary, resp. rate 18, height 5' (1.524 m), weight 57.9 kg (127 lb 11.2 oz), SpO2 91 %.  PHYSICAL EXAMINATION:   Physical Exam  GENERAL:  82 y.o.-year-old patient lying in the bed with no acute distress.  EYES: Pupils equal, round, reactive to light and accommodation. No scleral icterus. Extraocular muscles intact.  HEENT: Head atraumatic, normocephalic. Oropharynx and nasopharynx clear.  NECK:  Supple, no jugular venous distention. No thyroid enlargement, no tenderness.  LUNGS: Normal breath sounds bilaterally, no wheezing, rales, rhonchi. No use of accessory muscles of respiration.  CARDIOVASCULAR: S1, S2 normal. No murmurs, rubs, or gallops.  ABDOMEN: Soft, nontender, nondistended. Bowel sounds present. No organomegaly or mass.  EXTREMITIES: No cyanosis, clubbing or edema b/l.  Surgical dressing+  NEUROLOGIC: unable to assess due to dementia but appears grossly nonfocal  PSYCHIATRIC:  patient is alert but does not talk much SKIN: No obvious rash,  lesion, or ulcer.   LABORATORY PANEL:  CBC  Recent Labs Lab 06/14/16 0416  WBC 6.5  HGB 8.2*  HCT 24.5*  PLT 117*    Chemistries   Recent Labs Lab 06/12/16 0736  06/14/16 0416  NA 140  < > 142  K 4.3  < > 4.2  CL 108  < > 115*  CO2 25  < > 24  GLUCOSE 105*  < > 112*  BUN 37*  < > 29*  CREATININE 1.22*  < > 1.24*  CALCIUM 9.4  < > 8.5*  AST 19  --   --   ALT 9*  --   --   ALKPHOS 78  --   --   BILITOT 0.3  --   --   < > = values in this interval not displayed. Cardiac Enzymes No results for input(s): TROPONINI in the last 168 hours. RADIOLOGY:  Ct Head Wo Contrast  Result Date: 06/14/2016 CLINICAL DATA:  Altered mental status. Recent fall with hip fracture on 06/12/2016. EXAM: CT HEAD WITHOUT CONTRAST TECHNIQUE: Contiguous axial images were obtained from the base of the skull through the vertex without intravenous contrast. COMPARISON:  CT scan dated 04/24/2014 FINDINGS: Brain: There is no acute intracranial hemorrhage, acute infarction, or intracranial mass lesion. There multiple old lacunar infarcts, most prominent in the right external capsule and the head of the right caudate nucleus. Extensive periventricular chronic white matter disease consistent with small vessel ischemic changes. Diffuse atrophy, most prominent in the temporal lobes. Vascular: Calcification in the carotid siphons and distal vertebral arteries. Skull: Negative.  Sinuses/Orbits: Negative. Other: No visible scalp contusion or hematoma. IMPRESSION: No acute abnormality. Old infarct. Diffuse atrophy and chronic small vessel ischemic changes. Electronically Signed   By: Francene BoyersJames  Maxwell M.D.   On: 06/14/2016 11:23   Dg Hip Operative Unilat W Or W/o Pelvis Left  Result Date: 06/12/2016 CLINICAL DATA:  Left hip fracture fixation EXAM: OPERATIVE left HIP (WITH PELVIS IF PERFORMED) VIEWS TECHNIQUE: Fluoroscopic spot image(s) were submitted for interpretation post-operatively. COMPARISON:  06/12/2016 FINDINGS:  Compression screw and intra medullary rod in the left femur in satisfactory position and alignment. Fracture alignment satisfactory. IMPRESSION: Satisfactory left intertrochanteric fracture fixation. Electronically Signed   By: Marlan Palauharles  Winkles M.D.   On: 06/12/2016 18:59   ASSESSMENT AND PLAN:  Melissa Cooper  is a 82 y.o. female with a known history of Vascular dementia, CKD stage III, hypertension, hypercholesterolemia who is a poor historian comes emergency room after she [facility] when she was trying to get out during the nighttime. Patient was found on the floor screaming for help. She was brought to the emergency room was found to have nondisplaced intertrochanteric left hip fracture.  1. Left hip intertrochanteric fracture status post mechanical fall at nursing home -POD # 2 -did well at surgery -prn pain meds and PT as tolerated. [facility]  2. DVT prophylaxis per orthopedic after surgery Continue teds and SCDs  -lovenox SQ daily  3. Hypertension continue metoprolol  4. Chronic anemia continue iron supplement  5. Vascular dementia. Pt appears not much responsive per RN -CT head shows no acute findings. Atrophy ad previous old infarct   CSW for d/c planning  Case discussed with Care Management/Social Worker. Management plans discussed with the patient, family and they are in agreement.  CODE STATUS: DNR (has out of facility form) DVT Prophylaxis: lovenox  TOTAL TIME TAKING CARE OF THIS PATIENT:25 minutes.  >50% time spent on counselling and coordination of care  POSSIBLE D/C IN 1-2 DAYS, DEPENDING ON CLINICAL CONDITION.  Note: This dictation was prepared with Dragon dictation along with smaller phrase technology. Any transcriptional errors that result from this process are unintentional.  Mala Gibbard M.D on 06/14/2016 at 12:19 PM  Between 7am to 6pm - Pager - [phone]  After 6pm go to [email or web address] - password [email or web address]  [facility]  [facility]  Office  [phone]  CC: Primary care physician; Tillman Abideichard Letvak, MD

## 2016-06-14 NOTE — Progress Notes (Signed)
Patient able to open eye and wake up for a few seconds. Will not take meds or eat/drink anything. Son at bedside and states that this is not her baseline and she normal is awake, follows commands, and talks. MD made aware and discontinued narcotics, ordered CT of head for altered mental status.  Harvie HeckMelanie Alireza Pollack, RN

## 2016-06-14 NOTE — Telephone Encounter (Signed)
PLEASE NOTE: All timestamps contained within this report are represented as [place] Standard Time. CONFIDENTIALTY NOTICE: This fax transmission is intended only for the addressee. It contains information that is legally privileged, confidential or otherwise protected from use or disclosure. If you are not the intended recipient, you are strictly prohibited from reviewing, disclosing, copying using or disseminating any of this information or taking any action in reliance on or regarding this information. If you have received this fax in error, please notify us immediately by telephone so that we can arrange for its return to [place]. Phone: [phone], Toll-Free: [phone], Fax: [phone] Page: 1 of 2 Call Id: [id number] [facility] Night - Client TELEPHONE ADVICE RECORD [facility] Medical Call Center Patient Name: Melissa Cooper Gender: Female DOB: 01-23-1918 Age: 82 Y 10 M 6 D Return Phone Number: [phone] (Primary) Address: City/State/Zip: [place] Client [facility] Night - Client Client Site  [facility] - Night Physician Tillman Abide - MD Contact Type Call Who Is Calling Patient / Member / Family / Caregiver Call Type Triage / Clinical Caller Name Hilda Lias @ [facility] Nursing Relationship To Patient Care Giver Return Phone Number [phone] (Primary) Chief Complaint Hip pain Reason for Call Symptomatic / Request for Health Information Initial Comment CBWN: Still waiting to hear from a nurse. - Resident fell on the floor and complaining of left hip pain PreDisposition InappropriateToAsk Translation No Nurse Assessment Nurse: Dorothyann Gibbs, RN, Melene Plan Date/Time (Eastern Time): 06/12/2016 5:46:57 AM Confirm and document reason for call. If symptomatic, describe symptoms. You must click the next button to save text entered. ---Caller states she is Hilda Lias at [facility] and the resident fell on the floor and  complaining of left hip pain. She could not lift the leg and it looked "flatter" than the other leg. Caller states she needs on call doctor notified. Has the patient traveled out of the country within the last 30 days? ---Not Applicable Does the patient have any new or worsening symptoms? ---Yes Will a triage be completed? ---Yes Related visit to physician within the last 2 weeks? ---N/A Does the PT have any chronic conditions? (i.e. diabetes, asthma, etc.) ---Unknown Is this a behavioral health or substance abuse call? ---No Guidelines Guideline Title Affirmed Question Affirmed Notes Nurse Date/Time (Eastern Time) Hip Injury Looks like a dislocated joint (crooked or deformed) Henard, RN, Kaci 06/12/2016 5:47:37 AM Disp. Time Lamount Cohen Time) Disposition Final User 06/12/2016 5:23:53 AM Send To Call Back Waiting For Nurse Vicki Mallet 06/12/2016 5:53:42 AM Paged On Call back to [facility], RN, Melene Plan PLEASE NOTE: All timestamps contained within this report are represented as [place] Standard Time. CONFIDENTIALTY NOTICE: This fax transmission is intended only for the addressee. It contains information that is legally privileged, confidential or otherwise protected from use or disclosure. If you are not the intended recipient, you are strictly prohibited from reviewing, disclosing, copying using or disseminating any of this information or taking any action in reliance on or regarding this information. If you have received this fax in error, please notify us immediately by telephone so that we can arrange for its return to [place]. Phone: [phone], Toll-Free: [phone], Fax: [phone] Page: 2 of 2 Call Id: [id number] Disp. Time Lamount Cohen Time) Disposition Final User 06/12/2016 5:54:09 AM 911 Outcome Documentation Henard, RN, Melene Plan Reason: Caller states she already sent the patient by ambulance. 06/12/2016 5:56:43 AM Call Completed Henard, RN, Melene Plan 06/12/2016 5:53:06 AM Call EMS  911 Now Yes Henard,  RN, Katherene PontoKaci Caller Understands: Yes Disagree/Comply: Comply Care Advice Given Per Guideline CALL EMS 911 NOW: Immediate medical attention is needed. You need to hang up and call 911 (or an ambulance). Probation officer(Triager Discretion: I'll call you back in a few minutes to be sure you were able to reach them.) CARE ADVICE given per Hip Injury (Adult) guideline. Comments User: Selena LesserKaci, Henard, RN Date/Time Lamount Cohen(Eastern Time): 06/12/2016 5:54:35 AM Caller states the patient's family is aware of her transport. Paging DoctorName Phone DateTime Result/Outcome Message Type Notes Ruthe Mannanron, Talia - MD [phone] 06/12/2016 5:53:42 AM Paged On Call Back to Call Center Doctor Paged Please call Melene PlanKaci, RN with [facility] at [phone] Ruthe MannanAron, Talia - MD 06/12/2016 5:56:35 AM Spoke with On Call - General Message Result Gave report to Dr. Dayton MartesAron and she verbalized understanding.

## 2016-06-15 LAB — CBC
HEMATOCRIT: 22.9 % — AB (ref 35.0–47.0)
HEMOGLOBIN: 7.7 g/dL — AB (ref 12.0–16.0)
MCH: 29.5 pg (ref 26.0–34.0)
MCHC: 33.4 g/dL (ref 32.0–36.0)
MCV: 88.4 fL (ref 80.0–100.0)
Platelets: 105 10*3/uL — ABNORMAL LOW (ref 150–440)
RBC: 2.6 MIL/uL — AB (ref 3.80–5.20)
RDW: 14.2 % (ref 11.5–14.5)
WBC: 4.6 10*3/uL (ref 3.6–11.0)

## 2016-06-15 LAB — BASIC METABOLIC PANEL
ANION GAP: 3 — AB (ref 5–15)
BUN: 22 mg/dL — ABNORMAL HIGH (ref 6–20)
CALCIUM: 8.2 mg/dL — AB (ref 8.9–10.3)
CHLORIDE: 118 mmol/L — AB (ref 101–111)
CO2: 22 mmol/L (ref 22–32)
Creatinine, Ser: 0.99 mg/dL (ref 0.44–1.00)
GFR calc non Af Amer: 47 mL/min — ABNORMAL LOW (ref >60)
GFR, EST AFRICAN AMERICAN: 54 mL/min — AB (ref >60)
Glucose, Bld: 132 mg/dL — ABNORMAL HIGH (ref 65–99)
POTASSIUM: 4.6 mmol/L (ref 3.5–5.1)
Sodium: 143 mmol/L (ref 135–145)

## 2016-06-15 LAB — TYPE AND SCREEN
ABO/RH(D): O NEG
ANTIBODY SCREEN: NEGATIVE

## 2016-06-15 LAB — PREPARE RBC (CROSSMATCH)

## 2016-06-15 MED ORDER — DOCUSATE SODIUM 100 MG PO CAPS: 100.0000 mg | ORAL_CAPSULE | Freq: Two times a day (BID) | ORAL | 0 refills | Status: DC

## 2016-06-15 MED ORDER — ENOXAPARIN SODIUM 30 MG/0.3ML ~~LOC~~ SOLN: 30.0000 mg | SUBCUTANEOUS | 0 refills | Status: DC

## 2016-06-15 MED ORDER — POLYETHYLENE GLYCOL 3350 17 G PO PACK: 17.0000 g | PACK | Freq: Every day | ORAL | 0 refills | Status: DC | PRN

## 2016-06-15 MED ORDER — SODIUM CHLORIDE 0.9 % IV SOLN: Freq: Once | INTRAVENOUS | Status: DC

## 2016-06-15 NOTE — Progress Notes (Signed)
Physical Therapy Treatment Patient Details Name: Melissa Cooper MRN: [id number] DOB: 08/16/1918 Today's Date: 06/15/2016    History of Present Illness Pt is a 82 y/o female s/p L IM intertrochanteric nail 06/12/16. Pt was presenting with decreased alertness (not responding to sternal rub/painful stimuli though all vitals checked and WFL's) and AMS post surgery and CT scan was taken 06/14/16 with no acute abnormalities. PMH of CKD, dementia, HTN, HLD, arthritis, and chronic anemia.    PT Comments    Pt initially eyes closed but responding to all questions. Pt able to follow one step commands with min-mod vc's and tactile cues. Pt required +2 max for all bed mobility but was able to sit supervision for the majority of the session. Pt able to participate in forward weight shift/pre-transfer activities and brushing hair well with min vc's. Pt continue's to demonstrate painful facial expressions with L hip movements that resolve quickly. Pt will benefit from therapeutic exercises and progressing bed mobility and transfers.     Follow Up Recommendations  Home health PT;Supervision/Assistance - 24 hour     Equipment Recommendations   (Pt as WC at SNF)    Recommendations for Other Services       Precautions / Restrictions Precautions Precautions: Fall Restrictions Weight Bearing Restrictions: Yes LLE Weight Bearing: Weight bearing as tolerated    Mobility  Bed Mobility Overal bed mobility: Needs Assistance;+2 for physical assistance Bed Mobility: Rolling;Supine to Sit;Sit to Supine Rolling: Max assist (place B knees in flexion; pt able to initiate reaching with mod vc's)   Supine to sit: Max assist;+2 for safety/equipment Sit to supine: Max assist;+2 for physical assistance   General bed mobility comments: Pt max assist for all bed mobility, however able to participate and able to sit EOB for most of session supervision  Transfers                 General transfer comment:  Pt unable at this time  Ambulation/Gait                 Stairs            Wheelchair Mobility    Modified Rankin (Stroke Patients Only)       Balance Overall balance assessment: Needs assistance Sitting-balance support: Single extremity supported;Feet supported Sitting balance-Leahy Scale: Fair Sitting balance - Comments: supervision to maintain sitting                            Cognition Arousal/Alertness: Awake/alert (Initially eyes closed, wakes up with sitting) Behavior During Therapy: WFL for tasks assessed/performed Overall Cognitive Status: History of cognitive impairments - at baseline                      Exercises Other Exercises Other Exercises: pt is fearful of movements outside of base of support and L LE movement secondary to pain; towel slides across tray table x 20 reps with min vc's to maintain use of B UE's (Pt preference to use R UE); after 10 reps bed slightly raised to increase WB through B LE's; pt tolerated this exercise activity well with decreased fear of moving outside of base of support and promoting LE activity while weight bearing Other Exercises: Pt asked to comb her hair with B hands (promoting B Elbow flexion and shoulder flexion strengthening/activity)    General Comments General comments (skin integrity, edema, etc.): Pt initially with eyes closed though responding to PT.  Pt after sitting is agreeable to PT session.       Pertinent Vitals/Pain Faces Pain Scale: Hurts even more (subjective from patients facial expressions) Pain Location: Pt reports "Why does my leg hurt?" (painful expression with L LE movement) Pain Descriptors / Indicators:  (unable to state) Pain Intervention(s): Limited activity within patient's tolerance;Monitored during session;Repositioned  Pt O2 91% or greater throughout session on 3L of O2. Pt HR 100 bpm at rest.     Home Living                      Prior Function             PT Goals (current goals can now be found in the care plan section) Acute Rehab PT Goals Patient Stated Goal: To participate with self care PT Goal Formulation: With patient Progress towards PT goals: Progressing toward goals    Frequency    7X/week      PT Plan Current plan remains appropriate    Co-evaluation             End of Session Equipment Utilized During Treatment: Oxygen Activity Tolerance: Patient limited by fatigue;Patient limited by pain Patient left: in bed;with call bell/phone within reach;with bed alarm set;with family/visitor present (B heels suspended on pillow)     Time: 1610-9604 PT Time Calculation (min) (ACUTE ONLY): 33 min  Charges:                       G CodesAlbertina Senegal, SPT 06/15/2016, 10:27 AM

## 2016-06-15 NOTE — Discharge Summary (Signed)
[facility] - [place] at [facility]   PATIENT NAME: Melissa Cooper    MR#:  [id number]  DATE OF BIRTH:  08/28/1918  DATE OF ADMISSION:  06/12/2016 ADMITTING PHYSICIAN: Enedina Finner, MD  DATE OF DISCHARGE: 10/17/1/7  PRIMARY CARE PHYSICIAN: Tillman Abide, MD    ADMISSION DIAGNOSIS:  Closed intertrochanteric fracture of hip, left, initial encounter (HCC) [S72.142A]  DISCHARGE DIAGNOSIS:  Closed intertrochanteric left hip fracture s/p surgery POD # 3 Post op anemia s/p 1 unit BT  SECONDARY DIAGNOSIS:   Past Medical History:  Diagnosis Date  . CKD (chronic kidney disease)    stage 3  . Dementia   . HTN (hypertension)   . Hypercholesteremia   . OSA (obstructive sleep apnea)   . Osteoarthritis   . Osteoarthritis   . PA (pernicious anemia)     HOSPITAL COURSE:   Melissa Clarkis a 82 y.o. femalewith a known history of Vascular dementia, CKDstage III, hypertension, hypercholesterolemia who is a poor historian comes emergency room after she had an accidental fall at [facility] when she was trying to get out during the nighttime. Patient was found on the floor screaming for help. She was brought to the emergency room was found to have nondisplaced intertrochanteric left hip fracture.  1. Left hip intertrochanteric fracture status post mechanical fall at nursing home -POD #  3 -did well at surgery -prn pain meds and PT as tolerated. Pt is total care at [facility]  2. DVT prophylaxis per orthopedic after surgery Continue teds and SCDs  -lovenox SQ daily  3. Hypertension continue metoprolol  4. Acute on Chronic anemia -post op anemia. Will transfuse 1 unit BT today  -continue iron supplement  5. Vascular dementia. Pt appears not much responsive per RN -CT head shows no acute findings. Atrophy ad previous old infarct   D/w son in the room Pt will d/c [facility] after 1 unit BT today CONSULTS OBTAINED:  Treatment Team:  Melissa Flake, MD  DRUG  ALLERGIES:   Allergies  Allergen Reactions  . Actonel [Risedronate Sodium] Other (See Comments)    Unknown reaction  . Ceftin [Cefuroxime Axetil] Other (See Comments)    Unknown reaction  . Evista [Raloxifene] Other (See Comments)    Unknown reaction  . Miacalcin [Calcitonin ([place])] Other (See Comments)    Unknown reaction  . Zyrtec [Cetirizine] Other (See Comments)    Unknown reaction    DISCHARGE MEDICATIONS:   Current Discharge Medication List    START taking these medications   Details  docusate sodium (COLACE) 100 MG capsule Take 1 capsule (100 mg total) by mouth 2 (two) times daily. Qty: 10 capsule, Refills: 0    enoxaparin (LOVENOX) 30 MG/0.3ML injection Inject 0.3 mLs (30 mg total) into the skin daily. Qty: 14 Syringe, Refills: 0    polyethylene glycol (MIRALAX / GLYCOLAX) packet Take 17 g by mouth daily as needed for mild constipation. Qty: 14 each, Refills: 0      CONTINUE these medications which have NOT CHANGED   Details  acetaminophen (TYLENOL ARTHRITIS PAIN) 650 MG CR tablet Take 650 mg by mouth 3 (three) times daily.    acidophilus (RISAQUAD) CAPS capsule Take 1 capsule by mouth 2 (two) times daily. Qty: 60 capsule, Refills: 3    benzonatate (TESSALON) 100 MG capsule Take 1 capsule (100 mg total) by mouth 3 (three) times daily as needed for cough. Qty: 20 capsule, Refills: 0    ferrous sulfate 325 (65 FE) MG  tablet Take 325 mg by mouth daily.    metoprolol tartrate (LOPRESSOR) 25 MG tablet Take 1 tablet (25 mg total) by mouth 3 (three) times daily. HOLD IF HR<60 or SBP<100 Qty: 90 tablet, Refills: 2    Nutritional Supplements (ENSURE CLEAR) LIQD Take 1 Package by mouth 2 (two) times daily. Between meals    omeprazole (PRILOSEC) 20 MG capsule Take 20 mg by mouth daily.    ondansetron (ZOFRAN) 4 MG tablet Take 4 mg by mouth 3 (three) times daily as needed for nausea or vomiting.    traMADol (ULTRAM) 50 MG tablet Take 1 tablet (50 mg total) by mouth  every 6 (six) hours as needed for moderate pain or severe pain (As needed for Mod-severe pain). Qty: 20 tablet, Refills: 0    vitamin B-12 1000 MCG tablet Take 1 tablet (1,000 mcg total) by mouth daily. Qty: 30 tablet, Refills: 0        If you experience worsening of your admission symptoms, develop shortness of breath, life threatening emergency, suicidal or homicidal thoughts you must seek medical attention immediately by calling 911 or calling your MD immediately  if symptoms less severe.  You Must read complete instructions/literature along with all the possible adverse reactions/side effects for all the Medicines you take and that have been prescribed to you. Take any new Medicines after you have completely understood and accept all the possible adverse reactions/side effects.   Please note  You were cared for by a hospitalist during your hospital stay. If you have any questions about your discharge medications or the care you received while you were in the hospital after you are discharged, you can call the unit and asked to speak with the hospitalist on call if the hospitalist that took care of you is not available. Once you are discharged, your primary care physician will handle any further medical issues. Please note that NO REFILLS for any discharge medications will be authorized once you are discharged, as it is imperative that you return to your primary care physician (or establish a relationship with a primary care physician if you do not have one) for your aftercare needs so that they can reassess your need for medications and monitor your lab values. Today   SUBJECTIVE   More awake and talking sentences-short. No new issues per RN  VITAL SIGNS:  Blood pressure (!) 155/70, pulse (!) 104, temperature 98.7 F (37.1 C), temperature source Oral, resp. rate 19, height 5' (1.524 m), weight 57.9 kg (127 lb 11.2 oz), SpO2 100 %.  I/O:   Intake/Output Summary (Last 24 hours) at  06/15/16 1049 Last data filed at 06/15/16 0137  Gross per 24 hour  Intake          3412.09 ml  Output              250 ml  Net          3162.09 ml    PHYSICAL EXAMINATION:  GENERAL:  82 y.o.-year-old patient lying in the bed with no acute distress. Pallor+ EYES: Pupils equal, round, reactive to light and accommodation. No scleral icterus. Extraocular muscles intact.  HEENT: Head atraumatic, normocephalic. Oropharynx and nasopharynx clear.  NECK:  Supple, no jugular venous distention. No thyroid enlargement, no tenderness.  LUNGS: Normal breath sounds bilaterally, no wheezing, rales,rhonchi or crepitation. No use of accessory muscles of respiration.  CARDIOVASCULAR: S1, S2 normal. No murmurs, rubs, or gallops.  ABDOMEN: Soft, non-tender, non-distended. Bowel sounds present. No organomegaly or mass.  EXTREMITIES: No pedal edema, cyanosis, or clubbing.  NEUROLOGIC: grossly intactPSYCHIATRIC: The patient is alert and oriented x 3.  SKIN: No obvious rash, lesion, or ulcer.   DATA REVIEW:   CBC   Recent Labs Lab 06/15/16 0405  WBC 4.6  HGB 7.7*  HCT 22.9*  PLT 105*    Chemistries   Recent Labs Lab 06/12/16 0736  06/15/16 0405  NA 140  < > 143  K 4.3  < > 4.6  CL 108  < > 118*  CO2 25  < > 22  GLUCOSE 105*  < > 132*  BUN 37*  < > 22*  CREATININE 1.22*  < > 0.99  CALCIUM 9.4  < > 8.2*  AST 19  --   --   ALT 9*  --   --   ALKPHOS 78  --   --   BILITOT 0.3  --   --   < > = values in this interval not displayed.  Microbiology Results   Recent Results (from the past 240 hour(s))  MRSA PCR Screening     Status: Abnormal   Collection Time: 06/12/16 10:45 AM  Result Value Ref Range Status   MRSA by PCR POSITIVE (A) NEGATIVE Final    Comment:        The GeneXpert MRSA Assay (FDA approved for NASAL specimens only), is one component of a comprehensive MRSA colonization surveillance program. It is not intended to diagnose MRSA infection nor to guide or monitor  treatment for MRSA infections. RESULT CALLED TO, READ BACK BY AND VERIFIED WITH: Corey Skains @ [phone] 06/12/16 by [facility]     RADIOLOGY:  Ct Head Wo Contrast  Result Date: 06/14/2016 CLINICAL DATA:  Altered mental status. Recent fall with hip fracture on 06/12/2016. EXAM: CT HEAD WITHOUT CONTRAST TECHNIQUE: Contiguous axial images were obtained from the base of the skull through the vertex without intravenous contrast. COMPARISON:  CT scan dated 04/24/2014 FINDINGS: Brain: There is no acute intracranial hemorrhage, acute infarction, or intracranial mass lesion. There multiple old lacunar infarcts, most prominent in the right external capsule and the head of the right caudate nucleus. Extensive periventricular chronic white matter disease consistent with small vessel ischemic changes. Diffuse atrophy, most prominent in the temporal lobes. Vascular: Calcification in the carotid siphons and distal vertebral arteries. Skull: Negative. Sinuses/Orbits: Negative. Other: No visible scalp contusion or hematoma. IMPRESSION: No acute abnormality. Old infarct. Diffuse atrophy and chronic small vessel ischemic changes. Electronically Signed   By: Francene Boyers M.D.   On: 06/14/2016 11:23     Management plans discussed with the patient, family and they are in agreement.  CODE STATUS:     Code Status Orders        Start     Ordered   06/12/16 1202  Do not attempt resuscitation (DNR)  Continuous    Question Answer Comment  In the event of cardiac or respiratory ARREST Do not call a "code blue"   In the event of cardiac or respiratory ARREST Do not perform Intubation, CPR, defibrillation or ACLS   In the event of cardiac or respiratory ARREST Use medication by any route, position, wound care, and other measures to relive pain and suffering. May use oxygen, suction and manual treatment of airway obstruction as needed for comfort.      06/12/16 1201    Code Status History    Date Active Date Inactive Code  Status Order ID Comments User Context   06/12/2016  9:45  AM 06/12/2016 12:01 PM Full Code [id number]  Enedina Finner, MD Inpatient   09/28/2015  1:22 PM 10/06/2015  3:14 PM DNR [id number]  Enid Baas, MD Inpatient   08/29/2015  6:35 PM 09/05/2015  6:35 PM DNR [id number]  Enid Baas, MD Inpatient    Advance Directive Documentation   Flowsheet Row Most Recent Value  Type of Advance Directive  Healthcare Power of Attorney  Pre-existing out of facility DNR order (yellow form or pink MOST form)  No data  "MOST" Form in Place?  No data      TOTAL TIME TAKING CARE OF THIS PATIENT: .    Bevin Das M.D on 06/15/2016 at 10:49 AM  Between 7am to 6pm - Pager - [phone] After 6pm go to [email or web address] - password [email or web address] [facility]  [facility]  Office  [phone]  CC: Primary care physician; Tillman Abide, MD

## 2016-06-15 NOTE — Progress Notes (Signed)
[facility] discharged [facility] without incident.

## 2016-06-15 NOTE — Progress Notes (Signed)
Pt was more alert this shift. She followed commands tonight and I was able to administer her medications.

## 2016-06-15 NOTE — Progress Notes (Signed)
LCSW made aware from MD that patient is medically stable for discharge. [facility] will [facility]. Call placed to SNF: [facility] regarding discharge.  Awaiting call back and will continue to assist with disposition.  Will alert family once information have been complete and arranged. Will require EMS at DC.  Deretha EmoryHannah Tammie Yanda LCSW, MSW Clinical Social Work: Optician, dispensingystem Wide Float Coverage for :  Fredric MareBailey  [phone]

## 2016-06-15 NOTE — Discharge Instructions (Signed)
Lovenox 40mg  daily for 14 days following discharge. Staples can be removed by facility on 06/25/16. [facility] 6 weeks for x-rays and evaluation

## 2016-06-15 NOTE — Progress Notes (Signed)
  Subjective: 3 Days Post-Op Procedure(s) (LRB): INTRAMEDULLARY (IM) NAIL INTERTROCHANTRIC (Left) Patient is more talkative this AM, able to answer questions, states her pain is mild. Patient is well, and has had no acute complaints or problems Plan is to go Skilled nursing facility after hospital stay. Negative for chest pain and shortness of breath Fever: no Gastrointestinal:Negative for nausea and vomiting  Objective: Vital signs in last 24 hours: Temp:  [97.4 F (36.3 C)-99 F (37.2 C)] 98.7 F (37.1 C) (10/17 0800) Pulse Rate:  [100-105] 104 (10/17 0800) Resp:  [18-19] 19 (10/16 2345) BP: (117-155)/(56-81) 155/70 (10/17 0800) SpO2:  [96 %-100 %] 100 % (10/17 0800)  Intake/Output from previous day:  Intake/Output Summary (Last 24 hours) at 06/15/16 1047 Last data filed at 06/15/16 0137  Gross per 24 hour  Intake          3412.09 ml  Output              250 ml  Net          3162.09 ml    Intake/Output this shift: No intake/output data recorded.  Labs:  Recent Labs  06/13/16 0432 06/14/16 0416 06/14/16 1447 06/15/16 0405  HGB 9.2* 8.2* 8.0* 7.7*    Recent Labs  06/14/16 0416 06/15/16 0405  WBC 6.5 4.6  RBC 2.78* 2.60*  HCT 24.5* 22.9*  PLT 117* 105*    Recent Labs  06/14/16 0416 06/15/16 0405  NA 142 143  K 4.2 4.6  CL 115* 118*  CO2 24 22  BUN 29* 22*  CREATININE 1.24* 0.99  GLUCOSE 112* 132*  CALCIUM 8.5* 8.2*   No results for input(s): LABPT, INR in the last 72 hours.   EXAM General - Patient is much more talkative today, able to answer questions correctly Extremity - ABD soft Intact pulses distally Dorsiflexion/Plantar flexion intact Incision: scant drainage Dressing/Incision - blood tinged drainage to the most proximal incision, no signs of infection. Motor Function - intact, moving foot and toes well on exam.  Past Medical History:  Diagnosis Date  . CKD (chronic kidney disease)    stage 3  . Dementia   . HTN (hypertension)    . Hypercholesteremia   . OSA (obstructive sleep apnea)   . Osteoarthritis   . Osteoarthritis   . PA (pernicious anemia)     Assessment/Plan: 3 Days Post-Op Procedure(s) (LRB): INTRAMEDULLARY (IM) NAIL INTERTROCHANTRIC (Left) Active Problems:   Hip fracture (HCC)  Estimated body mass index is 24.94 kg/m as calculated from the following:   Height as of this encounter: 5' (1.524 m).   Weight as of this encounter: 57.9 kg (127 lb 11.2 oz). Advance diet Up with therapy   Labs reviewed, Hg 7.7 this AM, will order 1 unit of PRBC prior to discharge today. Pt is much more talkative today. Plan will be for discharge following blood transfusion. Lovenox 40mg  daily for 14 days following discharge. Staples can be removed by facility on 06/25/16. Follow-[facility] with [facility] in 6 weeks for x-rays and evaluation  DVT Prophylaxis - Lovenox, Foot Pumps and TED hose Weight-Bearing as tolerated to left leg  J. Horris LatinoLance Satsuki Zillmer, PA-C [facility] 06/15/2016, 10:47 AM

## 2016-06-16 DIAGNOSIS — S72012A Unspecified intracapsular fracture of left femur, initial encounter for closed fracture: Secondary | ICD-10-CM

## 2016-06-16 LAB — TYPE AND SCREEN
ABO/RH(D): O NEG
Antibody Screen: NEGATIVE
UNIT DIVISION: 0

## 2016-07-07 DIAGNOSIS — M1990 Unspecified osteoarthritis, unspecified site: Secondary | ICD-10-CM

## 2016-07-07 DIAGNOSIS — F0150 Vascular dementia without behavioral disturbance: Secondary | ICD-10-CM

## 2016-07-07 DIAGNOSIS — N183 Chronic kidney disease, stage 3 (moderate): Secondary | ICD-10-CM

## 2016-07-07 DIAGNOSIS — D631 Anemia in chronic kidney disease: Secondary | ICD-10-CM

## 2016-07-07 DIAGNOSIS — I10 Essential (primary) hypertension: Secondary | ICD-10-CM

## 2016-07-07 DIAGNOSIS — K219 Gastro-esophageal reflux disease without esophagitis: Secondary | ICD-10-CM

## 2016-07-12 ENCOUNTER — Telehealth: Payer: Self-pay

## 2016-07-12 NOTE — Telephone Encounter (Signed)
PLEASE NOTE: All timestamps contained within this report are represented as [place] Standard Time. CONFIDENTIALTY NOTICE: This fax transmission is intended only for the addressee. It contains information that is legally privileged, confidential or otherwise protected from use or disclosure. If you are not the intended recipient, you are strictly prohibited from reviewing, disclosing, copying using or disseminating any of this information or taking any action in reliance on or regarding this information. If you have received this fax in error, please notify us immediately by telephone so that we can arrange for its return to us. Phone: [phone], Toll-Free: [phone], Fax: [phone] Page: 1 of 1 Call Id: [id number] [facility] - Client Nonclinical Telephone Record [facility] Client [facility] - [facility] Contact Type Call Who Is Calling Physician / Provider / Hospital Call Type Provider Call Message Only Reason for Call Request to speak to Physician Initial Comment [facility] needs has UTI and needs a new RX, the one written pt is allergic to. Patient Name Melissa Cooper Patient DOB 11/12/1917 Requesting Provider Paulina Physician Number [phone] [facility] Call Closed By: Frutoso ChaseMaria Southerland Transaction Date/Time: 07/10/2016 7:30:09 PM (ET)

## 2016-07-12 NOTE — Telephone Encounter (Signed)
PLEASE NOTE: All timestamps contained within this report are represented as [place] Standard Time. CONFIDENTIALTY NOTICE: This fax transmission is intended only for the addressee. It contains information that is legally privileged, confidential or otherwise protected from use or disclosure. If you are not the intended recipient, you are strictly prohibited from reviewing, disclosing, copying using or disseminating any of this information or taking any action in reliance on or regarding this information. If you have received this fax in error, please notify us immediately by telephone so that we can arrange for its return to us. Phone: [phone], Toll-Free: [phone], Fax: [phone] Page: 1 of 1 Call Id: [id number] [place] [facility] - Client Nonclinical Telephone Record [facility] Client [facility] Night - Client Client Site [place] [facility] - Night Physician Tillman AbideLetvak, Richard - MD Contact Type Call Who Is Calling Physician / Provider / Hospital Call Type Provider Call [facility] Now Reason for Call Request to speak to Physician Initial Comment Caller States [facility] at [phone] , Pt has UTI on meds, allergic to meds needs a different one Additional Comment Patient Name Melissa Flakesllene Racicot Patient DOB 01/17/1918 Requesting Provider Paulina Physician Number [phone] [facility] Paging [facility] Phone DateTime Result/Outcome Message Type Notes Evelena PeatBurchette, Bruce - MD [phone] 07/10/2016 7:45:46 PM Paged On Call Back to Call Center Doctor Paged Please call Reuel BoomDaniel with [employer] at [phone] regarding a page, thank you. Evelena PeatBurchette, Bruce - MD 07/10/2016 7:47:46 PM Spoke with On Call - General Message Result Call Closed By: De Hollingsheadaniel Goldston Transaction Date/Time: 07/10/2016 7:34:42 PM (ET)

## 2016-07-12 NOTE — Telephone Encounter (Signed)
PLEASE NOTE: All timestamps contained within this report are represented as [place] Standard Time. CONFIDENTIALTY NOTICE: This fax transmission is intended only for the addressee. It contains information that is legally privileged, confidential or otherwise protected from use or disclosure. If you are not the intended recipient, you are strictly prohibited from reviewing, disclosing, copying using or disseminating any of this information or taking any action in reliance on or regarding this information. If you have received this fax in error, please notify us immediately by telephone so that we can arrange for its return to us. Phone: [phone], Toll-Free: [phone], Fax: [phone] Page: 1 of 1 Call Id: [id number] [facility] - Client Nonclinical Telephone Record [facility] Client [facility] - [facility] - Night Physician Tillman AbideLetvak, Richard - MD Contact Type Call Who Is Calling Physician / Provider / Hospital Call Type [facility] Reason for Call Request to speak to Physician Initial Comment Caller Judeth CornfieldStephanie with [facility] needs to speak to on call. Additional Comment Patient Name Melissa Flakesllene Zielke Patient DOB 10-Apr-1918 [facility]ing Provider [facility] Physician Number [phone] [facility] DateTime Result/Outcome Message Type Notes Evelena PeatBurchette, Bruce - MD [phone] 07/10/2016 4:09:19 PM Paged On Call Back to Call Center Doctor Paged Please call Reuel BoomDaniel with [facility] at [phone] regarding a page, thank you. Evelena PeatBurchette, Bruce - MD 07/10/2016 4:19:00 PM Spoke with On Call - General Message Result Call Closed By: De Hollingsheadaniel Goldston Transaction Date/Time: 07/10/2016 3:57:55 PM (ET)

## 2016-07-12 NOTE — Telephone Encounter (Signed)
Pt was not allergic to Rocephin, was allergic to Ceftin. RN mistake.

## 2016-09-13 DIAGNOSIS — J040 Acute laryngitis: Secondary | ICD-10-CM

## 2016-09-17 DIAGNOSIS — I10 Essential (primary) hypertension: Secondary | ICD-10-CM | POA: Diagnosis not present

## 2016-09-17 DIAGNOSIS — J069 Acute upper respiratory infection, unspecified: Secondary | ICD-10-CM

## 2016-09-17 DIAGNOSIS — R5383 Other fatigue: Secondary | ICD-10-CM

## 2016-09-17 DIAGNOSIS — R000 Tachycardia, unspecified: Secondary | ICD-10-CM

## 2016-09-18 ENCOUNTER — Encounter: Payer: Self-pay | Admitting: Emergency Medicine

## 2016-09-18 ENCOUNTER — Emergency Department: Payer: Medicare Other

## 2016-09-18 ENCOUNTER — Inpatient Hospital Stay
Admission: EM | Admit: 2016-09-18 | Discharge: 2016-09-20 | DRG: 193 | Disposition: A | Discharge status: Skilled Nursing Facility | Payer: Medicare Other | Attending: Internal Medicine | Admitting: Internal Medicine

## 2016-09-18 DIAGNOSIS — N183 Chronic kidney disease, stage 3 (moderate): Secondary | ICD-10-CM | POA: Diagnosis present

## 2016-09-18 DIAGNOSIS — I129 Hypertensive chronic kidney disease with stage 1 through stage 4 chronic kidney disease, or unspecified chronic kidney disease: Secondary | ICD-10-CM | POA: Diagnosis present

## 2016-09-18 DIAGNOSIS — F0390 Unspecified dementia without behavioral disturbance: Secondary | ICD-10-CM | POA: Diagnosis present

## 2016-09-18 DIAGNOSIS — Z79891 Long term (current) use of opiate analgesic: Secondary | ICD-10-CM | POA: Diagnosis not present

## 2016-09-18 DIAGNOSIS — A0471 Enterocolitis due to Clostridium difficile, recurrent: Secondary | ICD-10-CM | POA: Diagnosis present

## 2016-09-18 DIAGNOSIS — Z79899 Other long term (current) drug therapy: Secondary | ICD-10-CM | POA: Diagnosis not present

## 2016-09-18 DIAGNOSIS — Z8781 Personal history of (healed) traumatic fracture: Secondary | ICD-10-CM

## 2016-09-18 DIAGNOSIS — E860 Dehydration: Secondary | ICD-10-CM | POA: Diagnosis present

## 2016-09-18 DIAGNOSIS — R9389 Abnormal findings on diagnostic imaging of other specified body structures: Secondary | ICD-10-CM

## 2016-09-18 DIAGNOSIS — R0689 Other abnormalities of breathing: Secondary | ICD-10-CM

## 2016-09-18 DIAGNOSIS — Z66 Do not resuscitate: Secondary | ICD-10-CM | POA: Diagnosis present

## 2016-09-18 DIAGNOSIS — E43 Unspecified severe protein-calorie malnutrition: Secondary | ICD-10-CM

## 2016-09-18 DIAGNOSIS — D510 Vitamin B12 deficiency anemia due to intrinsic factor deficiency: Secondary | ICD-10-CM | POA: Diagnosis present

## 2016-09-18 DIAGNOSIS — M6281 Muscle weakness (generalized): Secondary | ICD-10-CM

## 2016-09-18 DIAGNOSIS — E7800 Pure hypercholesterolemia, unspecified: Secondary | ICD-10-CM | POA: Diagnosis present

## 2016-09-18 DIAGNOSIS — Z888 Allergy status to other drugs, medicaments and biological substances status: Secondary | ICD-10-CM

## 2016-09-18 DIAGNOSIS — J9601 Acute respiratory failure with hypoxia: Secondary | ICD-10-CM | POA: Diagnosis present

## 2016-09-18 DIAGNOSIS — H9190 Unspecified hearing loss, unspecified ear: Secondary | ICD-10-CM | POA: Diagnosis present

## 2016-09-18 DIAGNOSIS — G4733 Obstructive sleep apnea (adult) (pediatric): Secondary | ICD-10-CM | POA: Diagnosis present

## 2016-09-18 DIAGNOSIS — J09X1 Influenza due to identified novel influenza A virus with pneumonia: Principal | ICD-10-CM | POA: Diagnosis present

## 2016-09-18 DIAGNOSIS — N179 Acute kidney failure, unspecified: Secondary | ICD-10-CM

## 2016-09-18 DIAGNOSIS — J101 Influenza due to other identified influenza virus with other respiratory manifestations: Secondary | ICD-10-CM

## 2016-09-18 DIAGNOSIS — Z881 Allergy status to other antibiotic agents status: Secondary | ICD-10-CM | POA: Diagnosis not present

## 2016-09-18 DIAGNOSIS — R0602 Shortness of breath: Secondary | ICD-10-CM | POA: Diagnosis present

## 2016-09-18 LAB — COMPREHENSIVE METABOLIC PANEL
ALK PHOS: 71 U/L (ref 38–126)
ALT: 9 U/L — AB (ref 14–54)
AST: 27 U/L (ref 15–41)
Albumin: 3.5 g/dL (ref 3.5–5.0)
Anion gap: 12 (ref 5–15)
BUN: 37 mg/dL — AB (ref 6–20)
CALCIUM: 8.9 mg/dL (ref 8.9–10.3)
CHLORIDE: 103 mmol/L (ref 101–111)
CO2: 24 mmol/L (ref 22–32)
CREATININE: 1.83 mg/dL — AB (ref 0.44–1.00)
GFR, EST AFRICAN AMERICAN: 26 mL/min — AB (ref >60)
GFR, EST NON AFRICAN AMERICAN: 22 mL/min — AB (ref >60)
Glucose, Bld: 111 mg/dL — ABNORMAL HIGH (ref 65–99)
Potassium: 3.6 mmol/L (ref 3.5–5.1)
Sodium: 139 mmol/L (ref 135–145)
Total Bilirubin: 0.3 mg/dL (ref 0.3–1.2)
Total Protein: 7.6 g/dL (ref 6.5–8.1)

## 2016-09-18 LAB — URINALYSIS, COMPLETE (UACMP) WITH MICROSCOPIC
BILIRUBIN URINE: NEGATIVE
Bacteria, UA: NONE SEEN
Glucose, UA: NEGATIVE mg/dL
Hgb urine dipstick: NEGATIVE
KETONES UR: NEGATIVE mg/dL
Leukocytes, UA: NEGATIVE
Nitrite: NEGATIVE
Protein, ur: 100 mg/dL — AB
Specific Gravity, Urine: 1.015 (ref 1.005–1.030)
pH: 5 (ref 5.0–8.0)

## 2016-09-18 LAB — CBC WITH DIFFERENTIAL/PLATELET
BASOS ABS: 0 10*3/uL (ref 0–0.1)
Basophils Relative: 0 %
EOS PCT: 0 %
Eosinophils Absolute: 0 10*3/uL (ref 0–0.7)
HCT: 37 % (ref 35.0–47.0)
Hemoglobin: 12.5 g/dL (ref 12.0–16.0)
LYMPHS ABS: 1.3 10*3/uL (ref 1.0–3.6)
LYMPHS PCT: 12 %
MCH: 29.9 pg (ref 26.0–34.0)
MCHC: 33.8 g/dL (ref 32.0–36.0)
MCV: 88.4 fL (ref 80.0–100.0)
MONO ABS: 0.8 10*3/uL (ref 0.2–0.9)
Monocytes Relative: 7 %
Neutro Abs: 9.3 10*3/uL — ABNORMAL HIGH (ref 1.4–6.5)
Neutrophils Relative %: 81 %
PLATELETS: 231 10*3/uL (ref 150–440)
RBC: 4.18 MIL/uL (ref 3.80–5.20)
RDW: 14.2 % (ref 11.5–14.5)
WBC: 11.5 10*3/uL — ABNORMAL HIGH (ref 3.6–11.0)

## 2016-09-18 LAB — C DIFFICILE QUICK SCREEN W PCR REFLEX
C DIFFICILE (CDIFF) TOXIN: NEGATIVE
C Diff antigen: POSITIVE — AB

## 2016-09-18 LAB — CLOSTRIDIUM DIFFICILE BY PCR: Toxigenic C. Difficile by PCR: POSITIVE — AB

## 2016-09-18 LAB — PROTIME-INR
INR: 1.06
PROTHROMBIN TIME: 13.8 s (ref 11.4–15.2)

## 2016-09-18 LAB — MRSA PCR SCREENING: MRSA BY PCR: POSITIVE — AB

## 2016-09-18 LAB — INFLUENZA PANEL BY PCR (TYPE A & B)
Influenza A By PCR: POSITIVE — AB
Influenza B By PCR: NEGATIVE

## 2016-09-18 LAB — LACTIC ACID, PLASMA: LACTIC ACID, VENOUS: 1.3 mmol/L (ref 0.5–1.9)

## 2016-09-18 MED ORDER — BISACODYL 10 MG RE SUPP: 10.0000 mg | Freq: Every day | RECTAL | Status: DC | PRN

## 2016-09-18 MED ORDER — FERROUS SULFATE 325 (65 FE) MG PO TABS
325.0000 mg | ORAL_TABLET | Freq: Every day | ORAL | Status: DC
  Administered 2016-09-18 – 2016-09-20 (×3): 325 mg via ORAL
  Filled 2016-09-18 (×3): qty 1

## 2016-09-18 MED ORDER — ONDANSETRON 4 MG PO TBDP
ORAL_TABLET | ORAL | Status: AC
  Filled 2016-09-18: qty 1

## 2016-09-18 MED ORDER — DEXTROSE 5 % IV SOLN
500.0000 mg | INTRAVENOUS | Status: DC
  Administered 2016-09-18: 500 mg via INTRAVENOUS

## 2016-09-18 MED ORDER — DOCUSATE SODIUM 100 MG PO CAPS
100.0000 mg | ORAL_CAPSULE | Freq: Two times a day (BID) | ORAL | Status: DC
  Administered 2016-09-18 – 2016-09-19 (×2): 100 mg via ORAL
  Filled 2016-09-18 (×2): qty 1

## 2016-09-18 MED ORDER — GUAIFENESIN 100 MG/5ML PO SYRP
200.0000 mg | ORAL_SOLUTION | ORAL | Status: DC | PRN
  Filled 2016-09-18: qty 10

## 2016-09-18 MED ORDER — MUPIROCIN 2 % EX OINT
1.0000 "application " | TOPICAL_OINTMENT | Freq: Two times a day (BID) | CUTANEOUS | Status: DC
  Administered 2016-09-18 – 2016-09-20 (×4): 1 via NASAL
  Filled 2016-09-18: qty 22

## 2016-09-18 MED ORDER — VITAMIN B-12 1000 MCG PO TABS
1000.0000 ug | ORAL_TABLET | Freq: Every day | ORAL | Status: DC
  Administered 2016-09-18 – 2016-09-20 (×3): 1000 ug via ORAL
  Filled 2016-09-18 (×3): qty 1

## 2016-09-18 MED ORDER — PANTOPRAZOLE SODIUM 40 MG PO TBEC
40.0000 mg | DELAYED_RELEASE_TABLET | Freq: Every day | ORAL | Status: DC
  Administered 2016-09-18 – 2016-09-20 (×3): 40 mg via ORAL
  Filled 2016-09-18 (×3): qty 1

## 2016-09-18 MED ORDER — CHLORHEXIDINE GLUCONATE CLOTH 2 % EX PADS
6.0000 | MEDICATED_PAD | Freq: Every day | CUTANEOUS | Status: DC
  Administered 2016-09-19 – 2016-09-20 (×2): 6 via TOPICAL

## 2016-09-18 MED ORDER — HEPARIN SODIUM (PORCINE) 5000 UNIT/ML IJ SOLN
5000.0000 [IU] | Freq: Three times a day (TID) | INTRAMUSCULAR | Status: DC
  Administered 2016-09-18 – 2016-09-20 (×7): 5000 [IU] via SUBCUTANEOUS
  Filled 2016-09-18 (×7): qty 1

## 2016-09-18 MED ORDER — SODIUM CHLORIDE 0.9 % IV BOLUS (SEPSIS)
500.0000 mL | Freq: Once | INTRAVENOUS | Status: AC
  Administered 2016-09-18: 500 mL via INTRAVENOUS

## 2016-09-18 MED ORDER — OSELTAMIVIR PHOSPHATE 75 MG PO CAPS
ORAL_CAPSULE | ORAL | Status: AC
  Filled 2016-09-18: qty 1

## 2016-09-18 MED ORDER — DOCUSATE SODIUM 100 MG PO CAPS
100.0000 mg | ORAL_CAPSULE | Freq: Two times a day (BID) | ORAL | Status: DC
  Administered 2016-09-19: 100 mg via ORAL

## 2016-09-18 MED ORDER — DEXTROSE 5 % IV SOLN
INTRAVENOUS | Status: AC
  Filled 2016-09-18: qty 500

## 2016-09-18 MED ORDER — POLYETHYLENE GLYCOL 3350 17 G PO PACK: 17.0000 g | PACK | Freq: Every day | ORAL | Status: DC | PRN

## 2016-09-18 MED ORDER — DM-GUAIFENESIN ER 30-600 MG PO TB12
1.0000 | ORAL_TABLET | Freq: Two times a day (BID) | ORAL | Status: DC
  Filled 2016-09-18 (×2): qty 1

## 2016-09-18 MED ORDER — POTASSIUM CHLORIDE IN NACL 20-0.9 MEQ/L-% IV SOLN
INTRAVENOUS | Status: DC
  Administered 2016-09-18 – 2016-09-19 (×2): via INTRAVENOUS
  Filled 2016-09-18 (×3): qty 1000

## 2016-09-18 MED ORDER — OSELTAMIVIR PHOSPHATE 30 MG PO CAPS
30.0000 mg | ORAL_CAPSULE | Freq: Once | ORAL | Status: AC
Start: 2016-09-18 — End: 2016-09-18
  Administered 2016-09-18: 30 mg via ORAL
  Filled 2016-09-18: qty 1

## 2016-09-18 MED ORDER — ACETAMINOPHEN 325 MG PO TABS: 650.0000 mg | ORAL_TABLET | Freq: Four times a day (QID) | ORAL | Status: DC | PRN

## 2016-09-18 MED ORDER — GUAIFENESIN-DM 100-10 MG/5ML PO SYRP
10.0000 mL | ORAL_SOLUTION | Freq: Two times a day (BID) | ORAL | Status: DC
  Administered 2016-09-18 – 2016-09-20 (×5): 10 mL via ORAL
  Filled 2016-09-18 (×5): qty 10

## 2016-09-18 MED ORDER — ONDANSETRON HCL 4 MG/2ML IJ SOLN: 4.0000 mg | Freq: Four times a day (QID) | INTRAMUSCULAR | Status: DC | PRN

## 2016-09-18 MED ORDER — IPRATROPIUM-ALBUTEROL 0.5-2.5 (3) MG/3ML IN SOLN
3.0000 mL | Freq: Four times a day (QID) | RESPIRATORY_TRACT | Status: DC
  Administered 2016-09-18 – 2016-09-19 (×8): 3 mL via RESPIRATORY_TRACT
  Filled 2016-09-18 (×7): qty 3

## 2016-09-18 MED ORDER — OSELTAMIVIR PHOSPHATE 30 MG PO CAPS
30.0000 mg | ORAL_CAPSULE | Freq: Every day | ORAL | Status: DC
  Administered 2016-09-19 – 2016-09-20 (×2): 30 mg via ORAL
  Filled 2016-09-18 (×2): qty 1

## 2016-09-18 MED ORDER — ACETAMINOPHEN 650 MG RE SUPP: 650.0000 mg | Freq: Four times a day (QID) | RECTAL | Status: DC | PRN

## 2016-09-18 MED ORDER — ENSURE ENLIVE PO LIQD
1.0000 | Freq: Two times a day (BID) | ORAL | Status: DC
  Administered 2016-09-18 – 2016-09-20 (×4): 237 mL via ORAL

## 2016-09-18 MED ORDER — ONDANSETRON HCL 4 MG PO TABS: 4.0000 mg | ORAL_TABLET | Freq: Four times a day (QID) | ORAL | Status: DC | PRN

## 2016-09-18 MED ORDER — TRAMADOL HCL 50 MG PO TABS: 50.0000 mg | ORAL_TABLET | Freq: Four times a day (QID) | ORAL | Status: DC | PRN

## 2016-09-18 MED ORDER — IPRATROPIUM-ALBUTEROL 0.5-2.5 (3) MG/3ML IN SOLN
RESPIRATORY_TRACT | Status: AC
  Administered 2016-09-18: 3 mL via RESPIRATORY_TRACT
  Filled 2016-09-18: qty 3

## 2016-09-18 MED ORDER — METOPROLOL TARTRATE 25 MG PO TABS
25.0000 mg | ORAL_TABLET | Freq: Three times a day (TID) | ORAL | Status: DC
  Administered 2016-09-18 – 2016-09-20 (×7): 25 mg via ORAL
  Filled 2016-09-18 (×7): qty 1

## 2016-09-18 MED ORDER — BENZONATATE 100 MG PO CAPS
100.0000 mg | ORAL_CAPSULE | Freq: Three times a day (TID) | ORAL | Status: DC | PRN
  Administered 2016-09-18 – 2016-09-19 (×2): 100 mg via ORAL
  Filled 2016-09-18 (×2): qty 1

## 2016-09-18 NOTE — ED Provider Notes (Signed)
[facility] Emergency Department Provider Note  ____________________________________________  Time seen: Approximately 10:31 AM  I have reviewed the triage vital signs and the nursing notes.   HISTORY  Chief Complaint Shortness of Breath  Level 5 caveat:  Portions of the history and physical were unable to be obtained due to dementia   HPI Melissa Cooper is a 83 y.o. female history of dementia, CKD, hypertension, OSA, pernicious anemia who presentsfor evaluation of coughing and shortness of breath. According to EMS patient has had cough for the last few days. Had a chest x-ray concerning for pneumonia which prompted them to call EMS. On arrival patient was satting in the mid 80s and was placed on 2 L nasal cannula. Patient is only able to tell me that she has been coughing and that she has had no chest pain, abdominal pain, nausea. No fever per the facility. No vomiting or diarrhea per the facility.  Past Medical History:  Diagnosis Date  . CKD (chronic kidney disease)    stage 3  . Dementia   . HTN (hypertension)   . Hypercholesteremia   . OSA (obstructive sleep apnea)   . Osteoarthritis   . Osteoarthritis   . PA (pernicious anemia)     Patient Active Problem List   Diagnosis Date Noted  . Hip fracture (HCC) 06/12/2016  . Protein-calorie malnutrition, severe 09/30/2015  . Recurrent colitis due to Clostridium difficile 09/28/2015  . Clostridium difficile colitis 09/05/2015  . Pressure ulcer 08/30/2015  . Sepsis (HCC) 08/29/2015    Past Surgical History:  Procedure Laterality Date  . INTRAMEDULLARY (IM) NAIL INTERTROCHANTERIC Left 06/12/2016   Procedure: INTRAMEDULLARY (IM) NAIL INTERTROCHANTRIC;  Surgeon: Christena FlakeJohn J Poggi, MD;  Location: [facility] ORS;  Service: Orthopedics;  Laterality: Left;  . none      Prior to Admission medications   Medication Sig Start Date End Date Taking? Authorizing Provider  acetaminophen (TYLENOL ARTHRITIS PAIN) 650 MG  CR tablet Take 650 mg by mouth 3 (three) times daily.   Yes Historical Provider, MD  acidophilus (RISAQUAD) CAPS capsule Take 1 capsule by mouth 2 (two) times daily. Patient taking differently: Take 1 capsule by mouth daily.  09/05/15  Yes Gale Journeyatherine P Walsh, MD  benzonatate (TESSALON) 100 MG capsule Take 1 capsule (100 mg total) by mouth 3 (three) times daily as needed for cough. 10/06/15  Yes Enid Baasadhika Kalisetti, MD  docusate sodium (COLACE) 100 MG capsule Take 1 capsule (100 mg total) by mouth 2 (two) times daily. 06/15/16  Yes Enedina FinnerSona Patel, MD  ferrous sulfate 325 (65 FE) MG tablet Take 325 mg by mouth daily.   Yes Historical Provider, MD  guaifenesin (ROBITUSSIN) 100 MG/5ML syrup Take 200 mg by mouth every 4 (four) hours as needed for cough.   Yes Historical Provider, MD  metoprolol tartrate (LOPRESSOR) 25 MG tablet Take 1 tablet (25 mg total) by mouth 3 (three) times daily. HOLD IF HR<60 or SBP<100 10/06/15  Yes Enid Baasadhika Kalisetti, MD  Nutritional Supplements (ENSURE CLEAR) LIQD Take 1 Package by mouth 2 (two) times daily. Between meals   Yes Historical Provider, MD  omeprazole (PRILOSEC) 20 MG capsule Take 20 mg by mouth every other day.    Yes Historical Provider, MD  ondansetron (ZOFRAN) 4 MG tablet Take 4 mg by mouth 3 (three) times daily as needed for nausea or vomiting.   Yes Historical Provider, MD  polyethylene glycol (MIRALAX / GLYCOLAX) packet Take 17 g by mouth daily as needed for mild constipation.  06/15/16  Yes Enedina Finner, MD  traMADol (ULTRAM) 50 MG tablet Take 1 tablet (50 mg total) by mouth every 6 (six) hours as needed for moderate pain or severe pain (As needed for Mod-severe pain). Patient taking differently: Take 50 mg by mouth every 4 (four) hours as needed for moderate pain or severe pain.  10/06/15  Yes Enid Baas, MD  vitamin B-12 1000 MCG tablet Take 1 tablet (1,000 mcg total) by mouth daily. 09/05/15  Yes Gale Journey, MD  enoxaparin (LOVENOX) 30 MG/0.3ML injection Inject  0.3 mLs (30 mg total) into the skin daily. Patient not taking: Reported on 09/18/2016 06/16/16   Enedina Finner, MD    Allergies Actonel [risedronate sodium]; Ceftin [cefuroxime axetil]; Evista [raloxifene]; Miacalcin [calcitonin (salmon)]; and Zyrtec [cetirizine]  History reviewed. No pertinent family history.  Social History Social History  Substance Use Topics  . Smoking status: Never Smoker  . Smokeless tobacco: Never Used  . Alcohol use No    Review of Systems  Constitutional: Negative for fever. Eyes: Negative for visual changes. ENT: Negative for sore throat. Neck: No neck pain  Cardiovascular: Negative for chest pain. Respiratory: + shortness of breath and cough Gastrointestinal: Negative for abdominal pain, vomiting or diarrhea. Genitourinary: Negative for dysuria. Musculoskeletal: Negative for back pain. Skin: Negative for rash. Neurological: Negative for headaches, weakness or numbness. Psych: No SI or HI  ____________________________________________   PHYSICAL EXAM:  VITAL SIGNS: ED Triage Vitals  Enc Vitals Group     BP 09/18/16 0933 (!) 145/65     Pulse Rate 09/18/16 0933 (!) 105     Resp 09/18/16 0933 (!) 28     Temp 09/18/16 0933 99.2 F (37.3 C)     Temp Source 09/18/16 0933 Oral     SpO2 09/18/16 0931 (!) 85 %     Weight 09/18/16 0934 130 lb (59 kg)     Height --      Head Circumference --      Peak Flow --      Pain Score --      Pain Loc --      Pain Edu? --      Excl. in GC? --     Constitutional: Alert and oriented to self only. Well appearing and in no apparent distress. HEENT:      Head: Normocephalic and atraumatic.         Eyes: Conjunctivae are normal. Sclera is non-icteric. EOMI. PERRL      Mouth/Throat: Mucous membranes are moist.       Neck: Supple with no signs of meningismus. Cardiovascular: Tachycardic with regular rhythm. No murmurs, gallops, or rubs. 2+ symmetrical distal pulses are present in all extremities. No  JVD. Respiratory: Patient's tachypnea And hypoxic on room air which improved his on 2 L nasal cannula, she has coarse breath sounds bilaterally with no wheezing  Gastrointestinal: Soft, non tender, and non distended with positive bowel sounds. No rebound or guarding. Musculoskeletal: Nontender with normal range of motion in all extremities. No edema, cyanosis, or erythema of extremities. Neurologic: Normal speech and language. Face is symmetric. Moving all extremities. No gross focal neurologic deficits are appreciated. Skin: Skin is warm, dry and intact. No rash noted. Psychiatric: Mood and affect are normal. Speech and behavior are normal.  ____________________________________________   LABS (all labs ordered are listed, but only abnormal results are displayed)  Labs Reviewed  COMPREHENSIVE METABOLIC PANEL - Abnormal; Notable for the following:       Result  Value   Glucose, Bld 111 (*)    BUN 37 (*)    Creatinine, Ser 1.83 (*)    ALT 9 (*)    GFR calc non Af Amer 22 (*)    GFR calc Af Amer 26 (*)    All other components within normal limits  CBC WITH DIFFERENTIAL/PLATELET - Abnormal; Notable for the following:    WBC 11.5 (*)    Neutro Abs 9.3 (*)    All other components within normal limits  URINALYSIS, COMPLETE (UACMP) WITH MICROSCOPIC - Abnormal; Notable for the following:    Color, Urine YELLOW (*)    APPearance CLOUDY (*)    Protein, ur 100 (*)    Squamous Epithelial / LPF 0-5 (*)    All other components within normal limits  INFLUENZA PANEL BY PCR (TYPE A & B) - Abnormal; Notable for the following:    Influenza A By PCR POSITIVE (*)    All other components within normal limits  CULTURE, BLOOD (ROUTINE X 2)  CULTURE, BLOOD (ROUTINE X 2)  URINE CULTURE  C DIFFICILE QUICK SCREEN W PCR REFLEX  LACTIC ACID, PLASMA  PROTIME-INR  LACTIC ACID, PLASMA   ____________________________________________  EKG  ED ECG REPORT I, Nita Sickle, the attending physician,  personally viewed and interpreted this ECG.  Sinus tachycardia, rate of 111, normal intervals, normal axis, no ST elevations or depressions. ____________________________________________  RADIOLOGY  CXR: No Pneumonia ____________________________________________   PROCEDURES  Procedure(s) performed: None Procedures Critical Care performed:  Yes  CRITICAL CARE Performed by: Nita Sickle  ?  Total critical care time: 35 min  Critical care time was exclusive of separately billable procedures and treating other patients.  Critical care was necessary to treat or prevent imminent or life-threatening deterioration.  Critical care was time spent personally by me on the following activities: development of treatment plan with patient and/or surrogate as well as nursing, discussions with consultants, evaluation of patient's response to treatment, examination of patient, obtaining history from patient or surrogate, ordering and performing treatments and interventions, ordering and review of laboratory studies, ordering and review of radiographic studies, pulse oximetry and re-evaluation of patient's condition.  ____________________________________________   INITIAL IMPRESSION / ASSESSMENT AND PLAN / ED COURSE  83 y.o. female history of dementia, CKD, hypertension, OSA, pernicious anemia who presentsfor evaluation of coughing and shortness of breath and a chest x-ray concerning for pneumonia. Patient is tachypnea with new oxygen requirement and tachycardic. She is afebrile with coarse breath sounds. We'll repeat chest x-ray, get flu swab, check basic blood work. We'll watch patient carefully on telemetry.   _________________________ 11:24 AM on 09/18/2016 -----------------------------------------  Patient is fluid positive. Chest x-ray with no evidence of pneumonia. Patient also has acute kidney injury with creatinine of 1.83 (baseline is 0.9). We'll give IV fluids, start patient on  Tamiflu reduced dose of 30 mg daily based on kidney function and admitted to the hospitalist.    Pertinent labs & imaging results that were available during my care of the patient were reviewed by me and considered in my medical decision making (see chart for details).    ____________________________________________   FINAL CLINICAL IMPRESSION(S) / ED DIAGNOSES  Final diagnoses:  Influenza A  AKI (acute kidney injury) (HCC)  Acute respiratory failure with hypoxia (HCC)      NEW MEDICATIONS STARTED DURING THIS VISIT:  New Prescriptions   No medications on file     Note:  This document was prepared using Dragon voice recognition software  and may include unintentional dictation errors.    Nita Sickle, MD 09/18/16 1125

## 2016-09-18 NOTE — H&P (Signed)
History and Physical    Melissa Cooper UJW:[id number] DOB: [birth year] DOA: 09/18/2016  Referring physician: Dr. Don Perking PCP: Tillman Abide, MD  Specialists: none  Chief Complaint: fever and cough  HPI: Melissa Cooper is a 83 y.o. female has a past medical history significant for CKD, HTN, and dementia sent from [facility] SNF to ER with fever and cough. In ER, pt was febrile and tested positive for influenza A. CXR suggests possible early PNA as well. She has dehydration with AKI. She is now admitted. She remains slightly hypoxic. Her hearing is poor and she cannot answer questions. Her confusion is at baseline per her sons.   Review of Systems: unable to obtain due to pt's confusion and hearing loss.   Past Medical History:  Diagnosis Date  . CKD (chronic kidney disease)    stage 3  . Dementia   . HTN (hypertension)   . Hypercholesteremia   . OSA (obstructive sleep apnea)   . Osteoarthritis   . Osteoarthritis   . PA (pernicious anemia)    Past Surgical History:  Procedure Laterality Date  . INTRAMEDULLARY (IM) NAIL INTERTROCHANTERIC Left 06/12/2016   Procedure: INTRAMEDULLARY (IM) NAIL INTERTROCHANTRIC;  Surgeon: Christena Flake, MD;  Location: [facility] ORS;  Service: Orthopedics;  Laterality: Left;  . none     Social History:  reports that she has never smoked. She has never used smokeless tobacco. She reports that she does not drink alcohol or use drugs.  Allergies  Allergen Reactions  . Actonel [Risedronate Sodium] Other (See Comments)    Unknown reaction  . Ceftin [Cefuroxime Axetil] Other (See Comments)    Unknown reaction  . Evista [Raloxifene] Other (See Comments)    Unknown reaction  . Miacalcin [Calcitonin (Salmon)] Other (See Comments)    Unknown reaction  . Zyrtec [Cetirizine] Other (See Comments)    Unknown reaction    History reviewed. No pertinent family history.  Prior to Admission medications   Medication Sig Start Date End Date Taking? Authorizing  Provider  acetaminophen (TYLENOL ARTHRITIS PAIN) 650 MG CR tablet Take 650 mg by mouth 3 (three) times daily.   Yes Historical Provider, MD  acidophilus (RISAQUAD) CAPS capsule Take 1 capsule by mouth 2 (two) times daily. Patient taking differently: Take 1 capsule by mouth daily.  09/05/15  Yes Gale Journey, MD  benzonatate (TESSALON) 100 MG capsule Take 1 capsule (100 mg total) by mouth 3 (three) times daily as needed for cough. 10/06/15  Yes Enid Baas, MD  docusate sodium (COLACE) 100 MG capsule Take 1 capsule (100 mg total) by mouth 2 (two) times daily. 06/15/16  Yes Enedina Finner, MD  ferrous sulfate 325 (65 FE) MG tablet Take 325 mg by mouth daily.   Yes Historical Provider, MD  guaifenesin (ROBITUSSIN) 100 MG/5ML syrup Take 200 mg by mouth every 4 (four) hours as needed for cough.   Yes Historical Provider, MD  metoprolol tartrate (LOPRESSOR) 25 MG tablet Take 1 tablet (25 mg total) by mouth 3 (three) times daily. HOLD IF HR<60 or SBP<100 10/06/15  Yes Enid Baas, MD  Nutritional Supplements (ENSURE CLEAR) LIQD Take 1 Package by mouth 2 (two) times daily. Between meals   Yes Historical Provider, MD  omeprazole (PRILOSEC) 20 MG capsule Take 20 mg by mouth every other day.    Yes Historical Provider, MD  ondansetron (ZOFRAN) 4 MG tablet Take 4 mg by mouth 3 (three) times daily as needed for nausea or vomiting.   Yes Historical  Provider, MD  polyethylene glycol (MIRALAX / GLYCOLAX) packet Take 17 g by mouth daily as needed for mild constipation. 06/15/16  Yes Enedina Finner, MD  traMADol (ULTRAM) 50 MG tablet Take 1 tablet (50 mg total) by mouth every 6 (six) hours as needed for moderate pain or severe pain (As needed for Mod-severe pain). Patient taking differently: Take 50 mg by mouth every 4 (four) hours as needed for moderate pain or severe pain.  10/06/15  Yes Enid Baas, MD  vitamin B-12 1000 MCG tablet Take 1 tablet (1,000 mcg total) by mouth daily. 09/05/15  Yes Gale Journey,  MD  enoxaparin (LOVENOX) 30 MG/0.3ML injection Inject 0.3 mLs (30 mg total) into the skin daily. Patient not taking: Reported on 09/18/2016 06/16/16   Enedina Finner, MD   Physical Exam: Vitals:   09/18/16 1030 09/18/16 1100 09/18/16 1130 09/18/16 1200  BP: 122/70 (!) 141/64 (!) 146/75 (!) 143/70  Pulse: (!) 104 99 (!) 110 (!) 104  Resp: (!) 31 (!) 24 16 20   Temp:      TempSrc:      SpO2: 100% 99% 92% 100%  Weight:         General:  No apparent distress, WDWN, /AT  Eyes: PERRL, EOMI, no scleral icterus, conjunctiva clear  ENT: dry oropharynx without exudate, TM's benign, dentition poor  Neck: supple, no lymphadenopathy. No thyromegaly or bruits  Cardiovascular: regular rate without MRG; 2+ peripheral pulses, no JVD, no peripheral edema  Respiratory: basilar rhonchi without wheezes or rales. No dullness. Respiratory effort normal  Abdomen: soft, non tender to palpation, positive bowel sounds, no guarding, no rebound  Skin: no rashes or lesions  Musculoskeletal: normal bulk and tone, no joint swelling  Psychiatric: unable to assess  Neurologic: CN 2-12 grossly intact, Motor strength 5/5 in all 4 groups with symmetric DTR's and non-focal sensory exam  Labs on Admission:  Basic Metabolic Panel:  Recent Labs Lab 09/18/16 0940  NA 139  K 3.6  CL 103  CO2 24  GLUCOSE 111*  BUN 37*  CREATININE 1.83*  CALCIUM 8.9   Liver Function Tests:  Recent Labs Lab 09/18/16 0940  AST 27  ALT 9*  ALKPHOS 71  BILITOT 0.3  PROT 7.6  ALBUMIN 3.5   No results for input(s): LIPASE, AMYLASE in the last 168 hours. No results for input(s): AMMONIA in the last 168 hours. CBC:  Recent Labs Lab 09/18/16 0940  WBC 11.5*  NEUTROABS 9.3*  HGB 12.5  HCT 37.0  MCV 88.4  PLT 231   Cardiac Enzymes: No results for input(s): CKTOTAL, CKMB, CKMBINDEX, TROPONINI in the last 168 hours.  BNP (last 3 results) No results for input(s): BNP in the last 8760 hours.  ProBNP (last 3  results) No results for input(s): PROBNP in the last 8760 hours.  CBG: No results for input(s): GLUCAP in the last 168 hours.  Radiological Exams on Admission: Dg Chest Portable 1 View  Result Date: 09/18/2016 CLINICAL DATA:  Pneumonia EXAM: PORTABLE CHEST 1 VIEW COMPARISON:  06/12/2016 FINDINGS: Lungs are under aerated with bibasilar atelectasis. Normal heart size. Upper lungs clear. No pneumothorax. IMPRESSION: Low volumes and bibasilar atelectasis. Electronically Signed   By: Jolaine Click M.D.   On: 09/18/2016 10:44    EKG: Independently reviewed.  Assessment/Plan Principal Problem:   Influenza A Active Problems:   Acute respiratory insufficiency   Abnormal CXR   AKI (acute kidney injury) (HCC)   Dehydration   Will admit to floor with IV  fluids, IV ABX, Tamiflu, and SVN's. Wean O2 as tolerated. DNR per family. CSW for disposition  Diet: soft Fluids: NS@75  DVT Prophylaxis: SQ Heparin  Code Status: DNR  Family Communication: yes  Disposition Plan: SNF  Time spent: 55 min

## 2016-09-18 NOTE — ED Triage Notes (Signed)
Per EMS report pt from [facility] and they did xray and the nurse read xray and said she had PNA and so called EMS. sats were in mid 80 room air on EMS arrival. On 2 L [place] on arrival.

## 2016-09-18 NOTE — ED Notes (Signed)
In and out cath by megan rn with this rn at bedside. Sterile technique maintained. Pt tolerated well

## 2016-09-19 LAB — COMPREHENSIVE METABOLIC PANEL
ALBUMIN: 2.8 g/dL — AB (ref 3.5–5.0)
ALT: 8 U/L — ABNORMAL LOW (ref 14–54)
ANION GAP: 5 (ref 5–15)
AST: 20 U/L (ref 15–41)
Alkaline Phosphatase: 54 U/L (ref 38–126)
BILIRUBIN TOTAL: 0.3 mg/dL (ref 0.3–1.2)
BUN: 34 mg/dL — ABNORMAL HIGH (ref 6–20)
CHLORIDE: 111 mmol/L (ref 101–111)
CO2: 25 mmol/L (ref 22–32)
Calcium: 8 mg/dL — ABNORMAL LOW (ref 8.9–10.3)
Creatinine, Ser: 1.59 mg/dL — ABNORMAL HIGH (ref 0.44–1.00)
GFR calc Af Amer: 30 mL/min — ABNORMAL LOW (ref >60)
GFR, EST NON AFRICAN AMERICAN: 26 mL/min — AB (ref >60)
Glucose, Bld: 92 mg/dL (ref 65–99)
Potassium: 4 mmol/L (ref 3.5–5.1)
Sodium: 141 mmol/L (ref 135–145)
TOTAL PROTEIN: 6 g/dL — AB (ref 6.5–8.1)

## 2016-09-19 LAB — URINE CULTURE: Culture: NO GROWTH

## 2016-09-19 LAB — CBC
HEMATOCRIT: 30.6 % — AB (ref 35.0–47.0)
HEMOGLOBIN: 10.3 g/dL — AB (ref 12.0–16.0)
MCH: 29.8 pg (ref 26.0–34.0)
MCHC: 33.8 g/dL (ref 32.0–36.0)
MCV: 88.4 fL (ref 80.0–100.0)
Platelets: 165 10*3/uL (ref 150–440)
RBC: 3.46 MIL/uL — ABNORMAL LOW (ref 3.80–5.20)
RDW: 13.9 % (ref 11.5–14.5)
WBC: 5.9 10*3/uL (ref 3.6–11.0)

## 2016-09-19 MED ORDER — LEVOFLOXACIN IN D5W 750 MG/150ML IV SOLN
750.0000 mg | Freq: Once | INTRAVENOUS | Status: AC
  Administered 2016-09-19: 750 mg via INTRAVENOUS
  Filled 2016-09-19: qty 150

## 2016-09-19 MED ORDER — LEVOFLOXACIN IN D5W 500 MG/100ML IV SOLN
500.0000 mg | INTRAVENOUS | Status: DC
  Filled 2016-09-19: qty 100

## 2016-09-19 MED ORDER — VANCOMYCIN 50 MG/ML ORAL SOLUTION
125.0000 mg | Freq: Four times a day (QID) | ORAL | Status: DC
  Administered 2016-09-19 – 2016-09-20 (×5): 125 mg via ORAL
  Filled 2016-09-19 (×8): qty 2.5

## 2016-09-19 NOTE — Progress Notes (Signed)
Physical Therapy Evaluation Patient Details Name: Melissa Cooper L Croucher MRN: [id number] DOB: 04/22/1918 Today's Date: 09/19/2016   History of Present Illness  Patient is a 83 y.o. female [facility] on 20 JAN [facility] with Influenza A and early pneumonia. She was also found to be dehydrated, leading to AKI. PMH includes CKD, HTN, and dementia. Patient is HOH.  Clinical Impression  Patient is a pleasant 83 y.o. Female [facility] for above listed reasons. Patient's son at bedside, explaining that patient has been non-ambulatory for >1 year and that her baseline level of function is transferring via hoyer lift to manual w/c. Upon evaluation, patient requires moderate assistance for rolling and is unable to complete supine to sit transfer due to increased coughing with activity. Patient will continue to benefit from skilled and progressive PT to improve functional strength to allow for improved transfer ability upon discharge back to SNF when medically appropriate.    Follow Up Recommendations SNF    Equipment Recommendations  None recommended by PT    Recommendations for Other Services       Precautions / Restrictions Precautions Precautions: Fall Restrictions Weight Bearing Restrictions: No      Mobility  Bed Mobility Overal bed mobility: Needs Assistance Bed Mobility: Rolling Rolling: Mod assist         General bed mobility comments: Patient requires moderate assistance for rolling. Attempted supine to sit at EOB; however, patient could not stop coughing with activity, so task deferred.  Transfers                    Ambulation/Gait                Stairs            Wheelchair Mobility    Modified Rankin (Stroke Patients Only)       Balance                                             Pertinent Vitals/Pain Pain Assessment: No/denies pain    Home Living Family/patient expects to be discharged to:: Skilled  nursing facility                      Prior Function Level of Independence: Needs assistance   Gait / Transfers Assistance Needed: Patient previously transferred from bed to manual w/c with hoyer lift. Per son at bedside, patient has been non-ambulatory for ~1 year since admmitted with C. Diff.  ADL's / Homemaking Assistance Needed: Total assistance; patient can feed self        Hand Dominance        Extremity/Trunk Assessment   Upper Extremity Assessment Upper Extremity Assessment: Generalized weakness    Lower Extremity Assessment Lower Extremity Assessment: Generalized weakness;Difficult to assess due to impaired cognition       Communication   Communication: HOH  Cognition Arousal/Alertness: Awake/alert Behavior During Therapy: WFL for tasks assessed/performed Overall Cognitive Status: History of cognitive impairments - at baseline                 General Comments: Oriented x2; however, patient forgot that she was at hospital by conclusion of PT session.    General Comments      Exercises     Assessment/Plan    PT Assessment Patient needs continued PT services  PT  Problem List Decreased strength;Decreased range of motion;Decreased activity tolerance;Decreased balance;Decreased mobility;Decreased cognition          PT Treatment Interventions Functional mobility training;Therapeutic activities;Therapeutic exercise;Balance training;Cognitive remediation;Patient/family education;Wheelchair mobility training    PT Goals (Current goals can be found in the Care Plan section)  Acute Rehab PT Goals Patient Stated Goal: Unstated PT Goal Formulation: With patient/family Time For Goal Achievement: 10/03/16 Potential to Achieve Goals: Fair    Frequency Min 2X/week   Barriers to discharge        Co-evaluation               End of Session Equipment Utilized During Treatment: Oxygen Activity Tolerance: Patient tolerated treatment  well;Patient limited by fatigue Patient left: in bed;with call bell/phone within reach;with bed alarm set;with family/visitor present           Time: 1610-9604 PT Time Calculation (min) (ACUTE ONLY): 18 min   Charges:   PT Evaluation $PT Eval Low Complexity: 1 Procedure     PT G Codes:        Neita Carp, PT, DPT 09/19/2016, 2:12 PM

## 2016-09-19 NOTE — Clinical Social Work Note (Signed)
Clinical Social Work Assessment  Patient Details  Name: Melissa Cooper MRN: [id number] Date of Birth: 06-Feb-1918  Date of referral:  09/19/16               Reason for consult:  Facility Placement                Permission sought to share information with:  Facility Art therapist granted to share information::  Yes, Verbal Permission Granted  Name::        Agency::     Relationship::     Contact Information:     Housing/Transportation Living arrangements for the past 2 months:  [place] of Information:  Adult Children Patient Interpreter Needed:  None Criminal Activity/Legal Involvement Pertinent to Current Situation/Hospitalization:  No - Comment as needed Significant Relationships:  Adult Children Lives with:  Facility Resident Do you feel safe going back to the place where you live?  Yes Need for family participation in patient care:  Yes (Comment)  Care giving concerns:  Patient admitted from [place] Worker assessment / plan:  CSW met with patient and her son, [name], at bedside to discuss dc planning. The patient is a LTC resident, and [name] reported that the plan is for her to return when stable. CSW attempted to contact [facility] to confirm with no answer at the facility.  At baseline, the patient is pleasantly confused. She does not ambulate. PT rec is for SNF.  Employment status:  Retired Forensic scientist:  Medicare PT Recommendations:    Information / Referral to community resources:     Patient/Family's Response to care:  [name] thanked the CSW for assistance.  Patient/Family's Understanding of and Emotional Response to Diagnosis, Current Treatment, and Prognosis: The patient's son was in particularly good humor, and her family is quite involved with her care.   Emotional Assessment Appearance:  Appears younger than stated age Attitude/Demeanor/Rapport:  Lethargic Affect (typically observed):   Accepting Orientation:  Oriented to Self Alcohol / Substance use:  Never Used Psych involvement (Current and /or in the community):  No (Comment)  Discharge Needs  Concerns to be addressed:  Care Coordination Readmission within the last 30 days:  No Current discharge risk:  Chronically ill Barriers to Discharge:  Continued Medical Work up   [employer], LCSW 09/19/2016, 3:06 PM

## 2016-09-19 NOTE — Progress Notes (Signed)
[facility] - [place] at [facility]   PATIENT NAME: Melissa Flakesllene Cooper    MR#:  [id number]  DATE OF BIRTH:  05/05/1918  SUBJECTIVE:   Patient is slightly hard of hearing. She reports cough and increasing shortness of breath. She has very weak. She denies chest pain.  REVIEW OF SYSTEMS:    Review of Systems  Constitutional: Positive for malaise/fatigue. Negative for chills and fever.  HENT: Positive for hearing loss. Negative for ear discharge, ear pain, nosebleeds and sore throat.   Eyes: Negative.  Negative for blurred vision and pain.  Respiratory: Positive for cough and shortness of breath. Negative for hemoptysis and wheezing.   Cardiovascular: Negative.  Negative for chest pain, palpitations and leg swelling.  Gastrointestinal: Negative.  Negative for abdominal pain, blood in stool, diarrhea, nausea and vomiting.  Genitourinary: Negative.  Negative for dysuria.  Musculoskeletal: Negative.  Negative for back pain.  Skin: Negative.   Neurological: Positive for weakness. Negative for dizziness, tremors, speech change, focal weakness, seizures and headaches.  Endo/Heme/Allergies: Negative.  Does not bruise/bleed easily.  Psychiatric/Behavioral: Negative.  Negative for depression, hallucinations and suicidal ideas.    Tolerating Diet: yes      DRUG ALLERGIES:   Allergies  Allergen Reactions  . Actonel [Risedronate Sodium] Other (See Comments)    Unknown reaction  . Ceftin [Cefuroxime Axetil] Other (See Comments)    Unknown reaction  . Evista [Raloxifene] Other (See Comments)    Unknown reaction  . Miacalcin [Calcitonin ([place])] Other (See Comments)    Unknown reaction  . Zyrtec [Cetirizine] Other (See Comments)    Unknown reaction    VITALS:  Blood pressure 140/68, pulse 84, temperature 97.8 F (36.6 C), temperature source Axillary, resp. rate 16, height 5\' 2"  (1.575 m), weight 52.6 kg (116 lb), SpO2 90 %.  PHYSICAL EXAMINATION:   Physical Exam   Constitutional: She is well-developed, well-nourished, and in no distress. No distress.  HENT:  Head: Normocephalic.  Eyes: No scleral icterus.  Neck: Normal range of motion. Neck supple. No JVD present. No tracheal deviation present.  Cardiovascular: Normal rate, regular rhythm and normal heart sounds.  Exam reveals no gallop and no friction rub.   No murmur heard. tachycardic  Pulmonary/Chest: Effort normal. No respiratory distress. She has wheezes. She has no rales. She exhibits no tenderness.  rhonchii b/l coarse Bs  Abdominal: Soft. Bowel sounds are normal. She exhibits no distension and no mass. There is no tenderness. There is no rebound and no guarding.  Musculoskeletal: Normal range of motion. She exhibits no edema.  Neurological: She is alert.  Skin: Skin is warm. No rash noted. No erythema.  Psychiatric: Affect normal.      LABORATORY PANEL:   CBC  Recent Labs Lab 09/19/16 0438  WBC 5.9  HGB 10.3*  HCT 30.6*  PLT 165   ------------------------------------------------------------------------------------------------------------------  Chemistries   Recent Labs Lab 09/19/16 0438  NA 141  K 4.0  CL 111  CO2 25  GLUCOSE 92  BUN 34*  CREATININE 1.59*  CALCIUM 8.0*  AST 20  ALT 8*  ALKPHOS 54  BILITOT 0.3   ------------------------------------------------------------------------------------------------------------------  Cardiac Enzymes No results for input(s): TROPONINI in the last 168 hours. ------------------------------------------------------------------------------------------------------------------  RADIOLOGY:  Dg Chest Portable 1 View  Result Date: 09/18/2016 CLINICAL DATA:  Pneumonia EXAM: PORTABLE CHEST 1 VIEW COMPARISON:  06/12/2016 FINDINGS: Lungs are under aerated with bibasilar atelectasis. Normal heart size. Upper lungs clear. No pneumothorax. IMPRESSION: Low volumes and bibasilar atelectasis. Electronically Signed  By: Jolaine Click  M.D.   On: 09/18/2016 10:44     ASSESSMENT AND PLAN:    83 year old female with a history of chronic kidney disease stage III and dementia who presents from [facility] [place] with fever and cough.  1. Acute hypoxic respiratory failure in the setting of influenza and pneumonia: Wean oxygen as tolerated Continue ISS if tolerated  Continue antibiotics with Levaquin for pneumonia Continue Tamiflu for influenza A  2. Influenza A: Patient will need 5 days of Tamiflu. Today is day 2 of 5  3. Pneumonia: Continue Levaquin  4. Acute on Chronic kidney disease stage III: Creatinine has improved with IV fluids Stop IV fluids for now due to current lung sounds  5. Essential hypertension: Continue metoprolol  6. C. difficile: Start vancomycin  PT eval  Consider palliative care if no improvement will d/w patient's son.      Management plans discussed with the patient and left message for son.  CODE STATUS: DNR  TOTAL TIME TAKING CARE OF THIS PATIENT: 30 minutes.     POSSIBLE D/C ??, DEPENDING ON CLINICAL CONDITION.   Henslee Lottman M.D on 09/19/2016 at 8:20 AM  Between 7am to 6pm - Pager - [phone] After 6pm go to [email or web address] - password [email or web address]  [facility]  Office  [phone]  CC: Primary care physician; Tillman Abide, MD  Note: This dictation was prepared with Dragon dictation along with smaller phrase technology. Any transcriptional errors that result from this process are unintentional.

## 2016-09-19 NOTE — Progress Notes (Signed)
Pharmacy Antibiotic Note  Panayiota L Chestine SporeClark is a 83 y.o. female admitted on 09/18/2016 with pneumonia and C diff.  Pharmacy has been consulted for levaquin and vancomycin dosing. Also on Tamiflu.   Plan: Levaquin 750 mg IV x1 then 500 mg IV q48h based on current renal function.  Vancomycin 125 mg PO q6h for C diff.   Height: 5\' 2"  (157.5 cm) Weight: 116 lb (52.6 kg) IBW/kg (Calculated) : 50.1  Temp (24hrs), Avg:98.6 F (37 C), Min:98.2 F (36.8 C), Max:99.2 F (37.3 C)   Recent Labs Lab 09/18/16 0940 09/18/16 0947 09/19/16 0438  WBC 11.5*  --  5.9  CREATININE 1.83*  --  1.59*  LATICACIDVEN  --  1.3  --     Estimated Creatinine Clearance: 16.4 mL/min (by C-G formula based on SCr of 1.59 mg/dL (H)).    Allergies  Allergen Reactions  . Actonel [Risedronate Sodium] Other (See Comments)    Unknown reaction  . Ceftin [Cefuroxime Axetil] Other (See Comments)    Unknown reaction  . Evista [Raloxifene] Other (See Comments)    Unknown reaction  . Miacalcin [Calcitonin ([place])] Other (See Comments)    Unknown reaction  . Zyrtec [Cetirizine] Other (See Comments)    Unknown reaction    Antimicrobials this admission: Levaquin 1/21 >> PO vanc 1/21 >> Tamiflu 1/20 >>  Dose adjustments this admission:   Microbiology results: 1/20 BCx: NGTD Influenza A + MRSA PCR: + Cdiff +  Thank you for allowing pharmacy to be a part of this patient's care.  Marty HeckWang, Wilgus Deyton L 09/19/2016 8:04 AM

## 2016-09-20 LAB — BASIC METABOLIC PANEL
Anion gap: 8 (ref 5–15)
BUN: 29 mg/dL — AB (ref 6–20)
CHLORIDE: 109 mmol/L (ref 101–111)
CO2: 23 mmol/L (ref 22–32)
CREATININE: 1.44 mg/dL — AB (ref 0.44–1.00)
Calcium: 8.3 mg/dL — ABNORMAL LOW (ref 8.9–10.3)
GFR calc Af Amer: 34 mL/min — ABNORMAL LOW (ref >60)
GFR calc non Af Amer: 30 mL/min — ABNORMAL LOW (ref >60)
GLUCOSE: 96 mg/dL (ref 65–99)
POTASSIUM: 3.8 mmol/L (ref 3.5–5.1)
Sodium: 140 mmol/L (ref 135–145)

## 2016-09-20 MED ORDER — LEVOFLOXACIN 500 MG PO TABS: 500.0000 mg | ORAL_TABLET | ORAL | 0 refills | Status: AC

## 2016-09-20 MED ORDER — OSELTAMIVIR PHOSPHATE 30 MG PO CAPS: 30.0000 mg | ORAL_CAPSULE | Freq: Every day | ORAL | 0 refills | Status: AC

## 2016-09-20 MED ORDER — IPRATROPIUM-ALBUTEROL 0.5-2.5 (3) MG/3ML IN SOLN: 3.0000 mL | RESPIRATORY_TRACT | Status: DC | PRN

## 2016-09-20 MED ORDER — VANCOMYCIN 50 MG/ML ORAL SOLUTION: 125.0000 mg | Freq: Four times a day (QID) | ORAL | 0 refills | Status: AC

## 2016-09-20 NOTE — NC FL2 (Signed)
[place] MEDICAID FL2 LEVEL OF CARE SCREENING TOOL     IDENTIFICATION  Patient Name: Melissa Cooper Birthdate: [birth year] Sex: female Admission Date (Current Location): 09/18/2016  [facility] and [place] Number:  [address]  ([id number]) Facility and Address:  [facility], [address], [place], [place] [address]      Provider Number: [id number]  Attending Physician Name and Address:  Adrian Saran, MD  Relative Name and Phone Number:       Current Level of Care: Hospital Recommended Level of Care: Skilled Nursing Facility Prior Approval Number:    Date Approved/Denied:   PASRR Number:  ([id number] )  Discharge Plan: SNF    Current Diagnoses: Patient Active Problem List   Diagnosis Date Noted  . Influenza A 09/18/2016  . Acute respiratory insufficiency 09/18/2016  . Abnormal CXR 09/18/2016  . AKI (acute kidney injury) (HCC) 09/18/2016  . Dehydration 09/18/2016  . Hip fracture (HCC) 06/12/2016  . Protein-calorie malnutrition, severe 09/30/2015  . Recurrent colitis due to Clostridium difficile 09/28/2015  . Clostridium difficile colitis 09/05/2015  . Pressure ulcer 08/30/2015  . Sepsis (HCC) 08/29/2015    Orientation RESPIRATION BLADDER Height & Weight     Self, Time, Place  O2 (2 Liters Oxygen ) Incontinent Weight: 117 lb 8 oz (53.3 kg) Height:  5\' 2"  (157.5 cm)  BEHAVIORAL SYMPTOMS/MOOD NEUROLOGICAL BOWEL NUTRITION STATUS   (none)  (none) Incontinent Diet (Diet: Soft )  AMBULATORY STATUS COMMUNICATION OF NEEDS Skin   Extensive Assist Verbally Normal                       Personal Care Assistance Level of Assistance  Bathing, Feeding, Dressing Bathing Assistance: Limited assistance Feeding assistance: Independent Dressing Assistance: Limited assistance     Functional Limitations Info  Sight, Hearing, Speech Sight Info: Adequate Hearing Info: Impaired Speech Info: Adequate    SPECIAL CARE FACTORS FREQUENCY                       Contractures      Additional Factors Info  Code Status, Allergies, Isolation Precautions Code Status Info:  (DNR ) Allergies Info:  (Actonel Risedronate Sodium, Ceftin Cefuroxime Axetil, Evista, Raloxifene, Miacalcin Calcitonin ([place]), Zyrtec Cetirizine)     Isolation Precautions Info:  (Positive for the Flu)     Current Medications (09/20/2016):  This is the current hospital active medication list Current Facility-Administered Medications  Medication Dose Route Frequency Provider Last Rate Last Dose  . acetaminophen (TYLENOL) tablet 650 mg  650 mg Oral Q6H PRN Marguarite Arbour, MD       Or  . acetaminophen (TYLENOL) suppository 650 mg  650 mg Rectal Q6H PRN Marguarite Arbour, MD      . benzonatate (TESSALON) capsule 100 mg  100 mg Oral TID PRN Marguarite Arbour, MD   100 mg at 09/19/16 1213  . bisacodyl (DULCOLAX) suppository 10 mg  10 mg Rectal Daily PRN Marguarite Arbour, MD      . Chlorhexidine Gluconate Cloth 2 % PADS 6 each  6 each Topical Q0600 Marguarite Arbour, MD   6 each at 09/20/16 [phone]  . docusate sodium (COLACE) capsule 100 mg  100 mg Oral BID Marguarite Arbour, MD   100 mg at 09/19/16 0920  . docusate sodium (COLACE) capsule 100 mg  100 mg Oral BID Marguarite Arbour, MD   100 mg at 09/19/16 0102  . feeding supplement (ENSURE ENLIVE) (ENSURE  ENLIVE) liquid 237 mL  1 Bottle Oral BID Marguarite ArbourJeffrey D Sparks, MD   237 mL at 09/20/16 0946  . ferrous sulfate tablet 325 mg  325 mg Oral Daily Marguarite ArbourJeffrey D Sparks, MD   325 mg at 09/20/16 0946  . guaifenesin (ROBITUSSIN) 100 MG/5ML syrup 200 mg  200 mg Oral Q4H PRN Marguarite ArbourJeffrey D Sparks, MD      . guaiFENesin-dextromethorphan [facility](ROBITUSSIN DM) 100-10 MG/5ML syrup 10 mL  10 mL Oral BID Marguarite ArbourJeffrey D Sparks, MD   10 mL at 09/20/16 0947  . heparin injection 5,000 Units  5,000 Units Subcutaneous Q8H Marguarite ArbourJeffrey D Sparks, MD   5,000 Units at 09/20/16 1313  . ipratropium-albuterol (DUONEB) 0.5-2.5 (3) MG/3ML nebulizer solution 3 mL  3 mL Nebulization Q4H  PRN Adrian SaranSital Mody, MD      . Melene Muller[START ON 09/21/2016] levofloxacin (LEVAQUIN) IVPB 500 mg  500 mg Intravenous Q48H Sital Mody, MD      . metoprolol tartrate (LOPRESSOR) tablet 25 mg  25 mg Oral TID Marguarite ArbourJeffrey D Sparks, MD   25 mg at 09/20/16 0946  . mupirocin ointment (BACTROBAN) 2 % 1 application  1 application Nasal BID Marguarite ArbourJeffrey D Sparks, MD   1 application at 09/20/16 [phone]  . ondansetron (ZOFRAN) tablet 4 mg  4 mg Oral Q6H PRN Marguarite ArbourJeffrey D Sparks, MD       Or  . ondansetron [facility](ZOFRAN) injection 4 mg  4 mg Intravenous Q6H PRN Marguarite ArbourJeffrey D Sparks, MD      . oseltamivir (TAMIFLU) capsule 30 mg  30 mg Oral Daily Marguarite ArbourJeffrey D Sparks, MD   30 mg at 09/20/16 0946  . pantoprazole (PROTONIX) EC tablet 40 mg  40 mg Oral Daily Marguarite ArbourJeffrey D Sparks, MD   40 mg at 09/20/16 0946  . polyethylene glycol (MIRALAX / GLYCOLAX) packet 17 g  17 g Oral Daily PRN Marguarite ArbourJeffrey D Sparks, MD      . traMADol Janean Sark(ULTRAM) tablet 50 mg  50 mg Oral Q6H PRN Marguarite ArbourJeffrey D Sparks, MD      . vancomycin (VANCOCIN) 50 mg/mL oral solution 125 mg  125 mg Oral Q6H Adrian SaranSital Mody, MD   125 mg at 09/20/16 1207  . vitamin B-12 (CYANOCOBALAMIN) tablet 1,000 mcg  1,000 mcg Oral Daily Marguarite ArbourJeffrey D Sparks, MD   1,000 mcg at 09/20/16 [id number]     Discharge Medications: Please see discharge summary for a list of discharge medications.  Relevant Imaging Results:  Relevant Lab Results:   Additional Information  (SSN: [id number])  Melysa Schroyer, Darleen CrockerBailey M, LCSW

## 2016-09-20 NOTE — Progress Notes (Signed)
Family Meeting Note  Advance Directive:yes  Today a meeting took place with the patient's son.    The following clinical team members were present during this meeting:MD  The following were discussed:Patient's diagnosis: Influenza Pneumonia Dementia  Patient's progosis: < 6 months and Goals for treatment: DNR  Additional follow-up to be provided: Palliative care/hospice at facility  Time spent during discussion:20 minutes  Chabeli Barsamian, Patricia PesaSITAL, MD

## 2016-09-20 NOTE — Discharge Summary (Signed)
[facility] - [place] at [facility]   PATIENT NAME: Melissa Cooper    MR#:  [id number]  DATE OF BIRTH:  Jul 22, 1918  DATE OF ADMISSION:  09/18/2016 ADMITTING PHYSICIAN: Marguarite Arbour, MD  DATE OF DISCHARGE: 09/20/2016  PRIMARY CARE PHYSICIAN: Tillman Abide, MD    ADMISSION DIAGNOSIS:  Influenza A [J10.1] Acute respiratory failure with hypoxia (HCC) [J96.01] AKI (acute kidney injury) (HCC) [N17.9]  DISCHARGE DIAGNOSIS:  Principal Problem:   Influenza A Active Problems:   Acute respiratory insufficiency   Abnormal CXR   AKI (acute kidney injury) (HCC)   Dehydration   SECONDARY DIAGNOSIS:   Past Medical History:  Diagnosis Date  . CKD (chronic kidney disease)    stage 3  . Dementia   . HTN (hypertension)   . Hypercholesteremia   . OSA (obstructive sleep apnea)   . Osteoarthritis   . Osteoarthritis   . PA (pernicious anemia)     HOSPITAL COURSE:   83 year old female with a history of chronic kidney disease stage III and dementia who presents from [facility] [place] with fever and cough.  1. Acute hypoxic respiratory failure in the setting of influenza and pneumonia: Continue treatment for pneumonia with Levaquin renally dosed to every 48 hours. Continue Tamiflu renally dosed for influenza A  2. Influenza A: Patient will need to complete treatment for 5 days. 3. Pneumonia: Continue Levaquin at discharge.  4. Acute on Chronic kidney disease stage III: Creatinine has improved with IV fluids  5. Essential hypertension: Continue metoprolol  6. C. difficile: Continue vancomycin oral.   DISCHARGE CONDITIONS AND DIET:   Patient would benefit from palliative care consult at skilled nursing facility Discharge in stable condition on soft diet  CONSULTS OBTAINED:    DRUG ALLERGIES:   Allergies  Allergen Reactions  . Actonel [Risedronate Sodium] Other (See Comments)    Unknown reaction  . Ceftin [Cefuroxime Axetil] Other (See Comments)   Unknown reaction  . Evista [Raloxifene] Other (See Comments)    Unknown reaction  . Miacalcin [Calcitonin ([place])] Other (See Comments)    Unknown reaction  . Zyrtec [Cetirizine] Other (See Comments)    Unknown reaction    DISCHARGE MEDICATIONS:   Current Discharge Medication List    START taking these medications   Details  levofloxacin (LEVAQUIN) 500 MG tablet Take 1 tablet (500 mg total) by mouth every other day. Qty: 3 tablet, Refills: 0    oseltamivir (TAMIFLU) 30 MG capsule Take 1 capsule (30 mg total) by mouth daily. Qty: 2 capsule, Refills: 0    vancomycin (VANCOCIN) 50 mg/mL oral solution Take 2.5 mLs (125 mg total) by mouth every 6 (six) hours. Qty: 120 mL, Refills: 0      CONTINUE these medications which have NOT CHANGED   Details  acetaminophen (TYLENOL ARTHRITIS PAIN) 650 MG CR tablet Take 650 mg by mouth 3 (three) times daily.    acidophilus (RISAQUAD) CAPS capsule Take 1 capsule by mouth 2 (two) times daily. Qty: 60 capsule, Refills: 3    benzonatate (TESSALON) 100 MG capsule Take 1 capsule (100 mg total) by mouth 3 (three) times daily as needed for cough. Qty: 20 capsule, Refills: 0    docusate sodium (COLACE) 100 MG capsule Take 1 capsule (100 mg total) by mouth 2 (two) times daily. Qty: 10 capsule, Refills: 0    ferrous sulfate 325 (65 FE) MG tablet Take 325 mg by mouth daily.    metoprolol tartrate (LOPRESSOR) 25 MG tablet Take 1 tablet (25 mg  total) by mouth 3 (three) times daily. HOLD IF HR<60 or SBP<100 Qty: 90 tablet, Refills: 2    Nutritional Supplements (ENSURE CLEAR) LIQD Take 1 Package by mouth 2 (two) times daily. Between meals    omeprazole (PRILOSEC) 20 MG capsule Take 20 mg by mouth every other day.     polyethylene glycol (MIRALAX / GLYCOLAX) packet Take 17 g by mouth daily as needed for mild constipation. Qty: 14 each, Refills: 0    vitamin B-12 1000 MCG tablet Take 1 tablet (1,000 mcg total) by mouth daily. Qty: 30 tablet, Refills:  0      STOP taking these medications     guaifenesin (ROBITUSSIN) 100 MG/5ML syrup      ondansetron (ZOFRAN) 4 MG tablet      traMADol (ULTRAM) 50 MG tablet      enoxaparin (LOVENOX) 30 MG/0.3ML injection               Today   CHIEF COMPLAINT:   Centers at bedside. Patient symptoms have improved. She continues to have cough.   VITAL SIGNS:  Blood pressure (!) 160/76, pulse 87, temperature 98.7 F (37.1 C), temperature source Oral, resp. rate 19, height 5\' 2"  (1.575 m), weight 53.3 kg (117 lb 8 oz), SpO2 94 %.   REVIEW OF SYSTEMS:  Review of Systems  Constitutional: Negative.  Negative for chills, fever and malaise/fatigue.  HENT: Negative.  Negative for ear discharge, ear pain, hearing loss, nosebleeds and sore throat.   Eyes: Negative.  Negative for blurred vision and pain.  Respiratory: Positive for cough. Negative for hemoptysis, shortness of breath and wheezing.   Cardiovascular: Negative.  Negative for chest pain, palpitations and leg swelling.  Gastrointestinal: Negative.  Negative for abdominal pain, blood in stool, diarrhea, nausea and vomiting.  Genitourinary: Negative.  Negative for dysuria.  Musculoskeletal: Negative.  Negative for back pain.  Skin: Negative.   Neurological: Negative for dizziness, tremors, speech change, focal weakness, seizures and headaches.  Endo/Heme/Allergies: Negative.  Does not bruise/bleed easily.  Psychiatric/Behavioral: Positive for memory loss. Negative for depression, hallucinations and suicidal ideas.     PHYSICAL EXAMINATION:  GENERAL:  83 y.o.-year-old patient lying in the bed with no acute distress.  NECK:  Supple, no jugular venous distention. No thyroid enlargement, no tenderness.  LUNGS: Bilateral rhonchorous breath sounds without wheezing, rales,rhonchi  No use of accessory muscles of respiration.  CARDIOVASCULAR: S1, S2 normal. No murmurs, rubs, or gallops.  ABDOMEN: Soft, non-tender, non-distended. Bowel  sounds present. No organomegaly or mass.  EXTREMITIES: No pedal edema, cyanosis, or clubbing.  PSYCHIATRIC: The patient is alert and oriented x name  SKIN: No obvious rash, lesion, or ulcer.   DATA REVIEW:   CBC  Recent Labs Lab 09/19/16 0438  WBC 5.9  HGB 10.3*  HCT 30.6*  PLT 165    Chemistries   Recent Labs Lab 09/19/16 0438 09/20/16 0606  NA 141 140  K 4.0 3.8  CL 111 109  CO2 25 23  GLUCOSE 92 96  BUN 34* 29*  CREATININE 1.59* 1.44*  CALCIUM 8.0* 8.3*  AST 20  --   ALT 8*  --   ALKPHOS 54  --   BILITOT 0.3  --     Cardiac Enzymes No results for input(s): TROPONINI in the last 168 hours.  Microbiology Results  @MICRORSLT48 @  RADIOLOGY:  Dg Chest Portable 1 View  Result Date: 09/18/2016 CLINICAL DATA:  Pneumonia EXAM: PORTABLE CHEST 1 VIEW COMPARISON:  06/12/2016 FINDINGS: Lungs are under  aerated with bibasilar atelectasis. Normal heart size. Upper lungs clear. No pneumothorax. IMPRESSION: Low volumes and bibasilar atelectasis. Electronically Signed   By: Jolaine Click M.D.   On: 09/18/2016 10:44      Management plans discussed with the patient's son and he is in agreement. Stable for discharge SNF  Patient should follow up with pcp  CODE STATUS:     Code Status Orders        Start     Ordered   09/18/16 1501  Do not attempt resuscitation (DNR)  Continuous    Question Answer Comment  In the event of cardiac or respiratory ARREST Do not call a "code blue"   In the event of cardiac or respiratory ARREST Do not perform Intubation, CPR, defibrillation or ACLS   In the event of cardiac or respiratory ARREST Use medication by any route, position, wound care, and other measures to relive pain and suffering. May use oxygen, suction and manual treatment of airway obstruction as needed for comfort.      09/18/16 1500    Code Status History    Date Active Date Inactive Code Status Order ID Comments User Context   06/12/2016 12:02 PM 06/16/2016  1:23  AM DNR [id number]  Enedina Finner, MD Inpatient   06/12/2016  9:45 AM 06/12/2016 12:01 PM Full Code [id number]  Enedina Finner, MD Inpatient   09/28/2015  1:22 PM 10/06/2015  3:14 PM DNR [id number]  Enid Baas, MD Inpatient   08/29/2015  6:35 PM 09/05/2015  6:35 PM DNR [id number]  Enid Baas, MD Inpatient    Advance Directive Documentation   Flowsheet Row Most Recent Value  Type of Advance Directive  Healthcare Power of Attorney  Pre-existing out of facility DNR order (yellow form or pink MOST form)  No data  "MOST" Form in Place?  No data      TOTAL TIME TAKING CARE OF THIS PATIENT: 37 minutes.    Note: This dictation was prepared with Dragon dictation along with smaller phrase technology. Any transcriptional errors that result from this process are unintentional.  Juwann Sherk M.D on 09/20/2016 at 10:34 AM  Between 7am to 6pm - Pager - [phone] After 6pm go to [email or web address] - Social research officer, government  [facility]  Office  [phone]  CC: Primary care physician; Tillman Abide, MD

## 2016-09-20 NOTE — Progress Notes (Signed)
Patient is medically stable for D/C back [facility] today. Per Sue LushAndrea admissions coordinator at [facility] patient will go to room 302-A. RN will call report at [phone] and arrange EMS for transport. [name] is aware that patient has the flu. Clinical Child psychotherapistocial Worker (CSW) sent D/C orders to [facility] [employer] via [employer]. Patient is aware of above. Patient's son Meredith StaggersWes is at bedside and aware of D/C today. Please reconsult if future social work needs arise. CSW signing off.  Baker Hughes IncorporatedBailey Dianna Ewald, LCSW [phone]

## 2016-09-20 NOTE — Progress Notes (Signed)
RN gave report to RN at [facility]. All questions answered. Information in packet. Patient alert and oriented to self per baseline. EMS called for transport.  Harvie HeckMelanie Murrel Freet, RN

## 2016-09-20 NOTE — NC FL2 (Signed)
[place] MEDICAID FL2 LEVEL OF CARE SCREENING TOOL     IDENTIFICATION  Patient Name: Melissa Cooper Birthdate: [birth year] Sex: female Admission Date (Current Location): 09/18/2016  [facility] and [place] Number:  [address]  ([id number]) Facility and Address:  [facility], [address], [place], [place] [address]      Provider Number: [id number]  Attending Physician Name and Address:  Adrian Saran, MD  Relative Name and Phone Number:       Current Level of Care: Hospital Recommended Level of Care: Skilled Nursing Facility Prior Approval Number:    Date Approved/Denied:   PASRR Number:  ([id number] )  Discharge Plan: SNF    Current Diagnoses: Patient Active Problem List   Diagnosis Date Noted  . Influenza A 09/18/2016  . Acute respiratory insufficiency 09/18/2016  . Abnormal CXR 09/18/2016  . AKI (acute kidney injury) (HCC) 09/18/2016  . Dehydration 09/18/2016  . Hip fracture (HCC) 06/12/2016  . Protein-calorie malnutrition, severe 09/30/2015  . Recurrent colitis due to Clostridium difficile 09/28/2015  . Clostridium difficile colitis 09/05/2015  . Pressure ulcer 08/30/2015  . Sepsis (HCC) 08/29/2015    Orientation RESPIRATION BLADDER Height & Weight     Self, Time, Place  O2 (2 Liters Oxygen ) Incontinent Weight: 117 lb 8 oz (53.3 kg) Height:  5\' 2"  (157.5 cm)  BEHAVIORAL SYMPTOMS/MOOD NEUROLOGICAL BOWEL NUTRITION STATUS   (none)  (none) Incontinent Diet (Diet: Soft )  AMBULATORY STATUS COMMUNICATION OF NEEDS Skin   Extensive Assist Verbally Normal                       Personal Care Assistance Level of Assistance  Bathing, Feeding, Dressing Bathing Assistance: Limited assistance Feeding assistance: Independent Dressing Assistance: Limited assistance     Functional Limitations Info  Sight, Hearing, Speech Sight Info: Adequate Hearing Info: Impaired Speech Info: Adequate    SPECIAL CARE FACTORS FREQUENCY                       Contractures      Additional Factors Info  Code Status, Allergies, Isolation Precautions Code Status Info:  (DNR ) Allergies Info:  (Actonel Risedronate Sodium, Ceftin Cefuroxime Axetil, Evista, Raloxifene, Miacalcin Calcitonin (Salmon), Zyrtec Cetirizine)     Isolation Precautions Info:  (Positive for the Flu)     Current Medications (09/20/2016):  This is the current hospital active medication list Current Facility-Administered Medications  Medication Dose Route Frequency Provider Last Rate Last Dose  . acetaminophen (TYLENOL) tablet 650 mg  650 mg Oral Q6H PRN Marguarite Arbour, MD       Or  . acetaminophen (TYLENOL) suppository 650 mg  650 mg Rectal Q6H PRN Marguarite Arbour, MD      . benzonatate (TESSALON) capsule 100 mg  100 mg Oral TID PRN Marguarite Arbour, MD   100 mg at 09/19/16 1213  . bisacodyl (DULCOLAX) suppository 10 mg  10 mg Rectal Daily PRN Marguarite Arbour, MD      . Chlorhexidine Gluconate Cloth 2 % PADS 6 each  6 each Topical Q0600 Marguarite Arbour, MD   6 each at 09/20/16 [phone]  . docusate sodium (COLACE) capsule 100 mg  100 mg Oral BID Marguarite Arbour, MD   100 mg at 09/19/16 0920  . docusate sodium (COLACE) capsule 100 mg  100 mg Oral BID Marguarite Arbour, MD   100 mg at 09/19/16 0102  . feeding supplement (ENSURE ENLIVE) (ENSURE  ENLIVE) liquid 237 mL  1 Bottle Oral BID Marguarite ArbourJeffrey D Sparks, MD   237 mL at 09/20/16 0946  . ferrous sulfate tablet 325 mg  325 mg Oral Daily Marguarite ArbourJeffrey D Sparks, MD   325 mg at 09/20/16 0946  . guaifenesin (ROBITUSSIN) 100 MG/5ML syrup 200 mg  200 mg Oral Q4H PRN Marguarite ArbourJeffrey D Sparks, MD      . guaiFENesin-dextromethorphan [facility](ROBITUSSIN DM) 100-10 MG/5ML syrup 10 mL  10 mL Oral BID Marguarite ArbourJeffrey D Sparks, MD   10 mL at 09/20/16 0947  . heparin injection 5,000 Units  5,000 Units Subcutaneous Q8H Marguarite ArbourJeffrey D Sparks, MD   5,000 Units at 09/20/16 1313  . ipratropium-albuterol (DUONEB) 0.5-2.5 (3) MG/3ML nebulizer solution 3 mL  3 mL Nebulization Q4H  PRN Adrian SaranSital Mody, MD      . Melene Muller[START ON 09/21/2016] levofloxacin (LEVAQUIN) IVPB 500 mg  500 mg Intravenous Q48H Sital Mody, MD      . metoprolol tartrate (LOPRESSOR) tablet 25 mg  25 mg Oral TID Marguarite ArbourJeffrey D Sparks, MD   25 mg at 09/20/16 0946  . mupirocin ointment (BACTROBAN) 2 % 1 application  1 application Nasal BID Marguarite ArbourJeffrey D Sparks, MD   1 application at 09/20/16 [phone]  . ondansetron (ZOFRAN) tablet 4 mg  4 mg Oral Q6H PRN Marguarite ArbourJeffrey D Sparks, MD       Or  . ondansetron [facility](ZOFRAN) injection 4 mg  4 mg Intravenous Q6H PRN Marguarite ArbourJeffrey D Sparks, MD      . oseltamivir (TAMIFLU) capsule 30 mg  30 mg Oral Daily Marguarite ArbourJeffrey D Sparks, MD   30 mg at 09/20/16 0946  . pantoprazole (PROTONIX) EC tablet 40 mg  40 mg Oral Daily Marguarite ArbourJeffrey D Sparks, MD   40 mg at 09/20/16 0946  . polyethylene glycol (MIRALAX / GLYCOLAX) packet 17 g  17 g Oral Daily PRN Marguarite ArbourJeffrey D Sparks, MD      . traMADol Janean Sark(ULTRAM) tablet 50 mg  50 mg Oral Q6H PRN Marguarite ArbourJeffrey D Sparks, MD      . vancomycin (VANCOCIN) 50 mg/mL oral solution 125 mg  125 mg Oral Q6H Adrian SaranSital Mody, MD   125 mg at 09/20/16 1207  . vitamin B-12 (CYANOCOBALAMIN) tablet 1,000 mcg  1,000 mcg Oral Daily Marguarite ArbourJeffrey D Sparks, MD   1,000 mcg at 09/20/16 [id number]     Discharge Medications: Please see discharge summary for a list of discharge medications.  Relevant Imaging Results:  Relevant Lab Results:   Additional Information  (SSN: [id number])  Virdell Hoiland, Darleen CrockerBailey M, LCSW

## 2016-09-22 ENCOUNTER — Telehealth: Payer: Self-pay

## 2016-09-22 DIAGNOSIS — J111 Influenza due to unidentified influenza virus with other respiratory manifestations: Secondary | ICD-10-CM

## 2016-09-22 DIAGNOSIS — J189 Pneumonia, unspecified organism: Secondary | ICD-10-CM

## 2016-09-22 DIAGNOSIS — A0471 Enterocolitis due to Clostridium difficile, recurrent: Secondary | ICD-10-CM

## 2016-09-22 NOTE — Telephone Encounter (Signed)
Rena received a message that the pt needed to be seen for hospital follow-up. It looks like [facility] is a [facility], correct?

## 2016-09-22 NOTE — Telephone Encounter (Signed)
Yes---Melissa Cooper saw [facility] today at [facility]

## 2016-09-22 NOTE — Telephone Encounter (Signed)
Pt was discharged from [facility] on [facility]16 to f/u with Dr Alphonsus SiasLetvak in 1 week. [facility] Not TCM eligible.

## 2016-09-23 LAB — CULTURE, BLOOD (ROUTINE X 2)
CULTURE: NO GROWTH
CULTURE: NO GROWTH

## 2016-11-08 DIAGNOSIS — L03111 Cellulitis of right axilla: Secondary | ICD-10-CM

## 2016-11-08 DIAGNOSIS — J069 Acute upper respiratory infection, unspecified: Secondary | ICD-10-CM

## 2016-11-13 ENCOUNTER — Encounter: Payer: Self-pay | Admitting: Emergency Medicine

## 2016-11-13 ENCOUNTER — Telehealth: Payer: Self-pay | Admitting: Family Medicine

## 2016-11-13 ENCOUNTER — Emergency Department
Admission: EM | Admit: 2016-11-13 | Discharge: 2016-11-13 | Disposition: A | Discharge status: Home / Self Care | Payer: Medicare Other | Attending: Emergency Medicine | Admitting: Emergency Medicine

## 2016-11-13 ENCOUNTER — Emergency Department: Payer: Medicare Other

## 2016-11-13 DIAGNOSIS — N183 Chronic kidney disease, stage 3 (moderate): Secondary | ICD-10-CM | POA: Diagnosis not present

## 2016-11-13 DIAGNOSIS — Z79899 Other long term (current) drug therapy: Secondary | ICD-10-CM | POA: Insufficient documentation

## 2016-11-13 DIAGNOSIS — J069 Acute upper respiratory infection, unspecified: Secondary | ICD-10-CM | POA: Diagnosis not present

## 2016-11-13 DIAGNOSIS — I129 Hypertensive chronic kidney disease with stage 1 through stage 4 chronic kidney disease, or unspecified chronic kidney disease: Secondary | ICD-10-CM | POA: Diagnosis not present

## 2016-11-13 DIAGNOSIS — R05 Cough: Secondary | ICD-10-CM | POA: Diagnosis present

## 2016-11-13 LAB — COMPREHENSIVE METABOLIC PANEL
ALT: 6 U/L — AB (ref 14–54)
AST: 19 U/L (ref 15–41)
Albumin: 3.9 g/dL (ref 3.5–5.0)
Alkaline Phosphatase: 79 U/L (ref 38–126)
Anion gap: 7 (ref 5–15)
BILIRUBIN TOTAL: 0.7 mg/dL (ref 0.3–1.2)
BUN: 24 mg/dL — ABNORMAL HIGH (ref 6–20)
CHLORIDE: 103 mmol/L (ref 101–111)
CO2: 26 mmol/L (ref 22–32)
CREATININE: 1.22 mg/dL — AB (ref 0.44–1.00)
Calcium: 9.2 mg/dL (ref 8.9–10.3)
GFR, EST AFRICAN AMERICAN: 42 mL/min — AB (ref >60)
GFR, EST NON AFRICAN AMERICAN: 36 mL/min — AB (ref >60)
Glucose, Bld: 90 mg/dL (ref 65–99)
POTASSIUM: 3.7 mmol/L (ref 3.5–5.1)
Sodium: 136 mmol/L (ref 135–145)
TOTAL PROTEIN: 7 g/dL (ref 6.5–8.1)

## 2016-11-13 LAB — CBC WITH DIFFERENTIAL/PLATELET
Basophils Absolute: 0 10*3/uL (ref 0–0.1)
Basophils Relative: 0 %
EOS PCT: 1 %
Eosinophils Absolute: 0.2 10*3/uL (ref 0–0.7)
HEMATOCRIT: 34.9 % — AB (ref 35.0–47.0)
Hemoglobin: 12.2 g/dL (ref 12.0–16.0)
LYMPHS ABS: 1.8 10*3/uL (ref 1.0–3.6)
LYMPHS PCT: 17 %
MCH: 30.3 pg (ref 26.0–34.0)
MCHC: 35 g/dL (ref 32.0–36.0)
MCV: 86.5 fL (ref 80.0–100.0)
MONO ABS: 0.8 10*3/uL (ref 0.2–0.9)
Monocytes Relative: 8 %
NEUTROS ABS: 8.1 10*3/uL — AB (ref 1.4–6.5)
Neutrophils Relative %: 74 %
PLATELETS: 207 10*3/uL (ref 150–440)
RBC: 4.03 MIL/uL (ref 3.80–5.20)
RDW: 14.1 % (ref 11.5–14.5)
WBC: 10.9 10*3/uL (ref 3.6–11.0)

## 2016-11-13 MED ORDER — FLUTICASONE PROPIONATE 50 MCG/ACT NA SUSP: 1.0000 | Freq: Every day | NASAL | 0 refills | Status: DC

## 2016-11-13 NOTE — ED Provider Notes (Signed)
[facility] Emergency Department Provider Note  ____________________________________________   First MD Initiated Contact with Patient 11/13/16 1327     (approximate)  I have reviewed the triage vital signs and the nursing notes.   HISTORY  Chief Complaint Cough and Abnormal Lab   HPI Melissa Cooper is a 83 y.o. female with a recent diagnosis of bronchitis was presented to the emergency department today with a cough as well as decreased renal function. The patient is denying any pain. Was not sent in with lab results. Patient says that she feels well.   Past Medical History:  Diagnosis Date  . CKD (chronic kidney disease)    stage 3  . Dementia   . HTN (hypertension)   . Hypercholesteremia   . OSA (obstructive sleep apnea)   . Osteoarthritis   . Osteoarthritis   . PA (pernicious anemia)     Patient Active Problem List   Diagnosis Date Noted  . Influenza A 09/18/2016  . Acute respiratory insufficiency 09/18/2016  . Abnormal CXR 09/18/2016  . AKI (acute kidney injury) (HCC) 09/18/2016  . Dehydration 09/18/2016  . Hip fracture (HCC) 06/12/2016  . Protein-calorie malnutrition, severe 09/30/2015  . Recurrent colitis due to Clostridium difficile 09/28/2015  . Clostridium difficile colitis 09/05/2015  . Pressure ulcer 08/30/2015  . Sepsis (HCC) 08/29/2015    Past Surgical History:  Procedure Laterality Date  . INTRAMEDULLARY (IM) NAIL INTERTROCHANTERIC Left 06/12/2016   Procedure: INTRAMEDULLARY (IM) NAIL INTERTROCHANTRIC;  Surgeon: Christena Flake, MD;  Location: [facility] ORS;  Service: Orthopedics;  Laterality: Left;  . none      Prior to Admission medications   Medication Sig Start Date End Date Taking? Authorizing Provider  acetaminophen (TYLENOL ARTHRITIS PAIN) 650 MG CR tablet Take 650 mg by mouth 3 (three) times daily.    Historical Provider, MD  acidophilus (RISAQUAD) CAPS capsule Take 1 capsule by mouth 2 (two) times daily. Patient  taking differently: Take 1 capsule by mouth daily.  09/05/15   Gale Journey, MD  benzonatate (TESSALON) 100 MG capsule Take 1 capsule (100 mg total) by mouth 3 (three) times daily as needed for cough. 10/06/15   Enid Baas, MD  docusate sodium (COLACE) 100 MG capsule Take 1 capsule (100 mg total) by mouth 2 (two) times daily. 06/15/16   Enedina Finner, MD  ferrous sulfate 325 (65 FE) MG tablet Take 325 mg by mouth daily.    Historical Provider, MD  metoprolol tartrate (LOPRESSOR) 25 MG tablet Take 1 tablet (25 mg total) by mouth 3 (three) times daily. HOLD IF HR<60 or SBP<100 10/06/15   Enid Baas, MD  Nutritional Supplements (ENSURE CLEAR) LIQD Take 1 Package by mouth 2 (two) times daily. Between meals    Historical Provider, MD  omeprazole (PRILOSEC) 20 MG capsule Take 20 mg by mouth every other day.     Historical Provider, MD  polyethylene glycol (MIRALAX / GLYCOLAX) packet Take 17 g by mouth daily as needed for mild constipation. 06/15/16   Enedina Finner, MD  vitamin B-12 1000 MCG tablet Take 1 tablet (1,000 mcg total) by mouth daily. 09/05/15   Gale Journey, MD    Allergies Actonel [risedronate sodium]; Ceftin [cefuroxime axetil]; Evista [raloxifene]; Miacalcin [calcitonin (salmon)]; and Zyrtec [cetirizine]  History reviewed. No pertinent family history.  Social History Social History  Substance Use Topics  . Smoking status: Never Smoker  . Smokeless tobacco: Never Used  . Alcohol use No    Review of Systems  Constitutional: No fever/chills Eyes: No visual changes. ENT: No sore throat. Cardiovascular: Denies chest pain. Respiratory: Denies shortness of breath. Gastrointestinal: No abdominal pain.  No nausea, no vomiting.  No diarrhea.  No constipation. Genitourinary: Negative for dysuria. Musculoskeletal: Negative for back pain. Skin: Negative for rash. Neurological: Negative for headaches, focal weakness or numbness.  10-point ROS otherwise  negative.  ____________________________________________   PHYSICAL EXAM:  VITAL SIGNS: ED Triage Vitals  Enc Vitals Group     BP 11/13/16 1317 (!) 170/77     Pulse Rate 11/13/16 1317 89     Resp 11/13/16 1317 20     Temp 11/13/16 1317 98.2 F (36.8 C)     Temp Source 11/13/16 1317 Oral     SpO2 11/13/16 1313 97 %     Weight 11/13/16 1318 125 lb (56.7 kg)     Height 11/13/16 1318 5' (1.524 m)     Head Circumference --      Peak Flow --      Pain Score --      Pain Loc --      Pain Edu? --      Excl. in GC? --     Constitutional: Alert and oriented. Well appearing and in no acute distress. Eyes: Conjunctivae are normal. PERRL. EOMI. Head: Atraumatic. Nose: No congestion/rhinnorhea. Mouth/Throat: Mucous membranes are moist.   Neck: No stridor.   Cardiovascular: Normal rate, regular rhythm. Grossly normal heart sounds.  Respiratory: Normal respiratory effort.  No retractions. Lungs CTAB. Gastrointestinal: Soft and nontender. No distention.  Musculoskeletal: No lower extremity tenderness nor edema.  No joint effusions. Neurologic:  Normal speech and language. No gross focal neurologic deficits are appreciated.  Skin:  Skin is warm, dry and intact. No rash noted. Psychiatric: Mood and affect are normal. Speech and behavior are normal.  ____________________________________________   LABS (all labs ordered are listed, but only abnormal results are displayed)  Labs Reviewed  CBC WITH DIFFERENTIAL/PLATELET - Abnormal; Notable for the following:       Result Value   HCT 34.9 (*)    Neutro Abs 8.1 (*)    All other components within normal limits  COMPREHENSIVE METABOLIC PANEL - Abnormal; Notable for the following:    BUN 24 (*)    Creatinine, Ser 1.22 (*)    ALT 6 (*)    GFR calc non Af Amer 36 (*)    GFR calc Af Amer 42 (*)    All other components within normal limits    ____________________________________________  EKG   ____________________________________________  RADIOLOGY  DG Chest 2 View (Accession [id number]) (Order [id number])  Imaging  Date: 11/13/2016 Department: [facility] EMERGENCY DEPARTMENT Released By/Authorizing: Myrna Blazer, MD (auto-released)  Exam Information   Status Exam Begun  Exam Ended   Final [99] 11/13/2016 1:38 PM 11/13/2016 1:45 PM  PACS Images   Show images for DG Chest 2 View  Study Result   CLINICAL DATA:  Cough for a few days.  EXAM: CHEST  2 VIEW  COMPARISON:  09/18/2016 and 06/12/2016  FINDINGS: Lungs are adequately inflated without consolidation or effusion. Cardiomediastinal silhouette is within normal. There is calcified plaque over the aortic arch. Stable density over the upper right paramediastinal region. Remainder the exam is unchanged.  IMPRESSION: No active cardiopulmonary disease.   Electronically Signed   By: Elberta Fortis M.D.   On: 11/13/2016 13:54     ____________________________________________   PROCEDURES  Procedure(s) performed:   Procedures  Critical Care  performed:   ____________________________________________   INITIAL IMPRESSION / ASSESSMENT AND PLAN / ED COURSE  Pertinent labs & imaging results that were available during my care of the patient were reviewed by me and considered in my medical decision making (see chart for details).  ----------------------------------------- 2:47 PM on 11/13/2016 -----------------------------------------  Very reassuring workup. Baseline kidney function. Also with normal blood cell count and reassuring chest x-ray. Patient likely with postnasal drip. We'll discharge with fluticasone spray. Updated the patient as well as her sons are at the bedside about the patient's diagnosis and disposition. The patient has no complaints at this time. Patient as well as family are understanding of  the plan one to comply.      ____________________________________________   FINAL CLINICAL IMPRESSION(S) / ED DIAGNOSES  URI.    NEW MEDICATIONS STARTED DURING THIS VISIT:  New Prescriptions   No medications on file     Note:  This document was prepared using Dragon voice recognition software and may include unintentional dictation errors.    Myrna Blazeravid Matthew Jaydin Boniface, MD 11/13/16 [phone]

## 2016-11-13 NOTE — Telephone Encounter (Signed)
Seen in ER with benign findings--going [facility]

## 2016-11-13 NOTE — ED Notes (Signed)
Pt transported to [facility], pt's DNR given to EMS personell. NAD noted at time of D/C.

## 2016-11-13 NOTE — ED Notes (Signed)
Pt visualized in NAD at this time. Pt resting in bed with family at bedside, respirations even and unlabored, skin, warm, dry, and intact at this time. Pt states "I'm hungry",  informed patient would discuss with MD regarding her being able to eat.

## 2016-11-13 NOTE — ED Notes (Signed)
Pt taken to XR ay at this time.

## 2016-11-13 NOTE — ED Triage Notes (Signed)
[facility] with [facility]/o cough x "a few days". Per EMS pt was dx with bronchitis yesterday, given 1 dose of benzonate without relief. Per EMS the doctor also wants patient to be evaluated for decreasing kidney function and decreasing appetite.

## 2016-11-13 NOTE — ED Notes (Signed)
Pt's family requesting patient to go back [facility] via [facility] due to patient not being able to walk.

## 2016-11-13 NOTE — Telephone Encounter (Signed)
I have received a call (3rd) [facility] [place] [facility], stating the patient has returned from the ED and she does not understand why the patient only received flonase for her condition. Per hospital note, pt was diagnosed with URI by a physician that was able to personally evaluate her and read the cxr.  Dr. Jyl HeinzLetvek has also placed a note in the chart at 4 pm, which is her primary care physician, so he is aware of the situation which outlined pts condition and symptoms.  Judeth CornfieldStephanie (nurse), felt the patient needed something more done, because the patient is not eating/drinking well.  - I understand her concerns and I attempted to reassure her, that I would need to trust in the assessment/treatment plan of the doctor that was able to evaluate her in person and the doctor that knows the patient. Her CXR did not result with acute abnormality.  Her labs are stable.  - If Judeth CornfieldStephanie feels the patient condition changes/worsens then she can call back and we can consider options at that time.

## 2016-11-13 NOTE — Telephone Encounter (Signed)
Received an after hours call for patient Melissa Cooper 83 y.o. female [facility], [place]. Nurse states the patient is coughing, not acting herself, more tired, not eating or drinking much. In January she had influenza A. Nurse states she did recover from that illness and returned to baseline. New symptoms started about 1 week ago.  - Reported VSS.  - CXR obtained and resulted with bronchitis like changes per nurse report and clinical correlation suggested. Nurse feels she does have some lung sound changes.  - Given patient's PMH of CKD 4, recent influenza A and current changes/symptoms and xray changes, I have recommended she be seen in the ED for labs and further eval.

## 2016-11-13 NOTE — ED Notes (Signed)
Pt given food tray at this time

## 2016-11-13 NOTE — ED Notes (Signed)
Pt's D/C instructions reviewed with patient's sons and given to Ines Bloomerarl Segal, pt's son. Explained delay to patient and family, awaiting EMS arrival for transport. Pt and family state understanding at this time.

## 2016-11-14 NOTE — Telephone Encounter (Signed)
Noted. This is not new [facility]---- a qualified but sometimes overzealous nurse

## 2016-11-15 NOTE — Telephone Encounter (Signed)
Dr Letvak has already received note from [facility].

## 2016-11-15 NOTE — Telephone Encounter (Signed)
PLEASE NOTE: All timestamps contained within this report are represented as [place] Standard Time. CONFIDENTIALTY NOTICE: This fax transmission is intended only for the addressee. It contains information that is legally privileged, confidential or otherwise protected from use or disclosure. If you are not the intended recipient, you are strictly prohibited from reviewing, disclosing, copying using or disseminating any of this information or taking any action in reliance on or regarding this information. If you have received this fax in error, please notify us immediately by telephone so that we can arrange for its return to us. Phone: [phone], Toll-Free: [phone], Fax: [phone] Page: 1 of 1 Call Id: [id number] [facility] - Client Nonclinical Telephone Record [facility] Client [facility] - [facility] - Night Physician Tillman AbideLetvak, Richard - MD Contact Type Call Who Is Calling Physician / Provider / Hospital Call Type [facility] Reason for Call Request to speak to Physician Initial Comment Caller states patient is coughing, requests to speak with Doctor On Call. Additional Comment Patient Name Melissa Cooper Patient DOB [birth year] Requesting Provider Sherol DadeStephanie Kennedy, RN Physician Number [phone] [facility] Paging [facility] Phone DateTime Result/Outcome Message Type Notes Felix PaciniKuneff, Renee [phone] 11/13/2016 9:44:14 AM Called On Call Provider - Reached Doctor Paged Felix PaciniKuneff, Renee 11/13/2016 9:44:22 AM Spoke with On Call - General Message Result Call Closed By: Rudi RummageAmy Green Transaction Date/Time: 11/13/2016 9:38:09 AM (ET)

## 2017-01-07 DIAGNOSIS — F0150 Vascular dementia without behavioral disturbance: Secondary | ICD-10-CM | POA: Diagnosis not present

## 2017-01-07 DIAGNOSIS — N183 Chronic kidney disease, stage 3 (moderate): Secondary | ICD-10-CM | POA: Diagnosis not present

## 2017-01-07 DIAGNOSIS — M1990 Unspecified osteoarthritis, unspecified site: Secondary | ICD-10-CM

## 2017-01-07 DIAGNOSIS — D631 Anemia in chronic kidney disease: Secondary | ICD-10-CM | POA: Diagnosis not present

## 2017-01-07 DIAGNOSIS — K219 Gastro-esophageal reflux disease without esophagitis: Secondary | ICD-10-CM | POA: Diagnosis not present

## 2017-01-07 DIAGNOSIS — I10 Essential (primary) hypertension: Secondary | ICD-10-CM | POA: Diagnosis not present

## 2017-02-14 DIAGNOSIS — R05 Cough: Secondary | ICD-10-CM

## 2017-03-08 DIAGNOSIS — F0150 Vascular dementia without behavioral disturbance: Secondary | ICD-10-CM | POA: Diagnosis not present

## 2017-03-08 DIAGNOSIS — M159 Polyosteoarthritis, unspecified: Secondary | ICD-10-CM

## 2017-03-08 DIAGNOSIS — K219 Gastro-esophageal reflux disease without esophagitis: Secondary | ICD-10-CM | POA: Diagnosis not present

## 2017-03-08 DIAGNOSIS — I10 Essential (primary) hypertension: Secondary | ICD-10-CM

## 2017-05-11 DIAGNOSIS — K219 Gastro-esophageal reflux disease without esophagitis: Secondary | ICD-10-CM | POA: Diagnosis not present

## 2017-05-11 DIAGNOSIS — I10 Essential (primary) hypertension: Secondary | ICD-10-CM | POA: Diagnosis not present

## 2017-05-11 DIAGNOSIS — M1990 Unspecified osteoarthritis, unspecified site: Secondary | ICD-10-CM

## 2017-05-11 DIAGNOSIS — F0150 Vascular dementia without behavioral disturbance: Secondary | ICD-10-CM

## 2017-07-11 DIAGNOSIS — M159 Polyosteoarthritis, unspecified: Secondary | ICD-10-CM

## 2017-07-11 DIAGNOSIS — F0150 Vascular dementia without behavioral disturbance: Secondary | ICD-10-CM | POA: Diagnosis not present

## 2017-07-11 DIAGNOSIS — N183 Chronic kidney disease, stage 3 (moderate): Secondary | ICD-10-CM

## 2017-07-11 DIAGNOSIS — I10 Essential (primary) hypertension: Secondary | ICD-10-CM | POA: Diagnosis not present

## 2017-09-21 DIAGNOSIS — F0150 Vascular dementia without behavioral disturbance: Secondary | ICD-10-CM | POA: Diagnosis not present

## 2017-09-21 DIAGNOSIS — N183 Chronic kidney disease, stage 3 (moderate): Secondary | ICD-10-CM | POA: Diagnosis not present

## 2017-09-21 DIAGNOSIS — I10 Essential (primary) hypertension: Secondary | ICD-10-CM

## 2017-09-21 DIAGNOSIS — M1990 Unspecified osteoarthritis, unspecified site: Secondary | ICD-10-CM

## 2017-10-03 ENCOUNTER — Encounter: Payer: Self-pay | Admitting: Podiatry

## 2017-10-03 ENCOUNTER — Ambulatory Visit (INDEPENDENT_AMBULATORY_CARE_PROVIDER_SITE_OTHER): Payer: Medicare Other | Admitting: Podiatry

## 2017-10-03 DIAGNOSIS — M79675 Pain in left toe(s): Secondary | ICD-10-CM

## 2017-10-03 DIAGNOSIS — M2012 Hallux valgus (acquired), left foot: Secondary | ICD-10-CM | POA: Diagnosis not present

## 2017-10-03 DIAGNOSIS — M79674 Pain in right toe(s): Secondary | ICD-10-CM | POA: Diagnosis not present

## 2017-10-03 DIAGNOSIS — B351 Tinea unguium: Secondary | ICD-10-CM | POA: Diagnosis not present

## 2017-10-03 DIAGNOSIS — L97521 Non-pressure chronic ulcer of other part of left foot limited to breakdown of skin: Secondary | ICD-10-CM | POA: Diagnosis not present

## 2017-10-03 NOTE — Progress Notes (Signed)
   Subjective:    Patient ID: Melissa Cooper, female    DOB: 12/22/1917, 84 y.o.   MRN: [id number]  HPI this patient presents the office with chief complaint of a painful second toe left foot.  She says that the toe became painful when she was stepped on weeks earlier.  She says this has become increasingly painful.  She presents the office today for an evaluation of this painful second toe.  She has provided no self treatment nor sought any professional help.  This patient is [age] and it was very difficult to work on her toe as she continually cried out for me not to continue my treatment.  She is to [facility] by [facility] for [facility] painful [facility].  The second toenail on the left foot is extremely thick and long and painful.  She presents the office today for an evaluation and treatment of this second toe. Patient presents to the office in a wheelchair with presumably her son.    Review of Systems  All other systems reviewed and are negative.      Objective:   Physical Exam General Appearance  Alert, conversant and in no acute stress.  Vascular  Dorsalis pedis and posterior pulses are weakly  palpable  bilaterally.  Capillary return is within normal limits  bilaterally. Cold feet  B/L  Neurologic  Senn-Weinstein monofilament wire test within normal limits  bilaterally. Muscle power within normal limits bilaterally.  Nails Thick disfigured discolored nails with subungual debris second toe left foot. No evidence of bacterial infection or drainage bilaterally.  Orthopedic  No limitations of motion of motion feet bilaterally.  No crepitus or effusions noted.  Severe HAV deformity left foot.  Skin  normotropic skin with no porokeratosis noted bilaterally.  Examination of the medial aspect of the second digit at the level of the proximal nail fold reveals ulcerated area in the absence of any redness, swelling or infection.        Assessment & Plan:  Ulcer second toe  left foot.  Onychomycotic nail second toe left foot.   IE  Debridement of nails left foot.  Ulcer second toe left foot.  Bandaged with neosporin/DSD .  Home instructions were sent home with this patient.  She was instructed to soak her toe in soapy soaks in water and bandaged the ulcer site.  The ulcer site was aggravated by a severe HAV deformity first MPJ left foot.  This bandage will help separate the first and the second digits.  Patient to call the office if the problem worsens.   Helane GuntherGregory Mehmet Scally DPM

## 2017-11-15 DIAGNOSIS — I10 Essential (primary) hypertension: Secondary | ICD-10-CM | POA: Diagnosis not present

## 2017-11-15 DIAGNOSIS — M159 Polyosteoarthritis, unspecified: Secondary | ICD-10-CM | POA: Diagnosis not present

## 2017-11-15 DIAGNOSIS — F0150 Vascular dementia without behavioral disturbance: Secondary | ICD-10-CM

## 2017-11-15 DIAGNOSIS — N183 Chronic kidney disease, stage 3 (moderate): Secondary | ICD-10-CM | POA: Diagnosis not present

## 2017-12-02 DIAGNOSIS — L97509 Non-pressure chronic ulcer of other part of unspecified foot with unspecified severity: Secondary | ICD-10-CM

## 2017-12-27 DIAGNOSIS — L97529 Non-pressure chronic ulcer of other part of left foot with unspecified severity: Secondary | ICD-10-CM | POA: Diagnosis not present

## 2018-01-11 DIAGNOSIS — M1990 Unspecified osteoarthritis, unspecified site: Secondary | ICD-10-CM

## 2018-01-11 DIAGNOSIS — F0150 Vascular dementia without behavioral disturbance: Secondary | ICD-10-CM | POA: Diagnosis not present

## 2018-01-11 DIAGNOSIS — I10 Essential (primary) hypertension: Secondary | ICD-10-CM

## 2018-01-11 DIAGNOSIS — N183 Chronic kidney disease, stage 3 (moderate): Secondary | ICD-10-CM

## 2018-03-07 DIAGNOSIS — M159 Polyosteoarthritis, unspecified: Secondary | ICD-10-CM

## 2018-03-07 DIAGNOSIS — I10 Essential (primary) hypertension: Secondary | ICD-10-CM

## 2018-03-07 DIAGNOSIS — N183 Chronic kidney disease, stage 3 (moderate): Secondary | ICD-10-CM

## 2018-03-07 DIAGNOSIS — I739 Peripheral vascular disease, unspecified: Secondary | ICD-10-CM | POA: Diagnosis not present

## 2018-03-07 DIAGNOSIS — F0150 Vascular dementia without behavioral disturbance: Secondary | ICD-10-CM | POA: Diagnosis not present

## 2018-03-21 ENCOUNTER — Ambulatory Visit (INDEPENDENT_AMBULATORY_CARE_PROVIDER_SITE_OTHER): Payer: Medicare Other | Admitting: Vascular Surgery

## 2018-03-21 ENCOUNTER — Encounter (INDEPENDENT_AMBULATORY_CARE_PROVIDER_SITE_OTHER): Payer: Self-pay

## 2018-03-21 ENCOUNTER — Encounter (INDEPENDENT_AMBULATORY_CARE_PROVIDER_SITE_OTHER): Payer: Self-pay | Admitting: Vascular Surgery

## 2018-03-21 VITALS — BP 119/70 | HR 68 | Resp 16 | Ht 62.0 in | Wt 126.0 lb

## 2018-03-21 DIAGNOSIS — I10 Essential (primary) hypertension: Secondary | ICD-10-CM | POA: Diagnosis not present

## 2018-03-21 DIAGNOSIS — N183 Chronic kidney disease, stage 3 unspecified: Secondary | ICD-10-CM

## 2018-03-21 DIAGNOSIS — A0472 Enterocolitis due to Clostridium difficile, not specified as recurrent: Secondary | ICD-10-CM

## 2018-03-21 DIAGNOSIS — F0390 Unspecified dementia without behavioral disturbance: Secondary | ICD-10-CM

## 2018-03-21 DIAGNOSIS — N189 Chronic kidney disease, unspecified: Secondary | ICD-10-CM | POA: Insufficient documentation

## 2018-03-21 DIAGNOSIS — I7025 Atherosclerosis of native arteries of other extremities with ulceration: Secondary | ICD-10-CM | POA: Diagnosis not present

## 2018-03-21 NOTE — Assessment & Plan Note (Signed)
Noninvasive studies were performed at her facility showing biphasic waveforms bilaterally with reduced ABIs worse on the left than the right.  The right ABI is 0.86.  The left ABI 0.75.  Given her age and expected calcification, it is likely that these numbers are falsely elevated. This is a very difficult and limb threatening situation.  Given her age and other comorbidities, she is a very poor surgical candidate.  Angiography can be considered but she would still be a reasonably high risk candidate for that.  Her son would like to have something done as she seems very uncomfortable with this and they have had months to years of a nonhealing ulceration without it.  It is clear this will not heal without revascularization.  I have had a lengthy discussion regarding the risks and benefits the procedure.  I discussed that revascularization does not guarantee wound healing, but it does at least give it a chance.  I have discussed that at this level, toe amputation may or may not heal and she would be a very poor candidate for a major amputation.  I discussed this could be a potentially life-threatening situation if an invasive infection develops in her lower leg.  They voiced their understanding and desire to proceed with angiography of the left lower extremity.

## 2018-03-21 NOTE — Progress Notes (Signed)
Patient ID: Melissa Cooper, female   DOB: 1918/05/15, 84 y.o.   MRN: [id number]  Chief Complaint  Patient presents with  . New Patient (Initial Visit)    ref [name] for abnormal abi    HPI Melissa Cooper is a 84 y.o. female.  I am asked to see the patient by Dr. [name] for evaluation of non-healing ulceration of the left foot with PAD.  The patient not provide any history and this is obtained from the previous medical record as well as her family.  She has had a nonhealing ulceration for at least 6 months.  It sounds like it may have been going on for years although it is difficult to discern.  That foot is tender to touch and has bothered her for some time.  She is no longer ambulatory.  She has not had fever or chills.  There is an open ulceration on the lateral aspect of the first toe and an abrasion on the second toe on the left foot.  No right foot or leg symptoms.  No previous history of surgery or intervention to her lower extremities. Noninvasive studies were performed at her facility showing biphasic waveforms bilaterally with reduced ABIs worse on the left than the right.  The right ABI is 0.86.  The left ABI 0.75.  Given her age and expected calcification, it is likely that these numbers are falsely elevated.   Past Medical History:  Diagnosis Date  . CKD (chronic kidney disease)    stage 3  . Dementia   . HTN (hypertension)   . Hypercholesteremia   . OSA (obstructive sleep apnea)   . Osteoarthritis   . Osteoarthritis   . PA (pernicious anemia)     Past Surgical History:  Procedure Laterality Date  . INTRAMEDULLARY (IM) NAIL INTERTROCHANTERIC Left 06/12/2016   Procedure: INTRAMEDULLARY (IM) NAIL INTERTROCHANTRIC;  Surgeon: Christena Flake, MD;  Location: [facility] ORS;  Service: Orthopedics;  Laterality: Left;  . none      Family History No bleeding disorders, clotting disorders, autoimmune diseases, or aneurysms  Social History Social History   Tobacco Use  . Smoking  status: Never Smoker  . Smokeless tobacco: Never Used  Substance Use Topics  . Alcohol use: No  . Drug use: No  [facility]  Allergies  Allergen Reactions  . Actonel [Risedronate Sodium] Other (See Comments)    Unknown reaction  . Ceftin [Cefuroxime Axetil] Other (See Comments)    Unknown reaction  . Evista [Raloxifene] Other (See Comments)    Unknown reaction  . Miacalcin [Calcitonin (Salmon)] Other (See Comments)    Unknown reaction  . Zyrtec [Cetirizine] Other (See Comments)    Unknown reaction    Current Outpatient Medications  Medication Sig Dispense Refill  . acetaminophen (ACETAMINOPHEN 8 HOUR) 650 MG CR tablet Take 650 mg by mouth every 8 (eight) hours as needed for pain.    Marland Kitchen acidophilus (RISAQUAD) CAPS capsule Take 1 capsule by mouth 2 (two) times daily. (Patient taking differently: Take 1 capsule by mouth daily. ) 60 capsule 3  . aspirin EC 81 MG tablet Take 81 mg by mouth daily.    . benzonatate (TESSALON) 100 MG capsule Take 1 capsule (100 mg total) by mouth 3 (three) times daily as needed for cough. 20 capsule 0  . docusate sodium (COLACE) 100 MG capsule Take 1 capsule (100 mg total) by mouth 2 (two) times daily. 10 capsule 0  . ferrous sulfate 325 (65  FE) MG tablet Take 325 mg by mouth daily.    . Melatonin 3 MG CAPS Take 1 capsule by mouth daily.    . metoprolol tartrate (LOPRESSOR) 25 MG tablet Take 1 tablet (25 mg total) by mouth 3 (three) times daily. HOLD IF HR<60 or SBP<100 90 tablet 2  . Nutritional Supplements (ENSURE CLEAR) LIQD Take 1 Package by mouth 2 (two) times daily. Between meals    . nystatin (NYSTATIN) powder Apply topically as needed.    Marland Kitchen omeprazole (PRILOSEC) 20 MG capsule Take 20 mg by mouth every other day.     . polyethylene glycol (MIRALAX / GLYCOLAX) packet Take 17 g by mouth daily as needed for mild constipation. 14 each 0  . vitamin B-12 1000 MCG tablet Take 1 tablet (1,000 mcg total) by mouth daily. 30 tablet 0  . fluticasone  (FLONASE) 50 MCG/ACT nasal spray Place 1 spray into both nostrils daily. 16 g 0   No current facility-administered medications for this visit.       REVIEW OF SYSTEMS (Negative unless checked) Patient not verbal today and unable to obtain ROS for this reason.    Physical Exam BP 119/70 (BP Location: Right Arm)   Pulse 68   Resp 16   Ht 5\' 2"  (1.575 m)   Wt 126 lb (57.2 kg)   LMP  (LMP Unknown)   BMI 23.05 kg/m  Gen:  WD/WN, NAD. Elderly and debilitated appearing Head: [place]/AT, No temporalis wasting. Ear/Nose/Throat: Hearing decreased, nares w/o erythema or drainage, oropharynx w/o Erythema/Exudate Eyes: Conjunctiva clear, sclera non-icteric  Neck: trachea midline.  Pulmonary:  Good air movement, respirations not labored, no use of accessory muscles Cardiac: irregular Vascular:  Vessel Right Left  Radial Palpable Palpable                          PT  1+ palpable  not palpable  DP  1+ palpable  1+ palpable   Gastrointestinal: soft, non-tender/non-distended.  Musculoskeletal: No right leg edema.  Mild left lower leg edema with ulceration as below.  Uses a wheelchair.  Seems to have some rigidity Neurologic: Motor exam difficult to discern as a sensation.  Patient is somnolent but arouses to pain. Psychiatric: Judgment intact, Mood & affect appropriate for pt's clinical situation. Dermatologic: Approximately 2 cm circular ulceration on the lateral aspect of the left great toe.  Superficial abrasion on the left second toe.  Mild surrounding erythema and edema in the forefoot and toes.    Radiology No results found.  Labs No results found for this or any previous visit (from the past 2160 hour(s)).  Assessment/Plan:  Clostridium difficile colitis Now resolved but had a recurrent issue with this previously.  HTN (hypertension) blood pressure control important in reducing the progression of atherosclerotic disease. On appropriate oral medications.   CKD (chronic  kidney disease) Limit contrast and hydrate with procedure  Dementia Seems reasonably profound.  Atherosclerosis of native arteries of the extremities with ulceration (HCC) Noninvasive studies were performed at her facility showing biphasic waveforms bilaterally with reduced ABIs worse on the left than the right.  The right ABI is 0.86.  The left ABI 0.75.  Given her age and expected calcification, it is likely that these numbers are falsely elevated. This is a very difficult and limb threatening situation.  Given her age and other comorbidities, she is a very poor surgical candidate.  Angiography can be considered but she would still be a reasonably  high risk candidate for that.  Her son would like to have something done as she seems very uncomfortable with this and they have had months to years of a nonhealing ulceration without it.  It is clear this will not heal without revascularization.  I have had a lengthy discussion regarding the risks and benefits the procedure.  I discussed that revascularization does not guarantee wound healing, but it does at least give it a chance.  I have discussed that at this level, toe amputation may or may not heal and she would be a very poor candidate for a major amputation.  I discussed this could be a potentially life-threatening situation if an invasive infection develops in her lower leg.  They voiced their understanding and desire to proceed with angiography of the left lower extremity.      Festus BarrenJason Kayleena Eke 03/21/2018, 10:09 AM   This note was created with Dragon medical transcription system.  Any errors from dictation are unintentional.

## 2018-03-21 NOTE — Assessment & Plan Note (Signed)
blood pressure control important in reducing the progression of atherosclerotic disease. On appropriate oral medications.  

## 2018-03-21 NOTE — Patient Instructions (Signed)

## 2018-03-21 NOTE — Assessment & Plan Note (Signed)
Now resolved but had a recurrent issue with this previously.

## 2018-03-21 NOTE — Assessment & Plan Note (Signed)
Seems reasonably profound.

## 2018-03-21 NOTE — Assessment & Plan Note (Signed)
Limit contrast and hydrate with procedure

## 2018-03-22 ENCOUNTER — Encounter (INDEPENDENT_AMBULATORY_CARE_PROVIDER_SITE_OTHER): Payer: Self-pay

## 2018-03-27 ENCOUNTER — Other Ambulatory Visit (INDEPENDENT_AMBULATORY_CARE_PROVIDER_SITE_OTHER): Payer: Self-pay | Admitting: Vascular Surgery

## 2018-03-30 ENCOUNTER — Other Ambulatory Visit: Payer: Self-pay

## 2018-03-30 ENCOUNTER — Emergency Department
Admission: EM | Admit: 2018-03-30 | Discharge: 2018-03-30 | Disposition: A | Discharge status: Home / Self Care | Payer: Medicare Other | Attending: Emergency Medicine | Admitting: Emergency Medicine

## 2018-03-30 ENCOUNTER — Emergency Department: Payer: Medicare Other

## 2018-03-30 DIAGNOSIS — M79672 Pain in left foot: Secondary | ICD-10-CM | POA: Diagnosis present

## 2018-03-30 DIAGNOSIS — Z7982 Long term (current) use of aspirin: Secondary | ICD-10-CM | POA: Insufficient documentation

## 2018-03-30 DIAGNOSIS — L97529 Non-pressure chronic ulcer of other part of left foot with unspecified severity: Secondary | ICD-10-CM

## 2018-03-30 DIAGNOSIS — F0390 Unspecified dementia without behavioral disturbance: Secondary | ICD-10-CM | POA: Insufficient documentation

## 2018-03-30 DIAGNOSIS — Z79899 Other long term (current) drug therapy: Secondary | ICD-10-CM | POA: Insufficient documentation

## 2018-03-30 DIAGNOSIS — N189 Chronic kidney disease, unspecified: Secondary | ICD-10-CM | POA: Insufficient documentation

## 2018-03-30 DIAGNOSIS — I129 Hypertensive chronic kidney disease with stage 1 through stage 4 chronic kidney disease, or unspecified chronic kidney disease: Secondary | ICD-10-CM | POA: Insufficient documentation

## 2018-03-30 LAB — CBC WITH DIFFERENTIAL/PLATELET
BASOS PCT: 0 %
Basophils Absolute: 0 10*3/uL (ref 0–0.1)
EOS ABS: 0.1 10*3/uL (ref 0–0.7)
Eosinophils Relative: 2 %
HCT: 31.4 % — ABNORMAL LOW (ref 35.0–47.0)
HEMOGLOBIN: 10.6 g/dL — AB (ref 12.0–16.0)
Lymphocytes Relative: 11 %
Lymphs Abs: 0.6 10*3/uL — ABNORMAL LOW (ref 1.0–3.6)
MCH: 30.3 pg (ref 26.0–34.0)
MCHC: 33.7 g/dL (ref 32.0–36.0)
MCV: 90 fL (ref 80.0–100.0)
Monocytes Absolute: 0.6 10*3/uL (ref 0.2–0.9)
Monocytes Relative: 10 %
NEUTROS PCT: 77 %
Neutro Abs: 4.3 10*3/uL (ref 1.4–6.5)
Platelets: 186 10*3/uL (ref 150–440)
RBC: 3.49 MIL/uL — AB (ref 3.80–5.20)
RDW: 14.3 % (ref 11.5–14.5)
WBC: 5.7 10*3/uL (ref 3.6–11.0)

## 2018-03-30 LAB — BASIC METABOLIC PANEL
Anion gap: 8 (ref 5–15)
BUN: 24 mg/dL — ABNORMAL HIGH (ref 8–23)
CALCIUM: 8.7 mg/dL — AB (ref 8.9–10.3)
CHLORIDE: 102 mmol/L (ref 98–111)
CO2: 27 mmol/L (ref 22–32)
CREATININE: 1.34 mg/dL — AB (ref 0.44–1.00)
GFR calc Af Amer: 37 mL/min — ABNORMAL LOW (ref >60)
GFR calc non Af Amer: 32 mL/min — ABNORMAL LOW (ref >60)
Glucose, Bld: 98 mg/dL (ref 70–99)
Potassium: 4.8 mmol/L (ref 3.5–5.1)
SODIUM: 137 mmol/L (ref 135–145)

## 2018-03-30 LAB — LACTIC ACID, PLASMA: LACTIC ACID, VENOUS: 1.3 mmol/L (ref 0.5–1.9)

## 2018-03-30 NOTE — ED Provider Notes (Signed)
-----------------------------------------   5:11 PM on 03/30/2018 -----------------------------------------  Signout, patient is awaiting to be seen by her vascular surgeons and if they agree with discharge she is to be discharged.  She was seen by Dr. Gilda CreaseSchnier.  Appreciate the consult.  He feels the patient should follow-up closely on Monday with his partner Dr. Wyn Quakerew, he does not recommend any change in management.  Accordingly we will use the already printed discharge instructions from the prior physician, patient has no further complaints.   Jeanmarie PlantMcShane, Alea Ryer A, MD 03/30/18 [phone]

## 2018-03-30 NOTE — ED Notes (Signed)
Pts family updated on wait for transfer (EMS).

## 2018-03-30 NOTE — Discharge Instructions (Signed)
Ms. Melissa Cooper was evaluated in the emergency department by the vascular surgeon.  She is stable to continue her antibiotic and have her procedure on Monday as scheduled.  She should return to the ER for new or worsening pain, rash, discoloration, fever, weakness or any other new or worsening symptoms.

## 2018-03-30 NOTE — ED Notes (Signed)
Pt provided snack and drink, family bedside, continue waiting for EMS transport.

## 2018-03-30 NOTE — ED Triage Notes (Signed)
Pt arrives from [facility] via [facility]. Pt is being trx for toe infection by her PCP and wound care, sent today by nursing home for further eval of injection.

## 2018-03-30 NOTE — ED Provider Notes (Signed)
[facility] Emergency Department Provider Note ____________________________________________   First MD Initiated Contact with Patient 03/30/18 1215     (approximate)  I have reviewed the triage vital signs and the nursing notes.   HISTORY  Chief Complaint Toe Pain  Level 5 caveat: History of present illness limited due to dementia  HPI Melissa Cooper is a 84 y.o. female with PMH as noted below including a history of peripheral arterial disease and chronic nonhealing ulcer to the left foot.  She presents from her nursing home for somewhat unclear reasons.  Per EMS, the nursing home was concerned that the ulcer in the left great toe may be getting worse.  The note from the nursing home mentions that she has a wound there, but does not specify the exact reason that she was sent to the hospital today.  The patient is actually scheduled for angiography in 4 days.  She is unable to verbalize any complaints.  Past Medical History:  Diagnosis Date  . CKD (chronic kidney disease)    stage 3  . Dementia   . HTN (hypertension)   . Hypercholesteremia   . OSA (obstructive sleep apnea)   . Osteoarthritis   . Osteoarthritis   . PA (pernicious anemia)     Patient Active Problem List   Diagnosis Date Noted  . HTN (hypertension) 03/21/2018  . CKD (chronic kidney disease) 03/21/2018  . Dementia 03/21/2018  . 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 Influenza A 09/18/2016  . Acute respiratory insufficiency 09/18/2016  . Abnormal CXR 09/18/2016  . AKI (acute kidney injury) (HCC) 09/18/2016  . Dehydration 09/18/2016  . Hip fracture (HCC) 06/12/2016  . Protein-calorie malnutrition, severe 09/30/2015  . Recurrent colitis due to Clostridium difficile 09/28/2015  . Clostridium difficile colitis 09/05/2015  . Pressure ulcer 08/30/2015  . Sepsis (HCC) 08/29/2015    Past Surgical History:  Procedure Laterality Date  .  INTRAMEDULLARY (IM) NAIL INTERTROCHANTERIC Left 06/12/2016   Procedure: INTRAMEDULLARY (IM) NAIL INTERTROCHANTRIC;  Surgeon: Christena Flake, MD;  Location: [facility] ORS;  Service: Orthopedics;  Laterality: Left;  . none      Prior to Admission medications   Medication Sig Start Date End Date Taking? Authorizing Provider  acetaminophen (ACETAMINOPHEN 8 HOUR) 650 MG CR tablet Take 650 mg by mouth every 8 (eight) hours as needed for pain.   Yes [provider]  acidophilus (RISAQUAD) CAPS capsule Take 1 capsule by mouth 2 (two) times daily. Patient taking differently: Take 1 capsule by mouth daily.  09/05/15  Yes Gale Journey, MD  aspirin EC 81 MG tablet Take 81 mg by mouth daily.   Yes [provider]  benzonatate (TESSALON) 100 MG capsule Take 1 capsule (100 mg total) by mouth 3 (three) times daily as needed for cough. 10/06/15  Yes Enid Baas, MD  ciprofloxacin (CIPRO) 250 MG tablet Take 250 mg by mouth 2 (two) times daily. 03/24/18 03/31/18 Yes [provider]  Melatonin 3 MG CAPS Take 1 capsule by mouth at bedtime.    Yes [provider]  metoprolol succinate (TOPROL-XL) 50 MG 24 hr tablet Take 50 mg by mouth daily. Take with or immediately following a meal.   Yes [provider]  Nutritional Supplements (ENSURE CLEAR) LIQD Take 1 Package by mouth 2 (two) times daily. Between meals   Yes [provider]  nystatin (NYSTATIN) powder Apply topically 2 (two) times daily as needed (rash (under breasts  and groin area)).    Yes [provider]  polyethylene glycol (MIRALAX / GLYCOLAX) packet Take 17 g by mouth daily as needed for mild constipation. 06/15/16  Yes Enedina Finner, MD  traMADol (ULTRAM) 50 MG tablet Take 50 mg by mouth every 8 (eight) hours as needed (pain).   Yes [provider]  vitamin B-12 1000 MCG tablet Take 1 tablet (1,000 mcg total) by mouth daily. 09/05/15  Yes Gale Journey, MD  docusate sodium (COLACE) 100  MG capsule Take 1 capsule (100 mg total) by mouth 2 (two) times daily. Patient not taking: Reported on 03/30/2018 06/15/16   Enedina Finner, MD  fluticasone [facility]) 50 MCG/ACT nasal spray Place 1 spray into both nostrils daily. 11/13/16 11/13/17  Myrna Blazer, MD  metoprolol tartrate (LOPRESSOR) 25 MG tablet Take 1 tablet (25 mg total) by mouth 3 (three) times daily. HOLD IF HR<60 or SBP<100 Patient not taking: Reported on 03/30/2018 10/06/15   Enid Baas, MD    Allergies Actonel [risedronate sodium]; Ceftin [cefuroxime axetil]; Evista [raloxifene]; Miacalcin [calcitonin (salmon)]; and Zyrtec [cetirizine]  No family history on file.  Social History Social History   Tobacco Use  . Smoking status: Never Smoker  . Smokeless tobacco: Never Used  Substance Use Topics  . Alcohol use: No  . Drug use: No    Review of Systems Level 5 caveat: Unable to obtain review of systems due to dementia    ____________________________________________   PHYSICAL EXAM:  VITAL SIGNS: ED Triage Vitals  Enc Vitals Group     BP 03/30/18 1214 (!) 134/98     Pulse Rate 03/30/18 1214 78     Resp 03/30/18 1214 17     Temp 03/30/18 1202 97.8 F (36.6 C)     Temp src --      SpO2 03/30/18 1214 98 %     Weight 03/30/18 1203 126 lb (57.2 kg)     Height 03/30/18 1203 5\' 2"  (1.575 m)     Head Circumference --      Peak Flow --      Pain Score --      Pain Loc --      Pain Edu? --      Excl. in GC? --     Constitutional: Alert, comfortable appearing. Eyes: Conjunctivae are normal.  Head: Atraumatic. Nose: No congestion/rhinnorhea. Mouth/Throat: Mucous membranes are moist.   Neck: Normal range of motion.  Cardiovascular: Normal rate, regular rhythm.  Respiratory: Normal respiratory effort.  Gastrointestinal: No distention. Musculoskeletal: Left great toe with swelling and erythema, with deep ulceration to the lateral aspect and a dusky color surrounding it.  No erythema or  induration proximally. Neurologic: Motor intact in all extremities.  No gross focal neurologic deficits are appreciated.  Skin:  Skin is warm and dry.  Psychiatric: Calm and cooperative.  ____________________________________________   LABS (all labs ordered are listed, but only abnormal results are displayed)  Labs Reviewed  BASIC METABOLIC PANEL - Abnormal; Notable for the following components:      Result Value   BUN 24 (*)    Creatinine, Ser 1.34 (*)    Calcium 8.7 (*)    GFR calc non Af Amer 32 (*)    GFR calc Af Amer 37 (*)    All other components within normal limits  CBC WITH DIFFERENTIAL/PLATELET - Abnormal; Notable for the following components:   RBC 3.49 (*)    Hemoglobin 10.6 (*)    HCT 31.4 (*)  Lymphs Abs 0.6 (*)    All other components within normal limits  LACTIC ACID, PLASMA  LACTIC ACID, PLASMA   ____________________________________________  EKG   ____________________________________________  RADIOLOGY  XR left foot: No acute findings ____________________________________________   PROCEDURES  Procedure(s) performed: No  Procedures  Critical Care performed: No ____________________________________________   INITIAL IMPRESSION / ASSESSMENT AND PLAN / ED COURSE  Pertinent labs & imaging results that were available during my care of the patient were reviewed by me and considered in my medical decision making (see chart for details).  [age] year old female with history of peripheral arterial disease and chronic left great toe nonhealing wound presents for further evaluation of this wound, although the exact reason for her transfer to the emergency department is somewhat unclear to me from the information provided by the nursing home paperwork or EMS.  I reviewed the past medical records in epic; the patient was seen by Dr. Wyn Quakerew from vascular surgery last week.  Per his note, he felt that the patient was a high surgical risk but the decision was made  to proceed with angiography.  This is pending for 04/03/2018.  On exam, there is an ulcer to the lateral aspect of the great toe, with some dusky color surrounding it, and erythema to the rest of the toe, but no erythema or induration to the foot or any proximal area.  The patient has localized tenderness when I move or touch the toe, but no other acute symptoms.  Based on the information available to me, I suspect that the patient's physical exam may have worsened (although I have no direct way to compare this) my differential includes acute cellulitis, osteomyelitis, gangrene.  We will obtain labs and an x-ray, and then I will consult vascular surgery for further recommendations.  ----------------------------------------- 3:53 PM on 03/30/2018 ------------------------------------  Lab work-up and x-ray show no concerning acute findings.  The patient remained stable.  I contacted the nurse working with Dr. Gilda CreaseSchnier, the vascular surgeon on-call, for a consult.  She informed that Dr. Gilda CreaseSchnier will come to evaluate the patient.  I anticipate that  after this evaluation if determined to have no concerning acute findings, the patient may be discharged back to her facility and have her angiography on Monday as scheduled.    I am signing the patient out to the oncoming physician Dr. Alphonzo LemmingsMcShane.   ____________________________________________   FINAL CLINICAL IMPRESSION(S) / ED DIAGNOSES  Final diagnoses:  Ulcer of toe of left foot, unspecified ulcer stage (HCC)      NEW MEDICATIONS STARTED DURING THIS VISIT:  New Prescriptions   No medications on file     Note:  This document was prepared using Dragon voice recognition software and may include unintentional dictation errors.    Dionne BucySiadecki, Julis Haubner, MD 03/30/18 1555

## 2018-03-30 NOTE — ED Notes (Signed)
Pt requests to be unhooked from monitor as she is waiting for DC.

## 2018-03-31 DIAGNOSIS — I7025 Atherosclerosis of native arteries of other extremities with ulceration: Secondary | ICD-10-CM | POA: Diagnosis not present

## 2018-04-02 MED ORDER — CLINDAMYCIN PHOSPHATE 300 MG/50ML IV SOLN
300.0000 mg | Freq: Once | INTRAVENOUS | Status: AC
  Administered 2018-04-03: 300 mg via INTRAVENOUS

## 2018-04-03 ENCOUNTER — Ambulatory Visit
Admission: RE | Admit: 2018-04-03 | Discharge: 2018-04-03 | Disposition: A | Discharge status: Skilled Nursing Facility | Payer: Medicare Other | Source: Ambulatory Visit | Attending: Vascular Surgery | Admitting: Vascular Surgery

## 2018-04-03 ENCOUNTER — Encounter
Admission: RE | Disposition: A | Discharge status: Skilled Nursing Facility | Payer: Self-pay | Source: Ambulatory Visit | Attending: Vascular Surgery

## 2018-04-03 DIAGNOSIS — I7025 Atherosclerosis of native arteries of other extremities with ulceration: Secondary | ICD-10-CM | POA: Insufficient documentation

## 2018-04-03 DIAGNOSIS — A0472 Enterocolitis due to Clostridium difficile, not specified as recurrent: Secondary | ICD-10-CM | POA: Insufficient documentation

## 2018-04-03 DIAGNOSIS — Z7982 Long term (current) use of aspirin: Secondary | ICD-10-CM | POA: Insufficient documentation

## 2018-04-03 DIAGNOSIS — N183 Chronic kidney disease, stage 3 (moderate): Secondary | ICD-10-CM | POA: Diagnosis not present

## 2018-04-03 DIAGNOSIS — I129 Hypertensive chronic kidney disease with stage 1 through stage 4 chronic kidney disease, or unspecified chronic kidney disease: Secondary | ICD-10-CM | POA: Insufficient documentation

## 2018-04-03 DIAGNOSIS — Z7951 Long term (current) use of inhaled steroids: Secondary | ICD-10-CM | POA: Diagnosis not present

## 2018-04-03 DIAGNOSIS — G4733 Obstructive sleep apnea (adult) (pediatric): Secondary | ICD-10-CM | POA: Insufficient documentation

## 2018-04-03 DIAGNOSIS — I70299 Other atherosclerosis of native arteries of extremities, unspecified extremity: Secondary | ICD-10-CM

## 2018-04-03 DIAGNOSIS — Z888 Allergy status to other drugs, medicaments and biological substances status: Secondary | ICD-10-CM | POA: Insufficient documentation

## 2018-04-03 DIAGNOSIS — Z9889 Other specified postprocedural states: Secondary | ICD-10-CM | POA: Insufficient documentation

## 2018-04-03 DIAGNOSIS — M1990 Unspecified osteoarthritis, unspecified site: Secondary | ICD-10-CM | POA: Diagnosis not present

## 2018-04-03 DIAGNOSIS — Z79899 Other long term (current) drug therapy: Secondary | ICD-10-CM | POA: Insufficient documentation

## 2018-04-03 DIAGNOSIS — I70248 Atherosclerosis of native arteries of left leg with ulceration of other part of lower left leg: Secondary | ICD-10-CM

## 2018-04-03 DIAGNOSIS — F0390 Unspecified dementia without behavioral disturbance: Secondary | ICD-10-CM | POA: Diagnosis not present

## 2018-04-03 DIAGNOSIS — D510 Vitamin B12 deficiency anemia due to intrinsic factor deficiency: Secondary | ICD-10-CM | POA: Diagnosis not present

## 2018-04-03 DIAGNOSIS — L97521 Non-pressure chronic ulcer of other part of left foot limited to breakdown of skin: Secondary | ICD-10-CM | POA: Diagnosis not present

## 2018-04-03 DIAGNOSIS — L97909 Non-pressure chronic ulcer of unspecified part of unspecified lower leg with unspecified severity: Secondary | ICD-10-CM

## 2018-04-03 HISTORY — PX: LOWER EXTREMITY ANGIOGRAPHY: CATH118251

## 2018-04-03 SURGERY — LOWER EXTREMITY ANGIOGRAPHY
Anesthesia: Moderate Sedation | Laterality: Left

## 2018-04-03 MED ORDER — HEPARIN SODIUM (PORCINE) 1000 UNIT/ML IJ SOLN
INTRAMUSCULAR | Status: AC
  Filled 2018-04-03: qty 1

## 2018-04-03 MED ORDER — ATORVASTATIN CALCIUM 10 MG PO TABS: 10.0000 mg | ORAL_TABLET | Freq: Every day | ORAL | 11 refills | Status: DC

## 2018-04-03 MED ORDER — METHYLPREDNISOLONE SODIUM SUCC 125 MG IJ SOLR: 125.0000 mg | INTRAMUSCULAR | Status: DC | PRN

## 2018-04-03 MED ORDER — MIDAZOLAM HCL 2 MG/2ML IJ SOLN
INTRAMUSCULAR | Status: DC | PRN
  Administered 2018-04-03 (×2): 1 mg via INTRAVENOUS

## 2018-04-03 MED ORDER — ONDANSETRON HCL 4 MG/2ML IJ SOLN: 4.0000 mg | Freq: Four times a day (QID) | INTRAMUSCULAR | Status: DC | PRN

## 2018-04-03 MED ORDER — IOPAMIDOL (ISOVUE-300) INJECTION 61%
INTRAVENOUS | Status: DC | PRN
  Administered 2018-04-03: 50 mL via INTRAVENOUS

## 2018-04-03 MED ORDER — FAMOTIDINE 20 MG PO TABS: 40.0000 mg | ORAL_TABLET | ORAL | Status: DC | PRN

## 2018-04-03 MED ORDER — HYDROMORPHONE HCL 1 MG/ML IJ SOLN: 1.0000 mg | Freq: Once | INTRAMUSCULAR | Status: DC | PRN

## 2018-04-03 MED ORDER — CLOPIDOGREL BISULFATE 75 MG PO TABS: 75.0000 mg | ORAL_TABLET | Freq: Every day | ORAL | 11 refills | Status: DC

## 2018-04-03 MED ORDER — LIDOCAINE-EPINEPHRINE (PF) 1 %-1:200000 IJ SOLN
INTRAMUSCULAR | Status: AC
  Filled 2018-04-03: qty 30

## 2018-04-03 MED ORDER — CLINDAMYCIN PHOSPHATE 300 MG/50ML IV SOLN
INTRAVENOUS | Status: AC
  Filled 2018-04-03: qty 50

## 2018-04-03 MED ORDER — MIDAZOLAM HCL 5 MG/5ML IJ SOLN
INTRAMUSCULAR | Status: AC
  Filled 2018-04-03: qty 5

## 2018-04-03 MED ORDER — HEPARIN (PORCINE) IN NACL 1000-0.9 UT/500ML-% IV SOLN
INTRAVENOUS | Status: AC
  Filled 2018-04-03: qty 1000

## 2018-04-03 MED ORDER — SODIUM CHLORIDE 0.9 % IV SOLN: INTRAVENOUS | Status: DC

## 2018-04-03 MED ORDER — FENTANYL CITRATE (PF) 100 MCG/2ML IJ SOLN
INTRAMUSCULAR | Status: AC
  Filled 2018-04-03: qty 2

## 2018-04-03 MED ORDER — FENTANYL CITRATE (PF) 100 MCG/2ML IJ SOLN
INTRAMUSCULAR | Status: DC | PRN
  Administered 2018-04-03 (×2): 25 ug via INTRAVENOUS

## 2018-04-03 MED ORDER — HEPARIN SODIUM (PORCINE) 1000 UNIT/ML IJ SOLN
INTRAMUSCULAR | Status: DC | PRN
  Administered 2018-04-03: 4000 [IU] via INTRAVENOUS

## 2018-04-03 SURGICAL SUPPLY — 18 items
BALLN LUTONIX DCB 5X100X130 (BALLOONS) ×3
BALLN ULTRVRSE 3X150X150 (BALLOONS) ×3
BALLOON LUTONIX DCB 5X100X130 (BALLOONS) ×1 IMPLANT
BALLOON ULTRVRSE 3X150X150 (BALLOONS) ×1 IMPLANT
CATH BEACON 5 .035 65 RIM TIP (CATHETERS) ×3 IMPLANT
CATH BEACON 5 .038 100 VERT TP (CATHETERS) ×3 IMPLANT
CATH PIG 70CM (CATHETERS) ×3 IMPLANT
DEVICE PRESTO INFLATION (MISCELLANEOUS) ×3 IMPLANT
DEVICE SAFEGUARD 24CM (GAUZE/BANDAGES/DRESSINGS) ×3 IMPLANT
DEVICE STARCLOSE SE CLOSURE (Vascular Products) ×3 IMPLANT
GLIDEWIRE ADV .035X260CM (WIRE) ×3 IMPLANT
PACK ANGIOGRAPHY (CUSTOM PROCEDURE TRAY) ×3 IMPLANT
SHEATH BRITE TIP 5FRX11 (SHEATH) ×3 IMPLANT
SHEATH RAABE 6FRX70 (SHEATH) ×3 IMPLANT
SYR MEDRAD MARK V 150ML (SYRINGE) ×3 IMPLANT
TUBING CONTRAST HIGH PRESS 72 (TUBING) ×3 IMPLANT
WIRE G V18X300CM (WIRE) ×3 IMPLANT
WIRE J 3MM .035X145CM (WIRE) ×3 IMPLANT

## 2018-04-03 NOTE — Op Note (Signed)
[facility]  Percutaneous Study/Intervention Procedural Note   Date of Surgery: 04/03/2018  Surgeon(s):Dannya Pitkin    Assistants:none  Pre-operative Diagnosis: PAD with ulceration LLE  Post-operative diagnosis:  Same  Procedure(s) Performed:             1.  Ultrasound guidance for vascular access right femoral artery             2.  Catheter placement into left peroneal artery from right femoral approach             3.  Aortogram and selective left lower extremity angiogram             4.  Percutaneous transluminal angioplasty of left peroneal artery and tibioperoneal trunk with 3 mm diameter by 15 cm length angioplasty balloon             5.  Percutaneous transluminal angioplasty of the left above-knee popliteal artery with 5 mm diameter by 10 cm length Lutonix drug-coated angioplasty balloon  6.  StarClose closure device right femoral artery  EBL: 10 cc  Contrast: 50 cc  Fluoro Time: 3.3 minutes  Moderate Conscious Sedation Time: approximately 30 minutes using 2 mg of Versed and 50 mcg of Fentanyl              Indications:  Patient is a 84 y.o.female with long standing ischemic ulceration of the left foot. The patient is brought in for angiography for further evaluation and potential treatment.  Due to the limb threatening nature of the situation, angiogram was performed for attempted limb salvage. The patient is aware that if the procedure fails, amputation would be expected.  The patient also understands that even with successful revascularization, amputation may still be required due to the severity of the situation.  Risks and benefits are discussed and informed consent is obtained.   Procedure:  The patient was identified and appropriate procedural time out was performed.  The patient was then placed supine on the table and prepped and draped in the usual sterile fashion. Moderate conscious sedation was administered during a face to face encounter with the  patient throughout the procedure with my supervision of the RN administering medicines and monitoring the patient's vital signs, pulse oximetry, telemetry and mental status throughout from the start of the procedure until the patient was taken to the recovery room. Ultrasound was used to evaluate the right common femoral artery.  It was patent .  A digital ultrasound image was acquired.  A Seldinger needle was used to access the right common femoral artery under direct ultrasound guidance and a permanent image was performed.  A 0.035 J wire was advanced without resistance and a 5Fr sheath was placed.  Pigtail catheter was placed into the aorta and an AP aortogram was performed. This demonstrated normal renal arteries and normal aorta and iliac segments without significant stenosis. I then crossed the aortic bifurcation and advanced to the left femoral head. Selective left lower extremity angiogram was then performed. This demonstrated a normal common femoral artery, somewhat pruned profunda femoris artery without focal stenosis, and a reasonably normal SFA with only mild disease.  The popliteal artery had about a 60% stenosis in the above-knee segment that was short segment and about a 50% stenosis in the most proximal popliteal artery.  The tibioperoneal trunk and proximal peroneal artery were then occluded over a short segment with reconstitution through collaterals in the proximal peroneal artery which was the only runoff distally. The patient was  systemically heparinized and a 6 French 70 cm sheath was then placed over the Terumo Advantage wire. I then used a Kumpe catheter and the advantage wire to navigate through the popliteal stenosis and the TP trunk and peroneal occlusion and then confirm intraluminal flow in the peroneal artery and the midsegment.  I then placed a 0.018 wire and proceeded with treatment.  The catheter was removed.  A 3 mm diameter by 15 cm length angioplasty balloon was inflated in the  proximal peroneal artery and tibioperoneal trunk up to 8 atm for 1 minute.  Completion angiogram showed less than 10% residual stenosis with brisk flow distally.  I then turned my attention to the moderate popliteal lesion.  A 5 mm diameter by 10 cm length Lutonix drug-coated angioplasty balloon was inflated to 10 atm for 1 minute and the above-knee popliteal artery.  Completion angiogram showed about a 10 to 15% residual stenosis without dissection. I elected to terminate the procedure. The sheath was removed and StarClose closure device was deployed in the right femoral artery with excellent hemostatic result. The patient was taken to the recovery room in stable condition having tolerated the procedure well.  Findings:               Aortogram:  Normal renal arteries, aorta and iliac arteries without significant stenosis but significantly tortuous in the iliac vessels             Left lower Extremity:  Normal common femoral artery, somewhat pruned profunda femoris artery without focal stenosis, and a reasonably normal SFA with only mild disease.  The popliteal artery had about a 60% stenosis in the above-knee segment that was short segment and about a 50% stenosis in the most proximal popliteal artery.  The tibioperoneal trunk and proximal peroneal artery were then occluded over a short segment with reconstitution through collaterals in the proximal peroneal artery which was the only runoff distally.   Disposition: Patient was taken to the recovery room in stable condition having tolerated the procedure well.  Complications: None  Leotis Pain 04/03/2018 10:17 AM   This note was created with Dragon Medical transcription system. Any errors in dictation are purely unintentional.

## 2018-04-03 NOTE — H&P (Signed)
[facility] History & Physical Update  The patient was interviewed and re-examined.  The patient's previous History and Physical has been reviewed and is unchanged.  There is no change in the plan of care. We plan to proceed with the scheduled procedure.  Festus BarrenJason Nylene Inlow, MD  04/03/2018, 8:14 AM

## 2018-04-03 NOTE — Progress Notes (Signed)
MD in at bedside to speak with pt. 2 sons re: results. Both verbalize understanding. followup appt. Made. Right groin clean, dry, intact without complictions at site: PAD intact at RFA site.

## 2018-04-03 NOTE — Progress Notes (Addendum)
Right groin PAD deflated from 40 ml to 0 ml. PAD removed. Sterile 2x2 and tegaderm to site. No hematoma, edema, ecchymosis, drainage at RFA site. Right forearm site : NS lock DC'd intact: immediate ecchymosis to site. Manual pressure held x 2 min. Pt. With frail, thin skin. No hematoma at site. Pt. Groans while being dressed ; peri and anal area noted to be bright pink without any active open skin areas. Pt. Cleansed after incontinent now ; new diaper placed on. Pt. Max assist to wheelchair. NT assisted pt. Out to front of hospital for transport to [facility]. Pt. 2 sons along side pt. For DC. Pt. Stable for DC. Pt. Sons state "mom sleeps most of the day, every day. Her times are messed up." Pt. Refused to open eyes, yet "mumbled" a few words.

## 2018-04-04 ENCOUNTER — Encounter: Payer: Self-pay | Admitting: Vascular Surgery

## 2018-04-12 ENCOUNTER — Encounter (INDEPENDENT_AMBULATORY_CARE_PROVIDER_SITE_OTHER): Payer: Self-pay

## 2018-05-04 DIAGNOSIS — I739 Peripheral vascular disease, unspecified: Secondary | ICD-10-CM | POA: Diagnosis not present

## 2018-05-04 DIAGNOSIS — L97529 Non-pressure chronic ulcer of other part of left foot with unspecified severity: Secondary | ICD-10-CM | POA: Diagnosis not present

## 2018-05-12 DIAGNOSIS — N183 Chronic kidney disease, stage 3 (moderate): Secondary | ICD-10-CM

## 2018-05-12 DIAGNOSIS — F0150 Vascular dementia without behavioral disturbance: Secondary | ICD-10-CM

## 2018-05-12 DIAGNOSIS — I70245 Atherosclerosis of native arteries of left leg with ulceration of other part of foot: Secondary | ICD-10-CM

## 2018-05-12 DIAGNOSIS — I10 Essential (primary) hypertension: Secondary | ICD-10-CM

## 2018-05-12 DIAGNOSIS — M1990 Unspecified osteoarthritis, unspecified site: Secondary | ICD-10-CM

## 2018-05-12 DIAGNOSIS — I739 Peripheral vascular disease, unspecified: Secondary | ICD-10-CM

## 2018-05-18 DIAGNOSIS — L97529 Non-pressure chronic ulcer of other part of left foot with unspecified severity: Secondary | ICD-10-CM

## 2018-05-29 ENCOUNTER — Other Ambulatory Visit (INDEPENDENT_AMBULATORY_CARE_PROVIDER_SITE_OTHER): Payer: Self-pay | Admitting: Vascular Surgery

## 2018-05-29 DIAGNOSIS — I739 Peripheral vascular disease, unspecified: Secondary | ICD-10-CM

## 2018-05-31 ENCOUNTER — Encounter (INDEPENDENT_AMBULATORY_CARE_PROVIDER_SITE_OTHER): Payer: Self-pay | Admitting: Vascular Surgery

## 2018-05-31 ENCOUNTER — Ambulatory Visit (INDEPENDENT_AMBULATORY_CARE_PROVIDER_SITE_OTHER): Payer: Medicare Other

## 2018-05-31 ENCOUNTER — Ambulatory Visit (INDEPENDENT_AMBULATORY_CARE_PROVIDER_SITE_OTHER): Payer: Medicare Other | Admitting: Vascular Surgery

## 2018-05-31 VITALS — BP 149/87 | HR 77 | Resp 18 | Ht 62.0 in | Wt 126.0 lb

## 2018-05-31 DIAGNOSIS — L97529 Non-pressure chronic ulcer of other part of left foot with unspecified severity: Secondary | ICD-10-CM

## 2018-05-31 DIAGNOSIS — I10 Essential (primary) hypertension: Secondary | ICD-10-CM | POA: Diagnosis not present

## 2018-05-31 DIAGNOSIS — I739 Peripheral vascular disease, unspecified: Secondary | ICD-10-CM | POA: Diagnosis not present

## 2018-05-31 DIAGNOSIS — I7025 Atherosclerosis of native arteries of other extremities with ulceration: Secondary | ICD-10-CM

## 2018-05-31 DIAGNOSIS — E785 Hyperlipidemia, unspecified: Secondary | ICD-10-CM | POA: Insufficient documentation

## 2018-05-31 DIAGNOSIS — I70268 Atherosclerosis of native arteries of extremities with gangrene, other extremity: Secondary | ICD-10-CM

## 2018-05-31 DIAGNOSIS — Z9862 Peripheral vascular angioplasty status: Secondary | ICD-10-CM | POA: Diagnosis not present

## 2018-05-31 NOTE — Progress Notes (Signed)
Subjective:    Patient ID: Melissa Cooper, female    DOB: [birth year], 84 y.o.   MRN: [id number] Chief Complaint  Patient presents with  . Follow-up    1 month [facility] ABI   Patient presents for her first post procedure follow-up.  The patient is status post a left lower extremity angiogram with intervention on April 13, 2018 for atherosclerotic disease with gangrene of the first toe.  The patient is seen with her son and his wife.  The patient does not provide much of a history however is sitting very comfortably with her legs crossed in her wheelchair.  Notes that there has been minimal improvement to the gangrenous first left toe.  They have not seen a podiatrist since 10/03/2017 or underwent any wound care to the area.  The patient does reside at [facility].  The patient underwent a bilateral ABI which was notable for right: 1.19, biphasic tibial waveforms. Left: 1.11, biphasic tibial waveforms.  No evidence of significant bilateral lower extremity arterial disease seen on ABI today.  There is no worsening in the patient's wound.  There is no fever, nausea vomiting.  There is no cellulitis.  Review of Systems  Constitutional: Negative.   HENT: Negative.   Eyes: Negative.   Respiratory: Negative.   Cardiovascular: Negative.   Gastrointestinal: Negative.   Endocrine: Negative.   Genitourinary: Negative.   Musculoskeletal: Negative.   Skin: Positive for wound.  Allergic/Immunologic: Negative.   Neurological: Negative.   Hematological: Negative.   Psychiatric/Behavioral: Negative.       Objective:   Physical Exam  Constitutional: She is oriented to person, place, and time. She appears well-developed and well-nourished. No distress.  HENT:  Head: Normocephalic and atraumatic.  Right Ear: External ear normal.  Left Ear: External ear normal.  Eyes: Pupils are equal, round, and reactive to light. Conjunctivae and EOM are normal.  Neck: Normal range of motion.  Cardiovascular: Normal  rate, regular rhythm and normal heart sounds.  Pulses:      Radial pulses are 2+ on the right side, and 2+ on the left side.  Hard to palpate pedal pulses however the bilateral feet are warm.  Good capillary refill.  Pulmonary/Chest: Effort normal and breath sounds normal.  Musculoskeletal: Normal range of motion. She exhibits no edema.  Neurological: She is alert and oriented to person, place, and time.  Skin: She is not diaphoretic.  Gangrenous left first toe.  Gangrene is noted about midway to the distal aspect of the great left first toe.  There is no drainage, cellulitis or foul odor.  Psychiatric: She has a normal mood and affect. Her behavior is normal. Judgment and thought content normal.  Vitals reviewed.  BP (!) 149/87 (BP Location: Right Arm, Patient Position: Sitting)   Pulse 77   Resp 18   Ht 5\' 2"  (1.575 m)   Wt 126 lb (57.2 kg)   LMP  (LMP Unknown)   BMI 23.05 kg/m   Past Medical History:  Diagnosis Date  . CKD (chronic kidney disease)    stage 3  . Dementia (HCC)   . HTN (hypertension)   . Hypercholesteremia   . OSA (obstructive sleep apnea)   . Osteoarthritis   . Osteoarthritis   . PA (pernicious anemia)    Social History   Socioeconomic History  . Marital status: Married    Spouse name: Not on file  . Number of children: Not on file  . Years of education: Not on file  .  Highest education level: Not on file  Occupational History  . Not on file  Social Needs  . Financial resource strain: Not on file  . Food insecurity:    Worry: Not on file    Inability: Not on file  . Transportation needs:    Medical: Not on file    Non-medical: Not on file  Tobacco Use  . Smoking status: Never Smoker  . Smokeless tobacco: Never Used  Substance and Sexual Activity  . Alcohol use: No  . Drug use: No  . Sexual activity: Never    Birth control/protection: Post-menopausal  Lifestyle  . Physical activity:    Days per week: Not on file    Minutes per session:  Not on file  . Stress: Not on file  Relationships  . Social connections:    Talks on phone: Not on file    Gets together: Not on file    Attends religious service: Not on file    Active member of club or organization: Not on file    Attends meetings of clubs or organizations: Not on file    Relationship status: Not on file  . Intimate partner violence:    Fear of current or ex partner: Not on file    Emotionally abused: Not on file    Physically abused: Not on file    Forced sexual activity: Not on file  Other Topics Concern  . Not on file  Social History Narrative   From [facility]. Mostly bedbound, can get up with assistance.   Past Surgical History:  Procedure Laterality Date  . INTRAMEDULLARY (IM) NAIL INTERTROCHANTERIC Left 06/12/2016   Procedure: INTRAMEDULLARY (IM) NAIL INTERTROCHANTRIC;  Surgeon: Christena Flake, MD;  Location: [facility] ORS;  Service: Orthopedics;  Laterality: Left;  . LOWER EXTREMITY ANGIOGRAPHY Left 04/03/2018   Procedure: LOWER EXTREMITY ANGIOGRAPHY;  Surgeon: Annice Needy, MD;  Location: [facility] INVASIVE CV LAB;  Service: Cardiovascular;  Laterality: Left;  . none     History reviewed. No pertinent family history.  Allergies  Allergen Reactions  . Actonel [Risedronate Sodium] Other (See Comments)    Unknown reaction  . Ceftin [Cefuroxime Axetil] Other (See Comments)    Unknown reaction  . Evista [Raloxifene] Other (See Comments)    Unknown reaction  . Miacalcin [Calcitonin ([place])] Other (See Comments)    Unknown reaction  . Zyrtec [Cetirizine] Other (See Comments)    Unknown reaction      Assessment & Plan:  Patient presents for her first post procedure follow-up.  The patient is status post a left lower extremity angiogram with intervention on April 13, 2018 for atherosclerotic disease with gangrene of the first toe.  The patient is seen with her son and his wife.  The patient does not provide much of a history however is sitting very comfortably  with her legs crossed in her wheelchair.  Notes that there has been minimal improvement to the gangrenous first left toe.  They have not seen a podiatrist since 10/03/2017 or underwent any wound care to the area.  The patient does reside at [facility].  The patient underwent a bilateral ABI which was notable for right: 1.19, biphasic tibial waveforms. Left: 1.11, biphasic tibial waveforms.  No evidence of significant bilateral lower extremity arterial disease seen on ABI today.  There is no worsening in the patient's wound.  There is no fever, nausea vomiting.  There is no cellulitis.  1. Atherosclerosis of native arteries of the extremities with ulceration (  HCC) - Stable The patient is status post a left lower extremity angiogram with intervention on April 13, 2018 Post intervention ABI now with normal arterial blood flow bilaterally The patient's family has not taken her back to a podiatrist for further evaluation of her first foot gangrene. The patient is also not been given any local wound care at a wound center Strongly recommended the patient to follow up with their podiatrist they originally saw Dr. Stacie Acres at [facility] I also offered to send a referral to the podiatrist at [facility] clinic Family will reach out to podiatrist for follow-up care in regard to either debridement/amputation of the patient's toe The patient is to follow-up with [place] in 6 months and undergo a ABI to surveilled her peripheral artery disease  - VAS [place] ABI WITH/WO TBI; Future  2. Hyperlipidemia, unspecified hyperlipidemia type - Stable On ASA, Plavix and statin. Encouraged good control as its slows the progression of atherosclerotic disease  3. Essential hypertension - Stable Encouraged good control as its slows the progression of atherosclerotic disease  Current Outpatient Medications on File Prior to Visit  Medication Sig Dispense Refill  . acetaminophen (ACETAMINOPHEN 8 HOUR) 650 MG CR tablet Take  650 mg by mouth every 8 (eight) hours as needed for pain.    Marland Kitchen aspirin EC 81 MG tablet Take 81 mg by mouth daily.    Marland Kitchen atorvastatin (LIPITOR) 10 MG tablet Take 1 tablet (10 mg total) by mouth daily. 30 tablet 11  . benzonatate (TESSALON) 100 MG capsule Take 1 capsule (100 mg total) by mouth 3 (three) times daily as needed for cough. 20 capsule 0  . clopidogrel (PLAVIX) 75 MG tablet Take 1 tablet (75 mg total) by mouth daily. 30 tablet 11  . Melatonin 3 MG CAPS Take 1 capsule by mouth at bedtime.     . metoprolol succinate (TOPROL-XL) 50 MG 24 hr tablet Take 50 mg by mouth daily. Take with or immediately following a meal.    . Nutritional Supplements (ENSURE CLEAR) LIQD Take 1 Package by mouth 2 (two) times daily. Between meals    . nystatin (NYSTATIN) powder Apply topically 2 (two) times daily as needed (rash (under breasts and groin area)).     . polyethylene glycol (MIRALAX / GLYCOLAX) packet Take 17 g by mouth daily as needed for mild constipation. 14 each 0  . traMADol (ULTRAM) 50 MG tablet Take 50 mg by mouth every 8 (eight) hours as needed (pain).    . vitamin B-12 1000 MCG tablet Take 1 tablet (1,000 mcg total) by mouth daily. 30 tablet 0  . acidophilus (RISAQUAD) CAPS capsule Take 1 capsule by mouth 2 (two) times daily. (Patient not taking: Reported on 05/31/2018) 60 capsule 3  . docusate sodium (COLACE) 100 MG capsule Take 1 capsule (100 mg total) by mouth 2 (two) times daily. (Patient not taking: Reported on 03/30/2018) 10 capsule 0  . fluticasone (FLONASE) 50 MCG/ACT nasal spray Place 1 spray into both nostrils daily. 16 g 0  . metoprolol tartrate (LOPRESSOR) 25 MG tablet Take 1 tablet (25 mg total) by mouth 3 (three) times daily. HOLD IF HR<60 or SBP<100 (Patient not taking: Reported on 03/30/2018) 90 tablet 2   No current facility-administered medications on file prior to visit.    There are no Patient Instructions on file for this visit. No follow-ups on file.  Tamorah Hada A Shelise Maron,  PA-C

## 2018-07-06 DIAGNOSIS — I739 Peripheral vascular disease, unspecified: Secondary | ICD-10-CM

## 2018-07-06 DIAGNOSIS — I10 Essential (primary) hypertension: Secondary | ICD-10-CM

## 2018-07-06 DIAGNOSIS — M159 Polyosteoarthritis, unspecified: Secondary | ICD-10-CM

## 2018-07-06 DIAGNOSIS — F0150 Vascular dementia without behavioral disturbance: Secondary | ICD-10-CM | POA: Diagnosis not present

## 2018-07-06 DIAGNOSIS — N183 Chronic kidney disease, stage 3 (moderate): Secondary | ICD-10-CM | POA: Diagnosis not present

## 2018-07-11 DIAGNOSIS — L03116 Cellulitis of left lower limb: Secondary | ICD-10-CM

## 2018-08-04 ENCOUNTER — Other Ambulatory Visit (INDEPENDENT_AMBULATORY_CARE_PROVIDER_SITE_OTHER): Payer: Self-pay | Admitting: Vascular Surgery

## 2018-08-04 ENCOUNTER — Ambulatory Visit (INDEPENDENT_AMBULATORY_CARE_PROVIDER_SITE_OTHER): Payer: Medicare Other

## 2018-08-04 ENCOUNTER — Encounter (INDEPENDENT_AMBULATORY_CARE_PROVIDER_SITE_OTHER): Payer: Self-pay

## 2018-08-04 ENCOUNTER — Ambulatory Visit (INDEPENDENT_AMBULATORY_CARE_PROVIDER_SITE_OTHER): Payer: Medicare Other | Admitting: Vascular Surgery

## 2018-08-04 DIAGNOSIS — I96 Gangrene, not elsewhere classified: Secondary | ICD-10-CM

## 2018-08-04 DIAGNOSIS — I7025 Atherosclerosis of native arteries of other extremities with ulceration: Secondary | ICD-10-CM

## 2018-08-04 NOTE — Progress Notes (Signed)
Patient was unable to tolerate ABIs with toe pressures today due to pain from her gangrenous great toe.  At this point, I called her podiatrist and we discussed options.  Her perfusion was pretty normal 2 months ago and is likely adequate for wound healing at a digital amputation level.  He is going to get her in and do a digital amputation and if this does not heal we should probably consider proximal amputation at that point.  Dr. Irene LimboFowler's office will contact the patient to schedule digital amputation.

## 2018-08-08 ENCOUNTER — Inpatient Hospital Stay
Admission: RE | Admit: 2018-08-08 | Discharge: 2018-08-11 | DRG: 256 | Disposition: A | Discharge status: Skilled Nursing Facility | Payer: Medicare Other | Source: Ambulatory Visit | Attending: Internal Medicine | Admitting: Internal Medicine

## 2018-08-08 ENCOUNTER — Other Ambulatory Visit: Payer: Self-pay

## 2018-08-08 DIAGNOSIS — E785 Hyperlipidemia, unspecified: Secondary | ICD-10-CM | POA: Diagnosis present

## 2018-08-08 DIAGNOSIS — E7800 Pure hypercholesterolemia, unspecified: Secondary | ICD-10-CM | POA: Diagnosis present

## 2018-08-08 DIAGNOSIS — M1990 Unspecified osteoarthritis, unspecified site: Secondary | ICD-10-CM | POA: Diagnosis present

## 2018-08-08 DIAGNOSIS — Z7902 Long term (current) use of antithrombotics/antiplatelets: Secondary | ICD-10-CM | POA: Diagnosis not present

## 2018-08-08 DIAGNOSIS — Z888 Allergy status to other drugs, medicaments and biological substances status: Secondary | ICD-10-CM

## 2018-08-08 DIAGNOSIS — N183 Chronic kidney disease, stage 3 (moderate): Secondary | ICD-10-CM | POA: Diagnosis present

## 2018-08-08 DIAGNOSIS — F0390 Unspecified dementia without behavioral disturbance: Secondary | ICD-10-CM | POA: Diagnosis present

## 2018-08-08 DIAGNOSIS — D510 Vitamin B12 deficiency anemia due to intrinsic factor deficiency: Secondary | ICD-10-CM | POA: Diagnosis present

## 2018-08-08 DIAGNOSIS — N179 Acute kidney failure, unspecified: Secondary | ICD-10-CM | POA: Diagnosis not present

## 2018-08-08 DIAGNOSIS — Z7982 Long term (current) use of aspirin: Secondary | ICD-10-CM

## 2018-08-08 DIAGNOSIS — I96 Gangrene, not elsewhere classified: Secondary | ICD-10-CM | POA: Diagnosis present

## 2018-08-08 DIAGNOSIS — Z66 Do not resuscitate: Secondary | ICD-10-CM | POA: Diagnosis present

## 2018-08-08 DIAGNOSIS — G4733 Obstructive sleep apnea (adult) (pediatric): Secondary | ICD-10-CM | POA: Diagnosis present

## 2018-08-08 DIAGNOSIS — I129 Hypertensive chronic kidney disease with stage 1 through stage 4 chronic kidney disease, or unspecified chronic kidney disease: Secondary | ICD-10-CM | POA: Diagnosis present

## 2018-08-08 DIAGNOSIS — Z881 Allergy status to other antibiotic agents status: Secondary | ICD-10-CM

## 2018-08-08 LAB — BASIC METABOLIC PANEL
Anion gap: 7 (ref 5–15)
BUN: 33 mg/dL — ABNORMAL HIGH (ref 8–23)
CO2: 29 mmol/L (ref 22–32)
Calcium: 9 mg/dL (ref 8.9–10.3)
Chloride: 103 mmol/L (ref 98–111)
Creatinine, Ser: 1.16 mg/dL — ABNORMAL HIGH (ref 0.44–1.00)
GFR calc Af Amer: 45 mL/min — ABNORMAL LOW (ref >60)
GFR calc non Af Amer: 39 mL/min — ABNORMAL LOW (ref >60)
Glucose, Bld: 98 mg/dL (ref 70–99)
POTASSIUM: 3.7 mmol/L (ref 3.5–5.1)
Sodium: 139 mmol/L (ref 135–145)

## 2018-08-08 LAB — CBC
HCT: 35.5 % — ABNORMAL LOW (ref 36.0–46.0)
Hemoglobin: 11.2 g/dL — ABNORMAL LOW (ref 12.0–15.0)
MCH: 27.3 pg (ref 26.0–34.0)
MCHC: 31.5 g/dL (ref 30.0–36.0)
MCV: 86.6 fL (ref 80.0–100.0)
NRBC: 0 % (ref 0.0–0.2)
Platelets: 256 10*3/uL (ref 150–400)
RBC: 4.1 MIL/uL (ref 3.87–5.11)
RDW: 12.4 % (ref 11.5–15.5)
WBC: 7.7 10*3/uL (ref 4.0–10.5)

## 2018-08-08 LAB — MRSA PCR SCREENING: MRSA by PCR: POSITIVE — AB

## 2018-08-08 MED ORDER — ONDANSETRON HCL 4 MG/2ML IJ SOLN: 4.0000 mg | Freq: Four times a day (QID) | INTRAMUSCULAR | Status: DC | PRN

## 2018-08-08 MED ORDER — BENZONATATE 100 MG PO CAPS: 100.0000 mg | ORAL_CAPSULE | Freq: Three times a day (TID) | ORAL | Status: DC | PRN

## 2018-08-08 MED ORDER — CHLORHEXIDINE GLUCONATE CLOTH 2 % EX PADS
6.0000 | MEDICATED_PAD | Freq: Every day | CUTANEOUS | Status: DC
  Administered 2018-08-09 – 2018-08-11 (×3): 6 via TOPICAL

## 2018-08-08 MED ORDER — RISAQUAD PO CAPS
1.0000 | ORAL_CAPSULE | Freq: Every day | ORAL | Status: DC
  Administered 2018-08-08 – 2018-08-11 (×3): 1 via ORAL
  Filled 2018-08-08 (×3): qty 1

## 2018-08-08 MED ORDER — MUPIROCIN 2 % EX OINT
1.0000 "application " | TOPICAL_OINTMENT | Freq: Two times a day (BID) | CUTANEOUS | Status: DC
  Administered 2018-08-08 – 2018-08-11 (×6): 1 via NASAL
  Filled 2018-08-08: qty 22

## 2018-08-08 MED ORDER — ALBUTEROL SULFATE (2.5 MG/3ML) 0.083% IN NEBU: 2.5000 mg | INHALATION_SOLUTION | RESPIRATORY_TRACT | Status: DC | PRN

## 2018-08-08 MED ORDER — POVIDONE-IODINE 10 % EX SWAB: 2.0000 "application " | Freq: Once | CUTANEOUS | Status: DC

## 2018-08-08 MED ORDER — ONDANSETRON HCL 4 MG PO TABS: 4.0000 mg | ORAL_TABLET | Freq: Four times a day (QID) | ORAL | Status: DC | PRN

## 2018-08-08 MED ORDER — SODIUM CHLORIDE 0.9 % IV SOLN
INTRAVENOUS | Status: DC | PRN
  Administered 2018-08-08: 250 mL via INTRAVENOUS

## 2018-08-08 MED ORDER — CLINDAMYCIN PHOSPHATE 300 MG/50ML IV SOLN
300.0000 mg | INTRAVENOUS | Status: AC
  Administered 2018-08-09: 300 mg via INTRAVENOUS
  Filled 2018-08-08: qty 50

## 2018-08-08 MED ORDER — ACETAMINOPHEN 325 MG PO TABS
650.0000 mg | ORAL_TABLET | Freq: Four times a day (QID) | ORAL | Status: DC | PRN
  Administered 2018-08-08: 650 mg via ORAL
  Filled 2018-08-08: qty 2

## 2018-08-08 MED ORDER — VANCOMYCIN HCL IN DEXTROSE 1-5 GM/200ML-% IV SOLN
1000.0000 mg | Freq: Once | INTRAVENOUS | Status: AC
  Administered 2018-08-08: 1000 mg via INTRAVENOUS
  Filled 2018-08-08: qty 200

## 2018-08-08 MED ORDER — VITAMIN B-12 1000 MCG PO TABS
500.0000 ug | ORAL_TABLET | Freq: Every day | ORAL | Status: DC
  Administered 2018-08-08 – 2018-08-11 (×3): 500 ug via ORAL
  Filled 2018-08-08 (×3): qty 1

## 2018-08-08 MED ORDER — SENNOSIDES-DOCUSATE SODIUM 8.6-50 MG PO TABS: 1.0000 | ORAL_TABLET | Freq: Every evening | ORAL | Status: DC | PRN

## 2018-08-08 MED ORDER — KETOROLAC TROMETHAMINE 15 MG/ML IJ SOLN
15.0000 mg | Freq: Four times a day (QID) | INTRAMUSCULAR | Status: DC | PRN
  Administered 2018-08-09 – 2018-08-10 (×3): 15 mg via INTRAVENOUS
  Filled 2018-08-08 (×3): qty 1

## 2018-08-08 MED ORDER — METOPROLOL SUCCINATE ER 50 MG PO TB24
50.0000 mg | ORAL_TABLET | Freq: Every day | ORAL | Status: DC
  Administered 2018-08-08 – 2018-08-11 (×4): 50 mg via ORAL
  Filled 2018-08-08 (×4): qty 1

## 2018-08-08 MED ORDER — BISACODYL 5 MG PO TBEC: 5.0000 mg | DELAYED_RELEASE_TABLET | Freq: Every day | ORAL | Status: DC | PRN

## 2018-08-08 MED ORDER — MELATONIN 5 MG PO TABS
2.5000 mg | ORAL_TABLET | Freq: Every day | ORAL | Status: DC
  Administered 2018-08-08 – 2018-08-10 (×3): 2.5 mg via ORAL
  Filled 2018-08-08 (×4): qty 0.5

## 2018-08-08 MED ORDER — PIPERACILLIN-TAZOBACTAM 3.375 G IVPB
3.3750 g | Freq: Two times a day (BID) | INTRAVENOUS | Status: DC
  Administered 2018-08-08 – 2018-08-10 (×5): 3.375 g via INTRAVENOUS
  Filled 2018-08-08 (×5): qty 50

## 2018-08-08 MED ORDER — CHLORHEXIDINE GLUCONATE 4 % EX LIQD: 60.0000 mL | Freq: Once | CUTANEOUS | Status: DC

## 2018-08-08 MED ORDER — ALIGN 4 MG PO CAPS: 4.0000 mg | ORAL_CAPSULE | Freq: Every day | ORAL | Status: DC

## 2018-08-08 MED ORDER — ATORVASTATIN CALCIUM 10 MG PO TABS
10.0000 mg | ORAL_TABLET | Freq: Every day | ORAL | Status: DC
  Administered 2018-08-08 – 2018-08-10 (×3): 10 mg via ORAL
  Filled 2018-08-08 (×3): qty 1

## 2018-08-08 MED ORDER — HYDROCODONE-ACETAMINOPHEN 5-325 MG PO TABS
1.0000 | ORAL_TABLET | ORAL | Status: DC | PRN
  Administered 2018-08-10 (×3): 1 via ORAL
  Filled 2018-08-08 (×4): qty 1

## 2018-08-08 MED ORDER — PIPERACILLIN-TAZOBACTAM 3.375 G IVPB: 3.3750 g | Freq: Three times a day (TID) | INTRAVENOUS | Status: DC

## 2018-08-08 MED ORDER — VANCOMYCIN HCL IN DEXTROSE 750-5 MG/150ML-% IV SOLN
750.0000 mg | INTRAVENOUS | Status: DC
  Administered 2018-08-10: 750 mg via INTRAVENOUS
  Filled 2018-08-08 (×2): qty 150

## 2018-08-08 MED ORDER — ACETAMINOPHEN 650 MG RE SUPP: 650.0000 mg | Freq: Four times a day (QID) | RECTAL | Status: DC | PRN

## 2018-08-08 MED ORDER — SODIUM CHLORIDE 0.9 % IV SOLN
INTRAVENOUS | Status: DC
  Administered 2018-08-08 – 2018-08-11 (×3): via INTRAVENOUS

## 2018-08-08 NOTE — Progress Notes (Signed)
Pharmacy Antibiotic Note  Melissa Cooper is a 84 y.o. female admitted on 08/08/2018 with gangrene of left great toe.  Pharmacy has been consulted for Vancomycin and Zosyn dosing.  Ke=0.021 T1/2=33hr Vd=37.3L  Plan: Zosyn 3.375g IV q12h  Vancomycin 1g IV ordered. Will begin Vancomycin 750mg  IV q36h on 12/12 00:00 (30 hour stacked dose). Will check a trough level prior to 3rd to ensure patient is clearing adequately (this will not be a steady state level).  Height: 5' (152.4 cm) Weight: 117 lb 8 oz (53.3 kg) IBW/kg (Calculated) : 45.5  Temp (24hrs), Avg:97.6 F (36.4 C), Min:97.6 F (36.4 C), Max:97.6 F (36.4 C)  Recent Labs  Lab 08/08/18 1658  WBC 7.7  CREATININE 1.16*    Estimated Creatinine Clearance: 19.4 mL/min (A) (by C-G formula based on SCr of 1.16 mg/dL (H)).    Allergies  Allergen Reactions  . Actonel [Risedronate Sodium] Other (See Comments)    Unknown reaction  . Ceftin [Cefuroxime Axetil] Other (See Comments)    Unknown reaction  . Evista [Raloxifene] Other (See Comments)    Unknown reaction  . Miacalcin [Calcitonin ([place])] Other (See Comments)    Unknown reaction  . Zyrtec [Cetirizine] Other (See Comments)    Unknown reaction    Antimicrobials this admission: Vancomycin 12/10 >>  Zosyn 12/10 >>   Thank you for allowing pharmacy to be a part of this patient's care.  Clovia CuffLisa Gailya Tauer, PharmD, BCPS 08/08/2018 7:27 PM

## 2018-08-08 NOTE — Consult Note (Signed)
ORTHOPAEDIC CONSULTATION  REQUESTING PHYSICIAN: Shaune Pollackhen, Qing, MD  Chief Complaint: Left great toe gangrene.  HPI: Melissa Cooper is a [age] y.o. female who complains of  Gangrene left great toe with pain.  Seen by vascular and angio performed.  Gangrene has stabilized and plan for amputation tomorrow.   Past Medical History:  Diagnosis Date  . CKD (chronic kidney disease)    stage 3  . Dementia (HCC)   . HTN (hypertension)   . Hypercholesteremia   . OSA (obstructive sleep apnea)   . Osteoarthritis   . Osteoarthritis   . PA (pernicious anemia)    Past Surgical History:  Procedure Laterality Date  . INTRAMEDULLARY (IM) NAIL INTERTROCHANTERIC Left 06/12/2016   Procedure: INTRAMEDULLARY (IM) NAIL INTERTROCHANTRIC;  Surgeon: Christena FlakeJohn J Poggi, MD;  Location: [facility] ORS;  Service: Orthopedics;  Laterality: Left;  . LOWER EXTREMITY ANGIOGRAPHY Left 04/03/2018   Procedure: LOWER EXTREMITY ANGIOGRAPHY;  Surgeon: Annice Needyew, Jason S, MD;  Location: [facility] INVASIVE CV LAB;  Service: Cardiovascular;  Laterality: Left;  . none     Social History   Socioeconomic History  . Marital status: Married    Spouse name: Not on file  . Number of children: Not on file  . Years of education: Not on file  . Highest education level: Not on file  Occupational History  . Not on file  Social Needs  . Financial resource strain: Not on file  . Food insecurity:    Worry: Not on file    Inability: Not on file  . Transportation needs:    Medical: Not on file    Non-medical: Not on file  Tobacco Use  . Smoking status: Never Smoker  . Smokeless tobacco: Never Used  Substance and Sexual Activity  . Alcohol use: No  . Drug use: No  . Sexual activity: Never    Birth control/protection: Post-menopausal  Lifestyle  . Physical activity:    Days per week: Not on file    Minutes per session: Not on file  . Stress: Not on file  Relationships  . Social connections:    Talks on phone: Not on file    Gets together: Not on  file    Attends religious service: Not on file    Active member of club or organization: Not on file    Attends meetings of clubs or organizations: Not on file    Relationship status: Not on file  Other Topics Concern  . Not on file  Social History Narrative   From [facility]. Mostly bedbound, can get up with assistance.   History reviewed. No pertinent family history. Allergies  Allergen Reactions  . Actonel [Risedronate Sodium] Other (See Comments)    Unknown reaction  . Ceftin [Cefuroxime Axetil] Other (See Comments)    Unknown reaction  . Evista [Raloxifene] Other (See Comments)    Unknown reaction  . Miacalcin [Calcitonin ([place])] Other (See Comments)    Unknown reaction  . Zyrtec [Cetirizine] Other (See Comments)    Unknown reaction   Prior to Admission medications   Medication Sig Start Date End Date Taking? Authorizing Provider  acetaminophen (ACETAMINOPHEN 8 HOUR) 650 MG CR tablet Take 650 mg by mouth 3 (three) times daily.    Yes [provider]  aspirin EC 81 MG tablet Take 81 mg by mouth daily at 8 pm. (2000)   Yes [provider]  atorvastatin (LIPITOR) 10 MG tablet Take 1 tablet (10 mg total) by mouth daily. Patient taking differently:  Take 10 mg by mouth daily at 8 pm. (2000) 04/03/18 04/03/19 Yes Dew, Marlow Baars, MD  benzonatate (TESSALON) 100 MG capsule Take 1 capsule (100 mg total) by mouth 3 (three) times daily as needed for cough. 10/06/15  Yes Enid Baas, MD  clopidogrel (PLAVIX) 75 MG tablet Take 1 tablet (75 mg total) by mouth daily. Patient taking differently: Take 75 mg by mouth daily at 8 pm. (2000) 04/03/18  Yes Dew, Marlow Baars, MD  doxycycline (VIBRA-TABS) 100 MG tablet Take 100 mg by mouth 2 (two) times daily.   Yes [provider]  Melatonin 3 MG CAPS Take 3 mg by mouth at bedtime. (2000)   Yes [provider]  metoprolol succinate (TOPROL-XL) 50 MG 24 hr tablet Take 50 mg by mouth daily. (0800)Take with or  immediately following a meal.   Yes [provider]  Nutritional Supplements (NUTRITIONAL DRINK PO) Take 120 mLs by mouth 2 (two) times daily between meals. MEDPASS   Yes [provider]  nystatin (NYSTATIN) powder Apply 2 g topically 2 (two) times daily as needed (rash (under breasts and groin area)).    Yes [provider]  polyethylene glycol (MIRALAX / GLYCOLAX) packet Take 17 g by mouth daily as needed for mild constipation. 06/15/16  Yes Enedina Finner, MD  Probiotic Product (ALIGN) 4 MG CAPS Take 4 mg by mouth daily. (0800)   Yes [provider]  traMADol (ULTRAM) 50 MG tablet Take 50 mg by mouth every 8 (eight) hours as needed (pain).   Yes [provider]  vitamin B-12 (CYANOCOBALAMIN) 500 MCG tablet Take 500 mcg by mouth daily. (0800)   Yes [provider]   No results found.  Positive ROS: Pt minimally ambulates.  Not able to provide ROS.  Dementia.  Physical Exam: General: Pt not oriented to place/time. Alert.  Family gives ROS Vascular:  Left foot:Dorsalis Pedis:  absent Posterior Tibial:  absent  Right foot: Dorsalis Pedis:  absent Posterior Tibial:  absent  Neuro:intact Sensation  Derm:Right foot without issue.  Left foot with gangrene to distal left great toe.  Mild surrounding erythema.  Ortho/MS: No edema   Assessment: Gangrene left great toe  Plan: Plan for surgery tomorrow. NPO after midnight. D/w anesthesia. D/W family and understand high risk for amputation as well as anesthesia risk.     Irean Hong, DPM Cell [phone]   08/08/2018 5:15 PM

## 2018-08-08 NOTE — H&P (Signed)
[facility] [place] at [facility]   PATIENT NAME: Melissa Cooper    MR#:  [id number]  DATE OF BIRTH:  01-26-1918  DATE OF ADMISSION:  08/08/2018  PRIMARY CARE PHYSICIAN: Karie Schwalbe, MD   REQUESTING/REFERRING PHYSICIAN: Dr. Ether Griffins.  CHIEF COMPLAINT:  No chief complaint on file.  Big toe gangrene of left foot. HISTORY OF PRESENT ILLNESS:  Melissa Cooper  is a 84 y.o. female with a known history of hypertension, hyperlipidemia, CKD stage III, OSA, osteoarthritis, pernicious anemia and dementia.  The patient is sent to the hospital for direct admission due to big toe gangrene of left foot by Dr. Ether Griffins.  The patient is demented and not a good historian.  She has no complaints and does not know why she is in the hospital.  According to Dr. Ether Griffins and his sons, the patient has left big toe gangrene, which is worsening and painful.  She could not tolerate ABI with toe pressures several days ago.  Saw Dr. Ether Griffins sent the patient to the hospital to get digital amputation.  PAST MEDICAL HISTORY:   Past Medical History:  Diagnosis Date  . CKD (chronic kidney disease)    stage 3  . Dementia (HCC)   . HTN (hypertension)   . Hypercholesteremia   . OSA (obstructive sleep apnea)   . Osteoarthritis   . Osteoarthritis   . PA (pernicious anemia)     PAST SURGICAL HISTORY:   Past Surgical History:  Procedure Laterality Date  . INTRAMEDULLARY (IM) NAIL INTERTROCHANTERIC Left 06/12/2016   Procedure: INTRAMEDULLARY (IM) NAIL INTERTROCHANTRIC;  Surgeon: Christena Flake, MD;  Location: [facility] ORS;  Service: Orthopedics;  Laterality: Left;  . LOWER EXTREMITY ANGIOGRAPHY Left 04/03/2018   Procedure: LOWER EXTREMITY ANGIOGRAPHY;  Surgeon: Annice Needy, MD;  Location: [facility] INVASIVE CV LAB;  Service: Cardiovascular;  Laterality: Left;  . none      SOCIAL HISTORY:   Social History   Tobacco Use  . Smoking status: Never Smoker  . Smokeless tobacco: Never Used  Substance Use  Topics  . Alcohol use: No    FAMILY HISTORY:  History reviewed. No pertinent family history.  Unable to obtain due to dementia.  DRUG ALLERGIES:   Allergies  Allergen Reactions  . Actonel [Risedronate Sodium] Other (See Comments)    Unknown reaction  . Ceftin [Cefuroxime Axetil] Other (See Comments)    Unknown reaction  . Evista [Raloxifene] Other (See Comments)    Unknown reaction  . Miacalcin [Calcitonin ([place])] Other (See Comments)    Unknown reaction  . Zyrtec [Cetirizine] Other (See Comments)    Unknown reaction    REVIEW OF SYSTEMS:   Review of Systems  Unable to perform ROS: Dementia    MEDICATIONS AT HOME:   Prior to Admission medications   Medication Sig Start Date End Date Taking? Authorizing Provider  acetaminophen (ACETAMINOPHEN 8 HOUR) 650 MG CR tablet Take 650 mg by mouth 3 (three) times daily.    Yes [provider]  aspirin EC 81 MG tablet Take 81 mg by mouth daily at 8 pm. (2000)   Yes [provider]  atorvastatin (LIPITOR) 10 MG tablet Take 1 tablet (10 mg total) by mouth daily. Patient taking differently: Take 10 mg by mouth daily at 8 pm. (2000) 04/03/18 04/03/19 Yes Dew, Marlow Baars, MD  benzonatate (TESSALON) 100 MG capsule Take 1 capsule (100 mg total) by mouth 3 (three) times daily as needed for cough. 10/06/15  Yes Enid Baas,  MD  clopidogrel (PLAVIX) 75 MG tablet Take 1 tablet (75 mg total) by mouth daily. Patient taking differently: Take 75 mg by mouth daily at 8 pm. (2000) 04/03/18  Yes Dew, Marlow BaarsJason S, MD  doxycycline (VIBRA-TABS) 100 MG tablet Take 100 mg by mouth 2 (two) times daily.   Yes [provider]  Melatonin 3 MG CAPS Take 3 mg by mouth at bedtime. (2000)   Yes [provider]  metoprolol succinate (TOPROL-XL) 50 MG 24 hr tablet Take 50 mg by mouth daily. (0800)Take with or immediately following a meal.   Yes [provider]  Nutritional Supplements (NUTRITIONAL DRINK PO) Take 120 mLs by mouth 2  (two) times daily between meals. MEDPASS   Yes [provider]  nystatin (NYSTATIN) powder Apply 2 g topically 2 (two) times daily as needed (rash (under breasts and groin area)).    Yes [provider]  polyethylene glycol (MIRALAX / GLYCOLAX) packet Take 17 g by mouth daily as needed for mild constipation. 06/15/16  Yes Enedina FinnerPatel, Sona, MD  Probiotic Product (ALIGN) 4 MG CAPS Take 4 mg by mouth daily. (0800)   Yes [provider]  traMADol (ULTRAM) 50 MG tablet Take 50 mg by mouth every 8 (eight) hours as needed (pain).   Yes [provider]  vitamin B-12 (CYANOCOBALAMIN) 500 MCG tablet Take 500 mcg by mouth daily. (0800)   Yes [provider]      VITAL SIGNS:  Blood pressure (!) 153/80, pulse 74, temperature 97.6 F (36.4 C), temperature source Oral, resp. rate 16, SpO2 99 %.  PHYSICAL EXAMINATION:  Physical Exam  GENERAL:  84 y.o.-year-old patient lying in the bed with no acute distress.  EYES: Pupils equal, round, reactive to light and accommodation. No scleral icterus. Extraocular muscles intact.  HEENT: Head atraumatic, normocephalic. Oropharynx and nasopharynx clear.  NECK:  Supple, no jugular venous distention. No thyroid enlargement, no tenderness.  LUNGS: Normal breath sounds bilaterally, no wheezing, rales,rhonchi or crepitation. No use of accessory muscles of respiration.  CARDIOVASCULAR: S1, S2 normal. No murmurs, rubs, or gallops.  ABDOMEN: Soft, nontender, nondistended. Bowel sounds present. No organomegaly or mass.  EXTREMITIES: No pedal edema, cyanosis, or clubbing.  Great toe gangrene of left foot with surrounding erythema, tenderness and swelling.  NEUROLOGIC: Cranial nerves II through XII are intact. Muscle strength 5/5 in all extremities. Sensation intact. Gait not checked.  PSYCHIATRIC: The patient is alert and oriented x 3.  But demented. SKIN: No obvious rash, lesion, or ulcer.   LABORATORY PANEL:   CBC No results for  input(s): WBC, HGB, HCT, PLT in the last 168 hours. ------------------------------------------------------------------------------------------------------------------  Chemistries  No results for input(s): NA, K, CL, CO2, GLUCOSE, BUN, CREATININE, CALCIUM, MG, AST, ALT, ALKPHOS, BILITOT in the last 168 hours.  Invalid input(s): GFRCGP ------------------------------------------------------------------------------------------------------------------  Cardiac Enzymes No results for input(s): TROPONINI in the last 168 hours. ------------------------------------------------------------------------------------------------------------------  RADIOLOGY:  No results found.    IMPRESSION AND PLAN:   Big toe gangrene of left foot. The patient is admitted to medical floor. Start vancomycin and Zosyn, follow-up CBC and Dr. Ether GriffinsFowler follow-up for surgery.  Pain control.  Hold Plavix for surgery.  Hypertension.  Continue Lopressor. CKD stage III.  Follow-up BMP. Hyperlipidemia.  Continue Lipitor. Dementia.  Aspiration the fall precaution.  All the records are reviewed and case discussed with ED provider. Management plans discussed with the patient, her 2 sons and they are in agreement.  CODE STATUS: DNR  TOTAL TIME TAKING CARE  OF THIS PATIENT: 42 minutes.    Shaune Pollack M.D on 08/08/2018 at 4:45 PM  Between 7am to 6pm - Pager - [phone]  After 6pm go to [email or web address] - Social research officer, government  [facility] [place]  Office  [phone]  CC: Primary care physician; Karie Schwalbe, MD   Note: This dictation was prepared with Dragon dictation along with smaller phrase technology. Any transcriptional errors that result from this process are unin

## 2018-08-09 ENCOUNTER — Inpatient Hospital Stay: Payer: Medicare Other | Admitting: Anesthesiology

## 2018-08-09 ENCOUNTER — Encounter: Payer: Self-pay | Admitting: Podiatry

## 2018-08-09 ENCOUNTER — Encounter
Admission: RE | Disposition: A | Discharge status: Skilled Nursing Facility | Payer: Self-pay | Source: Ambulatory Visit | Attending: Internal Medicine

## 2018-08-09 HISTORY — PX: AMPUTATION TOE: SHX6595

## 2018-08-09 LAB — CBC
HCT: 37.8 % (ref 36.0–46.0)
Hemoglobin: 11.7 g/dL — ABNORMAL LOW (ref 12.0–15.0)
MCH: 27.1 pg (ref 26.0–34.0)
MCHC: 31 g/dL (ref 30.0–36.0)
MCV: 87.5 fL (ref 80.0–100.0)
NRBC: 0 % (ref 0.0–0.2)
Platelets: 271 10*3/uL (ref 150–400)
RBC: 4.32 MIL/uL (ref 3.87–5.11)
RDW: 12.3 % (ref 11.5–15.5)
WBC: 7 10*3/uL (ref 4.0–10.5)

## 2018-08-09 LAB — BASIC METABOLIC PANEL
ANION GAP: 8 (ref 5–15)
BUN: 28 mg/dL — ABNORMAL HIGH (ref 8–23)
CO2: 25 mmol/L (ref 22–32)
Calcium: 9.1 mg/dL (ref 8.9–10.3)
Chloride: 105 mmol/L (ref 98–111)
Creatinine, Ser: 1.1 mg/dL — ABNORMAL HIGH (ref 0.44–1.00)
GFR calc Af Amer: 48 mL/min — ABNORMAL LOW (ref >60)
GFR calc non Af Amer: 42 mL/min — ABNORMAL LOW (ref >60)
Glucose, Bld: 105 mg/dL — ABNORMAL HIGH (ref 70–99)
Potassium: 3.8 mmol/L (ref 3.5–5.1)
Sodium: 138 mmol/L (ref 135–145)

## 2018-08-09 SURGERY — AMPUTATION, TOE
Anesthesia: General | Site: Foot | Laterality: Left

## 2018-08-09 MED ORDER — BUPIVACAINE HCL (PF) 0.25 % IJ SOLN
INTRAMUSCULAR | Status: DC | PRN
  Administered 2018-08-09: 10 mL

## 2018-08-09 MED ORDER — FENTANYL CITRATE (PF) 100 MCG/2ML IJ SOLN: 25.0000 ug | INTRAMUSCULAR | Status: DC | PRN

## 2018-08-09 MED ORDER — PROPOFOL 10 MG/ML IV BOLUS
INTRAVENOUS | Status: AC
  Filled 2018-08-09: qty 20

## 2018-08-09 MED ORDER — PROPOFOL 500 MG/50ML IV EMUL
INTRAVENOUS | Status: DC | PRN
  Administered 2018-08-09: 50 ug/kg/min via INTRAVENOUS

## 2018-08-09 MED ORDER — EPHEDRINE SULFATE 50 MG/ML IJ SOLN
INTRAMUSCULAR | Status: DC | PRN
  Administered 2018-08-09: 10 mg via INTRAVENOUS

## 2018-08-09 MED ORDER — PROPOFOL 10 MG/ML IV BOLUS
INTRAVENOUS | Status: DC | PRN
  Administered 2018-08-09: 10 mg via INTRAVENOUS
  Administered 2018-08-09: 20 mg via INTRAVENOUS

## 2018-08-09 MED ORDER — FENTANYL CITRATE (PF) 100 MCG/2ML IJ SOLN
INTRAMUSCULAR | Status: AC
  Filled 2018-08-09: qty 2

## 2018-08-09 MED ORDER — BUPIVACAINE LIPOSOME 1.3 % IJ SUSP
INTRAMUSCULAR | Status: AC
  Filled 2018-08-09: qty 20

## 2018-08-09 MED ORDER — BUPIVACAINE LIPOSOME 1.3 % IJ SUSP
INTRAMUSCULAR | Status: DC | PRN
  Administered 2018-08-09: 10 mL

## 2018-08-09 MED ORDER — ONDANSETRON HCL 4 MG/2ML IJ SOLN: 4.0000 mg | Freq: Once | INTRAMUSCULAR | Status: DC | PRN

## 2018-08-09 SURGICAL SUPPLY — 41 items
BANDAGE ELASTIC 4 LF NS (GAUZE/BANDAGES/DRESSINGS) ×2 IMPLANT
BLADE OSC/SAGITTAL MD 5.5X18 (BLADE) ×2 IMPLANT
BLADE SURG MINI STRL (BLADE) ×2 IMPLANT
BNDG CONFORM 2 STRL LF (GAUZE/BANDAGES/DRESSINGS) ×2 IMPLANT
BNDG CONFORM 3 STRL LF (GAUZE/BANDAGES/DRESSINGS) ×4 IMPLANT
BNDG ESMARK 4X12 TAN STRL LF (GAUZE/BANDAGES/DRESSINGS) ×2 IMPLANT
BNDG GAUZE 4.5X4.1 6PLY STRL (MISCELLANEOUS) ×2 IMPLANT
CANISTER SUCT 1200ML W/VALVE (MISCELLANEOUS) ×2 IMPLANT
COVER WAND RF STERILE (DRAPES) ×2 IMPLANT
DRAPE FLUOR MINI C-ARM 54X84 (DRAPES) ×2 IMPLANT
DRAPE XRAY CASSETTE 23X24 (DRAPES) ×2 IMPLANT
DURAPREP 26ML APPLICATOR (WOUND CARE) ×2 IMPLANT
ELECT REM PT RETURN 9FT ADLT (ELECTROSURGICAL) ×2
ELECTRODE REM PT RTRN 9FT ADLT (ELECTROSURGICAL) ×1 IMPLANT
GAUZE PACKING IODOFORM 1/2 (PACKING) ×2 IMPLANT
GAUZE PETRO XEROFOAM 1X8 (MISCELLANEOUS) ×2 IMPLANT
GAUZE SPONGE 4X4 12PLY STRL (GAUZE/BANDAGES/DRESSINGS) ×2 IMPLANT
GLOVE BIO SURGEON STRL SZ7.5 (GLOVE) ×2 IMPLANT
GLOVE INDICATOR 8.0 STRL GRN (GLOVE) ×2 IMPLANT
GOWN STRL REUS W/ TWL LRG LVL3 (GOWN DISPOSABLE) ×2 IMPLANT
GOWN STRL REUS W/TWL LRG LVL3 (GOWN DISPOSABLE) ×2
KIT TURNOVER KIT A (KITS) ×2 IMPLANT
LABEL OR SOLS (LABEL) ×2 IMPLANT
NEEDLE FILTER BLUNT 18X 1/2SAF (NEEDLE) ×1
NEEDLE FILTER BLUNT 18X1 1/2 (NEEDLE) ×1 IMPLANT
NEEDLE HYPO 25X1 1.5 SAFETY (NEEDLE) ×2 IMPLANT
NS IRRIG 500ML POUR BTL (IV SOLUTION) ×2 IMPLANT
PACK EXTREMITY ARMC (MISCELLANEOUS) ×2 IMPLANT
PAD ABD DERMACEA PRESS 5X9 (GAUZE/BANDAGES/DRESSINGS) ×4 IMPLANT
PULSAVAC PLUS IRRIG FAN TIP (DISPOSABLE) ×2
SHIELD FULL FACE ANTIFOG 7M (MISCELLANEOUS) ×2 IMPLANT
SOL .9 NS 3000ML IRR  AL (IV SOLUTION) ×1
SOL .9 NS 3000ML IRR UROMATIC (IV SOLUTION) ×1 IMPLANT
STOCKINETTE M/LG 89821 (MISCELLANEOUS) ×2 IMPLANT
STRAP SAFETY 5IN WIDE (MISCELLANEOUS) ×2 IMPLANT
SUT ETHILON 3-0 FS-10 30 BLK (SUTURE) ×2
SUT ETHILON 5-0 FS-2 18 BLK (SUTURE) ×2 IMPLANT
SUT VIC AB 4-0 FS2 27 (SUTURE) ×2 IMPLANT
SUTURE EHLN 3-0 FS-10 30 BLK (SUTURE) ×1 IMPLANT
SYR 10ML LL (SYRINGE) ×6 IMPLANT
TIP FAN IRRIG PULSAVAC PLUS (DISPOSABLE) ×1 IMPLANT

## 2018-08-09 NOTE — Progress Notes (Signed)
[facility] - [place] at [facility]   PATIENT NAME: Melissa Cooper    MR#:  [id number]  DATE OF BIRTH:  02/21/1918  SUBJECTIVE:   Patient has some mild confusion this morning    REVIEW OF SYSTEMS:    Review of Systems  Constitutional: Negative for fever, chills weight loss HENT: Negative for ear pain, nosebleeds, congestion, facial swelling, rhinorrhea, neck pain, neck stiffness and ear discharge.   Respiratory: Negative for cough, shortness of breath, wheezing  Cardiovascular: Negative for chest pain, palpitations and leg swelling.  Gastrointestinal: Negative for heartburn, abdominal pain, vomiting, diarrhea or consitpation Genitourinary: Negative for dysuria, urgency, frequency, hematuria Musculoskeletal: Negative for back pain or joint pain Neurological: Negative for dizziness, seizures, syncope, focal weakness,  numbness and headaches.  Hematological: Does not bruise/bleed easily.  Psychiatric/Behavioral: Negative for hallucinations, ++confusion, nodysphoric mood  SKIN gangren big toe  Tolerating Diet: npo      DRUG ALLERGIES:   Allergies  Allergen Reactions  . Actonel [Risedronate Sodium] Other (See Comments)    Unknown reaction  . Ceftin [Cefuroxime Axetil] Other (See Comments)    Unknown reaction  . Evista [Raloxifene] Other (See Comments)    Unknown reaction  . Miacalcin [Calcitonin ([place])] Other (See Comments)    Unknown reaction  . Zyrtec [Cetirizine] Other (See Comments)    Unknown reaction    VITALS:  Blood pressure (!) 156/57, pulse 61, temperature 98.4 F (36.9 C), temperature source Oral, resp. rate 17, height 5' (1.524 m), weight 53.3 kg, SpO2 100 %.  PHYSICAL EXAMINATION:  Constitutional: Appears well-developed and well-nourished. No distress. HENT: Normocephalic. Marland Kitchen. Oropharynx is clear and moist.  Eyes: Conjunctivae and EOM are normal. PERRLA, no scleral icterus.  Neck: Normal ROM. Neck supple. No JVD. No tracheal  deviation. CVS: RRR, S1/S2 +, no murmurs, no gallops, no carotid bruit.  Pulmonary: Effort and breath sounds normal, no stridor, rhonchi, wheezes, rales.  Abdominal: Soft. BS +,  no distension, tenderness, rebound or guarding.  Musculoskeletal: Normal range of motion. No edema and no tenderness.  Neuro: Alert. CN 2-12 grossly intact. No focal deficits. Skin: Skin is warm and dry. Big toe gangrene with some mild surrounding erythema   psychiatric: Pleasantly confused   LABORATORY PANEL:   CBC Recent Labs  Lab 08/09/18 0401  WBC 7.0  HGB 11.7*  HCT 37.8  PLT 271   ------------------------------------------------------------------------------------------------------------------  Chemistries  Recent Labs  Lab 08/09/18 0401  NA 138  K 3.8  CL 105  CO2 25  GLUCOSE 105*  BUN 28*  CREATININE 1.10*  CALCIUM 9.1   ------------------------------------------------------------------------------------------------------------------  Cardiac Enzymes No results for input(s): TROPONINI in the last 168 hours. ------------------------------------------------------------------------------------------------------------------  RADIOLOGY:  No results found.   ASSESSMENT AND PLAN:   84 year old female with history of chronic kidney disease stage III and essential hypertension with dementia who presents to the hospital as a direct admission for big toe gangrene of the left foot.  1 Left foot big toe gangrene: Patient is scheduled to go to the operating room today for digital amputation. Continue Zosyn and vanc  2.  Essential hypertension: Continue metoprolol  3.  Hyperlipidemia: Continue statin    Management plans discussed with the patient and she is in agreement.  CODE STATUS: DNR  TOTAL TIME TAKING CARE OF THIS PATIENT: 28 minutes.     POSSIBLE D/C Kateri PlummerMorrow, DEPENDING ON CLINICAL CONDITION.   Pandora Mccrackin M.D on 08/09/2018 at 12:18 PM  Between 7am to 6pm - Pager -  [phone] After  6pm go to [email or web address] - password [email or web address]  [facility] [place] [facility]  Office  [phone]  CC: Primary care physician; Karie Schwalbe, MD  Note: This dictation was prepared with Dragon dictation along with smaller phrase technology. Any transcriptional errors that result from this process are unintentional.

## 2018-08-09 NOTE — Transfer of Care (Signed)
Immediate Anesthesia Transfer of Care Note  Patient: Melissa Cooper  Procedure(s) Performed: AMPUTATION TOE MPJ LEFT (Left Foot)  Patient Location: PACU  Anesthesia Type:MAC  Level of Consciousness: awake  Airway & Oxygen Therapy: Patient Spontanous Breathing and Patient connected to face mask oxygen  Post-op Assessment: Report given to RN and Post -op Vital signs reviewed and stable  Post vital signs: Reviewed and stable  Last Vitals:  Vitals Value Taken Time  BP 137/58 08/09/2018  3:20 PM  Temp    Pulse 69 08/09/2018  3:27 PM  Resp 17 08/09/2018  3:27 PM  SpO2 99 % 08/09/2018  3:27 PM  Vitals shown include unvalidated device data.  Last Pain:  Vitals:   08/09/18 1343  TempSrc: Temporal  PainSc:          Complications: No apparent anesthesia complications

## 2018-08-09 NOTE — Op Note (Signed)
Operative note   Surgeon:Catalea Labrecque Armed forces logistics/support/administrative officerowler    Assistant: None    Preop diagnosis: Left great toe gangrene    Postop diagnosis: Same    Procedure: Amputation metatarsophalangeal joint left great toe    EBL: Minimal    Anesthesia:local and IV sedation.  Local consisted of a one-to-one mixture of 0.25% bupivacaine and Exparel long-acting anesthetic.  A total of 20 cc was used    Hemostasis: None    Specimen: Gangrenous left great toe    Complications: None    Operative indications:Melissa Cooper is an 84 y.o. that presents today for surgical intervention.  The risks/benefits/alternatives/complications have been discussed and consent has been given.    Procedure:  Patient was brought into the OR and placed on the operating table in thesupine position. After anesthesia was obtained theleft lower extremity was prepped and draped in usual sterile fashion.  Attention was directed to the left great toe where gangrenous changes were noted at the distal aspect of the great toe to the level of the mid shaft of the proximal phalanx.  Dorsal and plantar skin flaps were created.  Full-thickness incision was taken back to the metatarsal phalangeal joint.  The toe was then disarticulated and removed from the surgical field in toto.  This was sent for pathological examination.  No signs of obvious infection at this level were noted.  The wound was flushed with copious amounts of irrigation.  Minimal bleeding was noted throughout the procedure.  Closure was performed with a 3-0 nylon.  A bulky sterile dressing was applied to the left foot.    Patient tolerated the procedure and anesthesia well.  Was transported from the OR to the PACU with all vital signs stable and vascular status intact. To be discharged per routine protocol.  Will follow up in approximately 1 week in the outpatient clinic.

## 2018-08-09 NOTE — Anesthesia Procedure Notes (Signed)
Procedure Name: MAC Date/Time: 08/09/2018 2:23 PM Performed by: Allean Found, CRNA Pre-anesthesia Checklist: Patient identified, Emergency Drugs available, Suction available, Patient being monitored and Timeout performed Oxygen Delivery Method: Nasal cannula Placement Confirmation: positive ETCO2

## 2018-08-09 NOTE — Anesthesia Postprocedure Evaluation (Signed)
Anesthesia Post Note  Patient: Melissa Cooper  Procedure(s) Performed: AMPUTATION TOE MPJ LEFT (Left Foot)  Patient location during evaluation: PACU Anesthesia Type: General Level of consciousness: awake and alert Pain management: pain level controlled Vital Signs Assessment: post-procedure vital signs reviewed and stable Respiratory status: spontaneous breathing and respiratory function stable Cardiovascular status: stable Anesthetic complications: no     Last Vitals:  Vitals:   08/09/18 1343 08/09/18 1520  BP: 104/70 (!) 137/58  Pulse: 73 77  Resp: 17 11  Temp: 36.8 C   SpO2: 98% 99%    Last Pain:  Vitals:   08/09/18 1343  TempSrc: Temporal  PainSc:                  Mical Kicklighter K

## 2018-08-09 NOTE — Anesthesia Preprocedure Evaluation (Signed)
Anesthesia Evaluation  Patient identified by MRN, date of birth, ID band Patient awake    Reviewed: Allergy & Precautions, NPO status , Patient's Chart, lab work & pertinent test results  History of Anesthesia Complications Negative for: history of anesthetic complications  Airway Mallampati: III       Dental  (+) Edentulous Upper, Edentulous Lower   Pulmonary sleep apnea , neg COPD,    breath sounds clear to auscultation- rhonchi (-) wheezing      Cardiovascular hypertension, (-) CAD, (-) Past MI, (-) Cardiac Stents and (-) CABG  Rhythm:Regular Rate:Normal - Systolic murmurs and - Diastolic murmurs    Neuro/Psych PSYCHIATRIC DISORDERS Dementia    GI/Hepatic negative GI ROS, Neg liver ROS,   Endo/Other  neg diabetes  Renal/GU Renal InsufficiencyRenal disease     Musculoskeletal  (+) Arthritis ,   Abdominal (+) - obese,   Peds  Hematology  (+) anemia ,   Anesthesia Other Findings Past Medical History: No date: CKD (chronic kidney disease)     Comment:  stage 3 No date: Dementia (HCC) No date: HTN (hypertension) No date: Hypercholesteremia No date: OSA (obstructive sleep apnea) No date: Osteoarthritis No date: Osteoarthritis No date: PA (pernicious anemia)   Reproductive/Obstetrics                             Anesthesia Physical Anesthesia Plan  ASA: III  Anesthesia Plan: General   Post-op Pain Management:    Induction: Intravenous  PONV Risk Score and Plan: 2 and Propofol infusion  Airway Management Planned: Natural Airway  Additional Equipment:   Intra-op Plan:   Post-operative Plan:   Informed Consent: I have reviewed the patients History and Physical, chart, labs and discussed the procedure including the risks, benefits and alternatives for the proposed anesthesia with the patient or authorized representative who has indicated his/her understanding and acceptance.    Dental advisory given and Consent reviewed with POA  Plan Discussed with: CRNA and Anesthesiologist  Anesthesia Plan Comments: (Pt has DNR order, we discussed plan for suspension of DNR order during periop period due to possible need for respiratory support, BP support and potential reactions to medications during the procedure. )        Anesthesia Quick Evaluation

## 2018-08-09 NOTE — Anesthesia Post-op Follow-up Note (Signed)
Anesthesia QCDR form completed.        

## 2018-08-10 LAB — CBC
HCT: 32 % — ABNORMAL LOW (ref 36.0–46.0)
Hemoglobin: 10.1 g/dL — ABNORMAL LOW (ref 12.0–15.0)
MCH: 27.2 pg (ref 26.0–34.0)
MCHC: 31.6 g/dL (ref 30.0–36.0)
MCV: 86.3 fL (ref 80.0–100.0)
Platelets: 215 10*3/uL (ref 150–400)
RBC: 3.71 MIL/uL — ABNORMAL LOW (ref 3.87–5.11)
RDW: 12.5 % (ref 11.5–15.5)
WBC: 5.9 10*3/uL (ref 4.0–10.5)
nRBC: 0 % (ref 0.0–0.2)

## 2018-08-10 LAB — BASIC METABOLIC PANEL
Anion gap: 8 (ref 5–15)
BUN: 30 mg/dL — ABNORMAL HIGH (ref 8–23)
CO2: 25 mmol/L (ref 22–32)
Calcium: 8.5 mg/dL — ABNORMAL LOW (ref 8.9–10.3)
Chloride: 107 mmol/L (ref 98–111)
Creatinine, Ser: 1.38 mg/dL — ABNORMAL HIGH (ref 0.44–1.00)
GFR calc Af Amer: 37 mL/min — ABNORMAL LOW (ref >60)
GFR calc non Af Amer: 32 mL/min — ABNORMAL LOW (ref >60)
Glucose, Bld: 135 mg/dL — ABNORMAL HIGH (ref 70–99)
Potassium: 3.7 mmol/L (ref 3.5–5.1)
Sodium: 140 mmol/L (ref 135–145)

## 2018-08-10 MED ORDER — HYDRALAZINE HCL 20 MG/ML IJ SOLN: 10.0000 mg | Freq: Four times a day (QID) | INTRAMUSCULAR | Status: DC | PRN

## 2018-08-10 NOTE — Clinical Social Work Note (Addendum)
Clinical Social Work Assessment  Patient Details  Name: Melissa Cooper MRN: [id number] Date of Birth: 12/24/1917  Date of referral:  08/10/18               Reason for consult:  Discharge Planning                Permission sought to share information with:  Facility Industrial/product designerContact Representative Permission granted to share information::  Yes, Verbal Permission Granted  Name::        Agency::     Relationship::     Contact Information:     Housing/Transportation Living arrangements for the past 2 months:  Single Family Home Source of Information:  Adult Children Patient Interpreter Needed:  None Criminal Activity/Legal Involvement Pertinent to Current Situation/Hospitalization:  No - Comment as needed Significant Relationships:  Adult Children Lives with:  Facility Resident Do you feel safe going back to the place where you live?  Yes Need for family participation in patient care:  Yes (Comment)  Care giving concerns:  Patient is a long term resident at [facility].   Social Worker assessment / plan:  CSW spoke with patient's son this afternoon regarding discharge disposition at [facility]. Son initially thought that patient was to discharge today however Dr. Juliene PinaMody informed CSW that patient will discharge tomorrow. CSW has notified patient's son: Ines BloomerCarl Cooper: [phone] and also [facility] at [facility].   Employment status:    Insurance information:  Medicare PT Recommendations:    Information / Referral to community resources:     Patient/Family's Response to care:  Patient's son expressed appreciation for CSW assistance.  Patient/Family's Understanding of and Emotional Response to Diagnosis, Current Treatment, and Prognosis:  Patient's son is happy to know that his mother is doing well post surgery.  Emotional Assessment Appearance:  Appears stated age Attitude/Demeanor/Rapport:  (patient confused and son was present at bedside) Affect (typically observed):    Orientation:  Oriented  to Self Alcohol / Substance use:  Not Applicable Psych involvement (Current and /or in the community):  No (Comment)  Discharge Needs  Concerns to be addressed:  Care Coordination Readmission within the last 30 days:  No Current discharge risk:  None Barriers to Discharge:  No Barriers Identified   York SpanielMonica Serah Nicoletti, LCSW 08/10/2018, 2:57 PM

## 2018-08-10 NOTE — Progress Notes (Signed)
Daily Progress Note   Subjective  - 1 Day Post-Op  F/u left great toe amputation  Objective Vitals:   08/09/18 1811 08/09/18 2101 08/10/18 0638 08/10/18 0704  BP: 138/64 (!) 144/70 (!) 179/66 (!) 178/69  Pulse: 71 66 70 70  Resp: 14 18 18    Temp: 97.6 F (36.4 C) 97.7 F (36.5 C) 97.8 F (36.6 C)   TempSrc: Oral Oral Oral   SpO2: 97% 97% 100%   Weight:      Height:        Physical Exam: Incision well coapted.  No dehiscence.  No s/s infection  Laboratory CBC    Component Value Date/Time   WBC 5.9 08/10/2018 0444   HGB 10.1 (L) 08/10/2018 0444   HGB 9.3 (L) 07/03/2014 0455   HCT 32.0 (L) 08/10/2018 0444   HCT 28.6 (L) 07/03/2014 0455   PLT 215 08/10/2018 0444   PLT 156 07/03/2014 0455    BMET    Component Value Date/Time   NA 140 08/10/2018 0444   NA 145 07/03/2014 0455   K 3.7 08/10/2018 0444   K 3.7 07/03/2014 0455   CL 107 08/10/2018 0444   CL 110 (H) 07/03/2014 0455   CO2 25 08/10/2018 0444   CO2 26 07/03/2014 0455   GLUCOSE 135 (H) 08/10/2018 0444   GLUCOSE 96 07/03/2014 0455   BUN 30 (H) 08/10/2018 0444   BUN 16 07/03/2014 0455   CREATININE 1.38 (H) 08/10/2018 0444   CREATININE 1.35 (H) 07/03/2014 0455   CALCIUM 8.5 (L) 08/10/2018 0444   CALCIUM 8.2 (L) 07/03/2014 0455   GFRNONAA 32 (L) 08/10/2018 0444   GFRNONAA 39 (L) 07/03/2014 0455   GFRNONAA 42 (L) 04/28/2014 0534   GFRAA 37 (L) 08/10/2018 0444   GFRAA 47 (L) 07/03/2014 0455   GFRAA 49 (L) 04/28/2014 0534    Assessment/Planning: Gangrene s/p amputation   OK to d/c from my standpoint.  Dressing changes:  Change dressing 2-3 x's per week with dry bulky sterile dressing.  NWB to left foot.  F/u with me in 2-3 weeks.  Not infected, no antibiotics needed.    Melissa Cooper, Melissa Theys A  08/10/2018, 12:51 PM

## 2018-08-10 NOTE — Progress Notes (Signed)
[employer] [facility] [facility] [place] at [facility]   PATIENT NAME: Melissa Cooper    MR#:  [id number]  DATE OF BIRTH:  12/14/1917  SUBJECTIVE:   Patient is postoperative day #1.  She was having some pain this morning.  Pain seems to be better controlled today.   REVIEW OF SYSTEMS:    Review of Systems  Constitutional: Negative for fever, chills weight loss HENT: Negative for ear pain, nosebleeds, congestion, facial swelling, rhinorrhea, neck pain, neck stiffness and ear discharge.   Respiratory: Negative for cough, shortness of breath, wheezing  Cardiovascular: Negative for chest pain, palpitations and leg swelling.  Gastrointestinal: Negative for heartburn, abdominal pain, vomiting, diarrhea or consitpation Genitourinary: Negative for dysuria, urgency, frequency, hematuria Musculoskeletal: Negative for back pain or joint pain Neurological: Negative for dizziness, seizures, syncope, focal weakness,  numbness and headaches.  Hematological: Does not bruise/bleed easily.  Psychiatric/Behavioral: Negative for hallucinations, ++confusion, nodysphoric mood    Tolerating Diet: yes     DRUG ALLERGIES:   Allergies  Allergen Reactions  . Actonel [Risedronate Sodium] Other (See Comments)    Unknown reaction  . Ceftin [Cefuroxime Axetil] Other (See Comments)    Unknown reaction  . Evista [Raloxifene] Other (See Comments)    Unknown reaction  . Miacalcin [Calcitonin (Salmon)] Other (See Comments)    Unknown reaction  . Zyrtec [Cetirizine] Other (See Comments)    Unknown reaction    VITALS:  Blood pressure (!) 178/69, pulse 70, temperature 97.8 F (36.6 C), temperature source Oral, resp. rate 18, height 5' (1.524 m), weight 53.3 kg, SpO2 100 %.  PHYSICAL EXAMINATION:  Constitutional: Appears well-developed and well-nourished. No distress. HENT: Normocephalic. Marland Kitchen. Oropharynx is clear and moist.  Eyes: Conjunctivae and EOM are normal. PERRLA, no scleral icterus.  Neck: Normal  ROM. Neck supple. No JVD. No tracheal deviation. CVS: RRR, S1/S2 +, no murmurs, no gallops, no carotid bruit.  Pulmonary: Effort and breath sounds normal, no stridor, rhonchi, wheezes, rales.  Abdominal: Soft. BS +,  no distension, tenderness, rebound or guarding.  Musculoskeletal: Normal range of motion. No edema and no tenderness.  Neuro: Alert. CN 2-12 grossly intact. No focal deficits. Skin: Skin is warm and dry.  Large Ace bandage placed psychiatric: Pleasantly confused   LABORATORY PANEL:   CBC Recent Labs  Lab 08/10/18 0444  WBC 5.9  HGB 10.1*  HCT 32.0*  PLT 215   ------------------------------------------------------------------------------------------------------------------  Chemistries  Recent Labs  Lab 08/10/18 0444  NA 140  K 3.7  CL 107  CO2 25  GLUCOSE 135*  BUN 30*  CREATININE 1.38*  CALCIUM 8.5*   ------------------------------------------------------------------------------------------------------------------  Cardiac Enzymes No results for input(s): TROPONINI in the last 168 hours. ------------------------------------------------------------------------------------------------------------------  RADIOLOGY:  No results found.   ASSESSMENT AND PLAN:   84 year old female with history of chronic kidney disease stage III and essential hypertension with dementia who presents to the hospital as a direct admission for big toe gangrene of the left foot.  1 Left foot big toe gangrene:  Patient is postoperative day #1 amputation metatarsal phalangeal joint left great toe. Management as per podiatry Continue vancomycin and Zosyn for now but probably can change this to oral Augmentin at discharge. Continue pain medications  2.  Essential hypertension: Continue metoprolol  3.  Hyperlipidemia: Continue statin 4.  Acute kidney injury: Monitor creatinine closely Continue low-dose IV fluids  Patient is from long term acute care.  Management plans  discussed with the patient and she is in agreement.  CODE STATUS: DNR  TOTAL TIME TAKING CARE OF THIS PATIENT: 24 minutes.     [facility]OSSIBLE [facility]/C [facility], DEPENDING ON CLINICAL CONDITION.   Verl Whitmore M.D on 08/10/2018 at 10:17 AM  Between 7am to 6pm - Pager - [phone] After 6pm go to [email or web address] - password [email or web address]  [facility] [place] [facility]  Office  [phone]  CC: Primary care physician; Karie Schwalbe, MD  Note: This dictation was prepared with Dragon dictation along with smaller phrase technology. Any transcriptional errors that result from this process are unintentional.

## 2018-08-11 LAB — SURGICAL PATHOLOGY

## 2018-08-11 NOTE — Care Management Important Message (Signed)
Important Message  Patient Details  Name: Melissa Cooper MRN: [id number] Date of Birth: 09/04/1917   Medicare Important Message Given:  Yes    Chapman FitchBOWEN, Desi Carby T, RN 08/11/2018, 10:47 AM

## 2018-08-11 NOTE — Discharge Summary (Signed)
[employer] - [place] at [facility]   PATIENT NAME: Melissa Cooper    MR#:  [id number]  DATE OF BIRTH:  1918/07/18  DATE OF ADMISSION:  08/08/2018 ADMITTING PHYSICIAN: Shaune Pollack, MD  DATE OF DISCHARGE: 08/11/2018  PRIMARY CARE PHYSICIAN: Karie Schwalbe, MD    ADMISSION DIAGNOSIS:  Gangrene of left toe  DISCHARGE DIAGNOSIS:  Active Problems:   Gangrene of toe of left foot (HCC)   SECONDARY DIAGNOSIS:   Past Medical History:  Diagnosis Date  . CKD (chronic kidney disease)    stage 3  . Dementia (HCC)   . HTN (hypertension)   . Hypercholesteremia   . OSA (obstructive sleep apnea)   . Osteoarthritis   . Osteoarthritis   . PA (pernicious anemia)     HOSPITAL COURSE:   84 year old female with history of chronic kidney disease stage III and essential hypertension with dementia who presents to the hospital as a direct admission for big toe gangrene of the left foot.  1 Left foot big toe gangrene:  Patient is postoperative day #2 amputation metatarsal phalangeal joint left great toe. She will require dressings 2-3 times a week with a dry bulky sterile dressing.  Now weightbearing to left foot.  She will follow-up with podiatry in 2 weeks.  She does not need antibiotics upon discharge.   2.  Essential hypertension: Continue metoprolol Blood pressure should be monitored as outpatient.  Although her blood pressure was slightly elevated during hospital stay we did not make any adjustments due to her age.   3.  Hyperlipidemia: Continue statin 4.  Acute kidney injury on chronic kidney disease stage III: Patient will need a repeat BMP in 1 to 2 days.   DISCHARGE CONDITIONS AND DIET:   For discharge regular diet  CONSULTS OBTAINED:    DRUG ALLERGIES:   Allergies  Allergen Reactions  . Actonel [Risedronate Sodium] Other (See Comments)    Unknown reaction  . Ceftin [Cefuroxime Axetil] Other (See Comments)    Unknown reaction  . Evista [Raloxifene]  Other (See Comments)    Unknown reaction  . Miacalcin [Calcitonin (Salmon)] Other (See Comments)    Unknown reaction  . Zyrtec [Cetirizine] Other (See Comments)    Unknown reaction    DISCHARGE MEDICATIONS:   Allergies as of 08/11/2018      Reactions   Actonel [risedronate Sodium] Other (See Comments)   Unknown reaction   Ceftin [cefuroxime Axetil] Other (See Comments)   Unknown reaction   Evista [raloxifene] Other (See Comments)   Unknown reaction   Miacalcin [calcitonin (salmon)] Other (See Comments)   Unknown reaction   Zyrtec [cetirizine] Other (See Comments)   Unknown reaction      Medication List    STOP taking these medications   doxycycline 100 MG tablet Commonly known as:  VIBRA-TABS     TAKE these medications   ACETAMINOPHEN 8 HOUR 650 MG CR tablet Generic drug:  acetaminophen Take 650 mg by mouth 3 (three) times daily.   ALIGN 4 MG Caps Take 4 mg by mouth daily. (0800)   aspirin EC 81 MG tablet Take 81 mg by mouth daily at 8 pm. (2000)   atorvastatin 10 MG tablet Commonly known as:  LIPITOR Take 1 tablet (10 mg total) by mouth daily. What changed:    when to take this  additional instructions   benzonatate 100 MG capsule Commonly known as:  TESSALON Take 1 capsule (100 mg total) by mouth 3 (three) times daily as needed  for cough.   clopidogrel 75 MG tablet Commonly known as:  PLAVIX Take 1 tablet (75 mg total) by mouth daily. What changed:    when to take this  additional instructions   Melatonin 3 MG Caps Take 3 mg by mouth at bedtime. (2000)   metoprolol succinate 50 MG 24 hr tablet Commonly known as:  TOPROL-XL Take 50 mg by mouth daily. (0800)Take with or immediately following a meal.   NUTRITIONAL DRINK PO Take 120 mLs by mouth 2 (two) times daily between meals. MEDPASS   nystatin powder Generic drug:  nystatin Apply 2 g topically 2 (two) times daily as needed (rash (under breasts and groin area)).   polyethylene glycol  packet Commonly known as:  MIRALAX / GLYCOLAX Take 17 g by mouth daily as needed for mild constipation.   traMADol 50 MG tablet Commonly known as:  ULTRAM Take 50 mg by mouth every 8 (eight) hours as needed (pain).   vitamin B-12 500 MCG tablet Commonly known as:  CYANOCOBALAMIN Take 500 mcg by mouth daily. (0800)            Discharge Care Instructions  (From admission, onward)         Start     Ordered   08/11/18 0000  Discharge wound care:    Comments:  Change dressing 2-3 x's per week with dry bulky   08/11/18 0840            Today   CHIEF COMPLAINT:  No acute issues overnight   VITAL SIGNS:  Blood pressure (!) 155/72, pulse 67, temperature (!) 97.3 F (36.3 C), temperature source Oral, resp. rate 18, height 5' (1.524 m), weight 53.3 kg, SpO2 100 %.   REVIEW OF SYSTEMS:  Review of Systems  Unable to perform ROS: Dementia     PHYSICAL EXAMINATION:  GENERAL:  84 y.o.-year-old patient lying in the bed with no acute distress.  NECK:  Supple, no jugular venous distention. No thyroid enlargement, no tenderness.  LUNGS: Normal breath sounds bilaterally, no wheezing, rales,rhonchi  No use of accessory muscles of respiration.  CARDIOVASCULAR: S1, S2 normal. No murmurs, rubs, or gallops.  ABDOMEN: Soft, non-tender, non-distended. Bowel sounds present. No organomegaly or mass.  EXTREMITIES: No pedal edema, cyanosis, or clubbing.  PSYCHIATRIC: The patient is alert and oriented x name not place time.  SKIN: left foot dressed  DATA REVIEW:   CBC Recent Labs  Lab 08/10/18 0444  WBC 5.9  HGB 10.1*  HCT 32.0*  PLT 215    Chemistries  Recent Labs  Lab 08/10/18 0444  NA 140  K 3.7  CL 107  CO2 25  GLUCOSE 135*  BUN 30*  CREATININE 1.38*  CALCIUM 8.5*    Cardiac Enzymes No results for input(s): TROPONINI in the last 168 hours.  Microbiology Results  @MICRORSLT48 @  RADIOLOGY:  No results found.    Allergies as of 08/11/2018       Reactions   Actonel [risedronate Sodium] Other (See Comments)   Unknown reaction   Ceftin [cefuroxime Axetil] Other (See Comments)   Unknown reaction   Evista [raloxifene] Other (See Comments)   Unknown reaction   Miacalcin [calcitonin (salmon)] Other (See Comments)   Unknown reaction   Zyrtec [cetirizine] Other (See Comments)   Unknown reaction      Medication List    STOP taking these medications   doxycycline 100 MG tablet Commonly known as:  VIBRA-TABS     TAKE these medications   ACETAMINOPHEN 8  HOUR 650 MG CR tablet Generic drug:  acetaminophen Take 650 mg by mouth 3 (three) times daily.   ALIGN 4 MG Caps Take 4 mg by mouth daily. (0800)   aspirin EC 81 MG tablet Take 81 mg by mouth daily at 8 pm. (2000)   atorvastatin 10 MG tablet Commonly known as:  LIPITOR Take 1 tablet (10 mg total) by mouth daily. What changed:    when to take this  additional instructions   benzonatate 100 MG capsule Commonly known as:  TESSALON Take 1 capsule (100 mg total) by mouth 3 (three) times daily as needed for cough.   clopidogrel 75 MG tablet Commonly known as:  PLAVIX Take 1 tablet (75 mg total) by mouth daily. What changed:    when to take this  additional instructions   Melatonin 3 MG Caps Take 3 mg by mouth at bedtime. (2000)   metoprolol succinate 50 MG 24 hr tablet Commonly known as:  TOPROL-XL Take 50 mg by mouth daily. (0800)Take with or immediately following a meal.   NUTRITIONAL DRINK PO Take 120 mLs by mouth 2 (two) times daily between meals. MEDPASS   nystatin powder Generic drug:  nystatin Apply 2 g topically 2 (two) times daily as needed (rash (under breasts and groin area)).   polyethylene glycol packet Commonly known as:  MIRALAX / GLYCOLAX Take 17 g by mouth daily as needed for mild constipation.   traMADol 50 MG tablet Commonly known as:  ULTRAM Take 50 mg by mouth every 8 (eight) hours as needed (pain).   vitamin B-12 500 MCG  tablet Commonly known as:  CYANOCOBALAMIN Take 500 mcg by mouth daily. (0800)            Discharge Care Instructions  (From admission, onward)         Start     Ordered   08/11/18 0000  Discharge wound care:    Comments:  Change dressing 2-3 x's per week with dry bulky   08/11/18 0840              Stable for discharge   Patient should follow up with podiatry  CODE STATUS:     Code Status Orders  (From admission, onward)         Start     Ordered   08/08/18 1622  Do not attempt resuscitation (DNR)  Continuous    Question Answer Comment  In the event of cardiac or respiratory ARREST Do not call a "code blue"   In the event of cardiac or respiratory ARREST Do not perform Intubation, CPR, defibrillation or ACLS   In the event of cardiac or respiratory ARREST Use medication by any route, position, wound care, and other measures to relive pain and suffering. May use oxygen, suction and manual treatment of airway obstruction as needed for comfort.      08/08/18 1621        Code Status History    Date Active Date Inactive Code Status Order ID Comments User Context   09/18/2016 1500 09/20/2016 1936 DNR [id number]  Marguarite Arbour, MD Inpatient   06/12/2016 1202 06/16/2016 0123 DNR [id number]  Enedina Finner, MD Inpatient   06/12/2016 0945 06/12/2016 1201 Full Code [id number]  Enedina Finner, MD Inpatient   09/28/2015 1322 10/06/2015 1514 DNR [id number]  Enid Baas, MD Inpatient   08/29/2015 1835 09/05/2015 1835 DNR [id number]  Enid Baas, MD Inpatient    Advance Directive Documentation     Most Recent Value  Type of Advance Directive  Healthcare Power of Attorney, Living will, Out of facility DNR (pink MOST or yellow form)  Pre-existing out of facility DNR order (yellow form or pink MOST form)  Physician notified to receive inpatient order, Yellow form placed in chart (order not valid for inpatient use)  "MOST" Form in Place?  -      TOTAL TIME TAKING CARE OF  THIS PATIENT: 38 minutes.    Note: This dictation was prepared with Dragon dictation along with smaller phrase technology. Any transcriptional errors that result from this process are unintentional.  Libbi Towner M.D on 08/11/2018 at 8:40 AM  Between 7am to 6pm - Pager - [phone] After 6pm go to [email or web address] - Social research officer, government  [facility] [place] [facility]  Office  [phone]  CC: Primary care physician; Karie Schwalbe, MD

## 2018-08-11 NOTE — Clinical Social Work Note (Signed)
Patient discharging today. Discharge information sent to [name]. [facility] will transport. Patient's son is aware. York SpanielMonica Siani Utke MSW,LCSW [phone]

## 2018-08-15 ENCOUNTER — Emergency Department
Admission: EM | Admit: 2018-08-15 | Discharge: 2018-08-16 | Disposition: A | Discharge status: Skilled Nursing Facility | Payer: Medicare Other | Attending: Emergency Medicine | Admitting: Emergency Medicine

## 2018-08-15 ENCOUNTER — Other Ambulatory Visit: Payer: Self-pay

## 2018-08-15 DIAGNOSIS — N183 Chronic kidney disease, stage 3 (moderate): Secondary | ICD-10-CM | POA: Insufficient documentation

## 2018-08-15 DIAGNOSIS — L270 Generalized skin eruption due to drugs and medicaments taken internally: Secondary | ICD-10-CM | POA: Insufficient documentation

## 2018-08-15 DIAGNOSIS — I1310 Hypertensive heart and chronic kidney disease without heart failure, with stage 1 through stage 4 chronic kidney disease, or unspecified chronic kidney disease: Secondary | ICD-10-CM | POA: Insufficient documentation

## 2018-08-15 DIAGNOSIS — Z79899 Other long term (current) drug therapy: Secondary | ICD-10-CM | POA: Diagnosis not present

## 2018-08-15 DIAGNOSIS — F0390 Unspecified dementia without behavioral disturbance: Secondary | ICD-10-CM | POA: Diagnosis not present

## 2018-08-15 DIAGNOSIS — R21 Rash and other nonspecific skin eruption: Secondary | ICD-10-CM | POA: Diagnosis present

## 2018-08-15 DIAGNOSIS — Z7982 Long term (current) use of aspirin: Secondary | ICD-10-CM | POA: Insufficient documentation

## 2018-08-15 DIAGNOSIS — Z7902 Long term (current) use of antithrombotics/antiplatelets: Secondary | ICD-10-CM | POA: Insufficient documentation

## 2018-08-15 NOTE — ED Triage Notes (Signed)
[facility], staff noticed symptoms tonight. Given benadryl and solumedrol. Patient has not had any recent medication changes. Hx toe amputation, discharged back 08/11/18. Patient hx dementia, no obvious signs of distress with rash. Family requesting no hospital admission.

## 2018-08-15 NOTE — ED Provider Notes (Signed)
[facility] Emergency Department Provider Note   First MD Initiated Contact with Patient 08/15/18 2315     (approximate)  I have reviewed the triage vital signs and the nursing notes.  Level 5 caveat: History review of systems limited secondary to dementia unable to obtain meaningful history from the patient. HISTORY  Chief Complaint Rash   HPI Melissa Cooper is a 84 y.o. female with below list of chronic medical conditions including dementia presents to the emergency department from [facility] facility secondary to generalized rash.  EMS states that the patient was given Benadryl and Solu-Medrol.  Review of the patient's chart revealed that she had a great toe amputation performed on 08/11/2018 secondary to poor gangrene.  Chart review does not reveal any antibiotic therapy upon discharge.   Past Medical History:  Diagnosis Date  . CKD (chronic kidney disease)    stage 3  . Dementia (HCC)   . HTN (hypertension)   . Hypercholesteremia   . OSA (obstructive sleep apnea)   . Osteoarthritis   . Osteoarthritis   . PA (pernicious anemia)     Patient Active Problem List   Diagnosis Date Noted  . Gangrene of toe of left foot (HCC) 08/08/2018  . Hyperlipidemia 05/31/2018  . HTN (hypertension) 03/21/2018  . CKD (chronic kidney disease) 03/21/2018  . Dementia (HCC) 03/21/2018  . 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 Influenza A 09/18/2016  . Acute respiratory insufficiency 09/18/2016  . Abnormal CXR 09/18/2016  . AKI (acute kidney injury) (HCC) 09/18/2016  . Dehydration 09/18/2016  . Hip fracture (HCC) 06/12/2016  . Protein-calorie malnutrition, severe 09/30/2015  . Recurrent colitis due to Clostridium difficile 09/28/2015  . Clostridium difficile colitis 09/05/2015  . Pressure ulcer 08/30/2015  . Sepsis (HCC) 08/29/2015    Past Surgical History:  Procedure Laterality Date  . AMPUTATION TOE Left  08/09/2018   Procedure: AMPUTATION TOE MPJ LEFT;  Surgeon: Gwyneth RevelsFowler, Justin, DPM;  Location: [facility] ORS;  Service: Podiatry;  Laterality: Left;  . INTRAMEDULLARY (IM) NAIL INTERTROCHANTERIC Left 06/12/2016   Procedure: INTRAMEDULLARY (IM) NAIL INTERTROCHANTRIC;  Surgeon: Christena FlakeJohn J Poggi, MD;  Location: [facility] ORS;  Service: Orthopedics;  Laterality: Left;  . LOWER EXTREMITY ANGIOGRAPHY Left 04/03/2018   Procedure: LOWER EXTREMITY ANGIOGRAPHY;  Surgeon: Annice Needyew, Jason S, MD;  Location: [facility] INVASIVE CV LAB;  Service: Cardiovascular;  Laterality: Left;  . none      Prior to Admission medications   Medication Sig Start Date End Date Taking? Authorizing Provider  acetaminophen (ACETAMINOPHEN 8 HOUR) 650 MG CR tablet Take 650 mg by mouth 3 (three) times daily.     [provider]  aspirin EC 81 MG tablet Take 81 mg by mouth daily at 8 pm. (2000)    [provider]  atorvastatin (LIPITOR) 10 MG tablet Take 1 tablet (10 mg total) by mouth daily. Patient taking differently: Take 10 mg by mouth daily at 8 pm. (2000) 04/03/18 04/03/19  Dew, Marlow BaarsJason S, MD  benzonatate (TESSALON) 100 MG capsule Take 1 capsule (100 mg total) by mouth 3 (three) times daily as needed for cough. 10/06/15   Enid BaasKalisetti, Radhika, MD  clopidogrel (PLAVIX) 75 MG tablet Take 1 tablet (75 mg total) by mouth daily. Patient taking differently: Take 75 mg by mouth daily at 8 pm. (2000) 04/03/18   Dew, Marlow BaarsJason S, MD  Melatonin 3 MG CAPS Take 3 mg by mouth at bedtime. (2000)    [provider]  metoprolol succinate (TOPROL-XL) 50 MG 24 hr tablet Take 50 mg by mouth daily. (0800)Take with or immediately following a meal.    [provider]  Nutritional Supplements (NUTRITIONAL DRINK PO) Take 120 mLs by mouth 2 (two) times daily between meals. MEDPASS    [provider]  nystatin (NYSTATIN) powder Apply 2 g topically 2 (two) times daily as needed (rash (under breasts and groin area)).     [provider]    polyethylene glycol (MIRALAX / GLYCOLAX) packet Take 17 g by mouth daily as needed for mild constipation. 06/15/16   Enedina Finner, MD  predniSONE (DELTASONE) 20 MG tablet Take 2 tablets (40 mg total) by mouth daily for 5 days. 08/16/18 08/21/18  Darci Current, MD  Probiotic Product (ALIGN) 4 MG CAPS Take 4 mg by mouth daily. (0800)    [provider]  traMADol (ULTRAM) 50 MG tablet Take 50 mg by mouth every 8 (eight) hours as needed (pain).    [provider]  vitamin B-12 (CYANOCOBALAMIN) 500 MCG tablet Take 500 mcg by mouth daily. (0800)    [provider]    Allergies Actonel [risedronate sodium]; Ceftin [cefuroxime axetil]; Evista [raloxifene]; Miacalcin [calcitonin (salmon)]; and Zyrtec [cetirizine]  No family history on file.  Social History Social History   Tobacco Use  . Smoking status: Never Smoker  . Smokeless tobacco: Never Used  Substance Use Topics  . Alcohol use: No  . Drug use: No    Review of Systems Constitutional: No fever/chills Eyes: No visual changes. ENT: No sore throat. Cardiovascular: Denies chest pain. Respiratory: Denies shortness of breath. Gastrointestinal: No abdominal pain.  No nausea, no vomiting.  No diarrhea.  No constipation. Genitourinary: Negative for dysuria. Musculoskeletal: Negative for neck pain.  Negative for back pain. Integumentary: Positive for rash. Neurological: Negative for headaches, focal weakness or numbness.   ____________________________________________   PHYSICAL EXAM:  VITAL SIGNS: ED Triage Vitals [08/15/18 2248]  Enc Vitals Group     BP (!) 157/61     Pulse Rate 87     Resp 15     Temp 97.9 F (36.6 C)     Temp Source Axillary     SpO2 95 %     Weight      Height      Head Circumference      Peak Flow      Pain Score      Pain Loc      Pain Edu?      Excl. in GC?     Constitutional: Well appearing and in no acute distress. Eyes: Conjunctivae are normal. PERRL.  EOMI. Mouth/Throat: Mucous membranes are moist. Oropharynx non-erythematous. Neck: No stridor.  Cardiovascular: Normal rate, regular rhythm. Good peripheral circulation. Grossly normal heart sounds. Respiratory: Normal respiratory effort.  No retractions. Lungs CTAB. Gastrointestinal: Soft and nontender. No distention.  Musculoskeletal: No lower extremity tenderness nor edema. No gross deformities of extremities. Neurologic:   No gross focal neurologic deficits are appreciated.  Skin:  Skin is warm, dry and intact. Positive for generalized rash. Psychiatric: Mood and affect are normal. Speech and behavior are normal.  ____________________________________________   LABS (all labs ordered are listed, but only abnormal results are displayed)  Labs Reviewed  CBC - Abnormal; Notable for the following components:      Result Value   WBC 16.6 (*)    Hemoglobin 11.8 (*)    All other components within normal limits  COMPREHENSIVE METABOLIC PANEL - Abnormal;  Notable for the following components:   Glucose, Bld 135 (*)    BUN 33 (*)    Creatinine, Ser 1.39 (*)    Calcium 8.8 (*)    GFR calc non Af Amer 31 (*)    GFR calc Af Amer 36 (*)    All other components within normal limits     Procedures   ____________________________________________   INITIAL IMPRESSION / ASSESSMENT AND PLAN / ED COURSE  As part of my medical decision making, I reviewed the following data within the electronic MEDICAL RECORD NUMBER  84 year-old female present with above-stated history and physical exam consistent with drug eruption.  Spoke with the patient's family at length regarding management. ____________________________________________  FINAL CLINICAL IMPRESSION(S) / ED DIAGNOSES  Final diagnoses:  Drug eruption     MEDICATIONS GIVEN DURING THIS VISIT:  Medications - No data to display   ED Discharge Orders         Ordered    predniSONE (DELTASONE) 20 MG tablet  Daily     08/16/18 0022            Note:  This document was prepared using Dragon voice recognition software and may include unintentional dictation errors.    Darci Current, MD 08/16/18 [phone]

## 2018-08-16 ENCOUNTER — Inpatient Hospital Stay: Payer: Medicare Other | Admitting: Family Medicine

## 2018-08-16 DIAGNOSIS — R21 Rash and other nonspecific skin eruption: Secondary | ICD-10-CM | POA: Diagnosis not present

## 2018-08-16 DIAGNOSIS — S98112A Complete traumatic amputation of left great toe, initial encounter: Secondary | ICD-10-CM

## 2018-08-16 DIAGNOSIS — L270 Generalized skin eruption due to drugs and medicaments taken internally: Secondary | ICD-10-CM | POA: Diagnosis not present

## 2018-08-16 LAB — CBC
HCT: 38.1 % (ref 36.0–46.0)
HEMOGLOBIN: 11.8 g/dL — AB (ref 12.0–15.0)
MCH: 27.1 pg (ref 26.0–34.0)
MCHC: 31 g/dL (ref 30.0–36.0)
MCV: 87.4 fL (ref 80.0–100.0)
Platelets: 198 10*3/uL (ref 150–400)
RBC: 4.36 MIL/uL (ref 3.87–5.11)
RDW: 12.8 % (ref 11.5–15.5)
WBC: 16.6 10*3/uL — ABNORMAL HIGH (ref 4.0–10.5)
nRBC: 0 % (ref 0.0–0.2)

## 2018-08-16 LAB — COMPREHENSIVE METABOLIC PANEL
ALT: 11 U/L (ref 0–44)
AST: 21 U/L (ref 15–41)
Albumin: 3.8 g/dL (ref 3.5–5.0)
Alkaline Phosphatase: 91 U/L (ref 38–126)
Anion gap: 9 (ref 5–15)
BUN: 33 mg/dL — ABNORMAL HIGH (ref 8–23)
CO2: 25 mmol/L (ref 22–32)
Calcium: 8.8 mg/dL — ABNORMAL LOW (ref 8.9–10.3)
Chloride: 104 mmol/L (ref 98–111)
Creatinine, Ser: 1.39 mg/dL — ABNORMAL HIGH (ref 0.44–1.00)
GFR calc non Af Amer: 31 mL/min — ABNORMAL LOW (ref >60)
GFR, EST AFRICAN AMERICAN: 36 mL/min — AB (ref >60)
Glucose, Bld: 135 mg/dL — ABNORMAL HIGH (ref 70–99)
Potassium: 3.8 mmol/L (ref 3.5–5.1)
Sodium: 138 mmol/L (ref 135–145)
Total Bilirubin: 0.7 mg/dL (ref 0.3–1.2)
Total Protein: 6.8 g/dL (ref 6.5–8.1)

## 2018-08-16 MED ORDER — PREDNISONE 20 MG PO TABS: 40.0000 mg | ORAL_TABLET | Freq: Every day | ORAL | 0 refills | Status: AC

## 2018-08-16 NOTE — ED Notes (Signed)
Patient out of ED in ambulance in no distress.

## 2018-08-28 DIAGNOSIS — L03116 Cellulitis of left lower limb: Secondary | ICD-10-CM | POA: Diagnosis not present

## 2018-08-29 ENCOUNTER — Other Ambulatory Visit: Payer: Self-pay

## 2018-08-29 ENCOUNTER — Inpatient Hospital Stay
Admission: EM | Admit: 2018-08-29 | Discharge: 2018-08-30 | DRG: 871 | Disposition: A | Discharge status: Skilled Nursing Facility | Payer: Medicare Other | Source: Skilled Nursing Facility | Attending: Internal Medicine | Admitting: Internal Medicine

## 2018-08-29 ENCOUNTER — Emergency Department: Payer: Medicare Other

## 2018-08-29 DIAGNOSIS — Z7982 Long term (current) use of aspirin: Secondary | ICD-10-CM | POA: Diagnosis not present

## 2018-08-29 DIAGNOSIS — E785 Hyperlipidemia, unspecified: Secondary | ICD-10-CM | POA: Diagnosis present

## 2018-08-29 DIAGNOSIS — F0390 Unspecified dementia without behavioral disturbance: Secondary | ICD-10-CM | POA: Diagnosis present

## 2018-08-29 DIAGNOSIS — Z888 Allergy status to other drugs, medicaments and biological substances status: Secondary | ICD-10-CM

## 2018-08-29 DIAGNOSIS — Z79891 Long term (current) use of opiate analgesic: Secondary | ICD-10-CM | POA: Diagnosis not present

## 2018-08-29 DIAGNOSIS — E860 Dehydration: Secondary | ICD-10-CM

## 2018-08-29 DIAGNOSIS — E870 Hyperosmolality and hypernatremia: Secondary | ICD-10-CM | POA: Diagnosis present

## 2018-08-29 DIAGNOSIS — N183 Chronic kidney disease, stage 3 (moderate): Secondary | ICD-10-CM | POA: Diagnosis present

## 2018-08-29 DIAGNOSIS — E7800 Pure hypercholesterolemia, unspecified: Secondary | ICD-10-CM | POA: Diagnosis present

## 2018-08-29 DIAGNOSIS — G4733 Obstructive sleep apnea (adult) (pediatric): Secondary | ICD-10-CM | POA: Diagnosis present

## 2018-08-29 DIAGNOSIS — Z515 Encounter for palliative care: Secondary | ICD-10-CM | POA: Diagnosis present

## 2018-08-29 DIAGNOSIS — Z7902 Long term (current) use of antithrombotics/antiplatelets: Secondary | ICD-10-CM | POA: Diagnosis not present

## 2018-08-29 DIAGNOSIS — Z66 Do not resuscitate: Secondary | ICD-10-CM | POA: Diagnosis present

## 2018-08-29 DIAGNOSIS — M1990 Unspecified osteoarthritis, unspecified site: Secondary | ICD-10-CM | POA: Diagnosis present

## 2018-08-29 DIAGNOSIS — Z89422 Acquired absence of other left toe(s): Secondary | ICD-10-CM | POA: Diagnosis not present

## 2018-08-29 DIAGNOSIS — A419 Sepsis, unspecified organism: Principal | ICD-10-CM | POA: Diagnosis present

## 2018-08-29 DIAGNOSIS — Z79890 Hormone replacement therapy (postmenopausal): Secondary | ICD-10-CM | POA: Diagnosis not present

## 2018-08-29 DIAGNOSIS — G9341 Metabolic encephalopathy: Secondary | ICD-10-CM | POA: Diagnosis present

## 2018-08-29 DIAGNOSIS — D631 Anemia in chronic kidney disease: Secondary | ICD-10-CM | POA: Diagnosis present

## 2018-08-29 DIAGNOSIS — Z79899 Other long term (current) drug therapy: Secondary | ICD-10-CM

## 2018-08-29 DIAGNOSIS — G9340 Encephalopathy, unspecified: Secondary | ICD-10-CM

## 2018-08-29 LAB — CBC WITH DIFFERENTIAL/PLATELET
Abs Immature Granulocytes: 0.12 10*3/uL — ABNORMAL HIGH (ref 0.00–0.07)
Basophils Absolute: 0 10*3/uL (ref 0.0–0.1)
Basophils Relative: 0 %
Eosinophils Absolute: 0 10*3/uL (ref 0.0–0.5)
Eosinophils Relative: 0 %
HCT: 26.3 % — ABNORMAL LOW (ref 36.0–46.0)
HEMOGLOBIN: 7.9 g/dL — AB (ref 12.0–15.0)
IMMATURE GRANULOCYTES: 1 %
Lymphocytes Relative: 6 %
Lymphs Abs: 1.1 10*3/uL (ref 0.7–4.0)
MCH: 27.8 pg (ref 26.0–34.0)
MCHC: 30 g/dL (ref 30.0–36.0)
MCV: 92.6 fL (ref 80.0–100.0)
Monocytes Absolute: 1.2 10*3/uL — ABNORMAL HIGH (ref 0.1–1.0)
Monocytes Relative: 7 %
NEUTROS PCT: 86 %
Neutro Abs: 15.7 10*3/uL — ABNORMAL HIGH (ref 1.7–7.7)
Platelets: 337 10*3/uL (ref 150–400)
RBC: 2.84 MIL/uL — ABNORMAL LOW (ref 3.87–5.11)
RDW: 15 % (ref 11.5–15.5)
WBC: 18.1 10*3/uL — ABNORMAL HIGH (ref 4.0–10.5)
nRBC: 0 % (ref 0.0–0.2)

## 2018-08-29 LAB — COMPREHENSIVE METABOLIC PANEL
ALK PHOS: 57 U/L (ref 38–126)
ALT: 14 U/L (ref 0–44)
AST: 28 U/L (ref 15–41)
Albumin: 3.8 g/dL (ref 3.5–5.0)
Anion gap: 10 (ref 5–15)
BUN: 63 mg/dL — AB (ref 8–23)
CO2: 23 mmol/L (ref 22–32)
Calcium: 8.5 mg/dL — ABNORMAL LOW (ref 8.9–10.3)
Chloride: 114 mmol/L — ABNORMAL HIGH (ref 98–111)
Creatinine, Ser: 1.29 mg/dL — ABNORMAL HIGH (ref 0.44–1.00)
GFR calc Af Amer: 40 mL/min — ABNORMAL LOW (ref >60)
GFR calc non Af Amer: 34 mL/min — ABNORMAL LOW (ref >60)
Glucose, Bld: 118 mg/dL — ABNORMAL HIGH (ref 70–99)
Potassium: 3.8 mmol/L (ref 3.5–5.1)
Sodium: 147 mmol/L — ABNORMAL HIGH (ref 135–145)
Total Bilirubin: 0.7 mg/dL (ref 0.3–1.2)
Total Protein: 6.1 g/dL — ABNORMAL LOW (ref 6.5–8.1)

## 2018-08-29 MED ORDER — ONDANSETRON HCL 4 MG/2ML IJ SOLN: 4.0000 mg | Freq: Four times a day (QID) | INTRAMUSCULAR | Status: DC | PRN

## 2018-08-29 MED ORDER — ACETAMINOPHEN 325 MG PO TABS: 650.0000 mg | ORAL_TABLET | Freq: Four times a day (QID) | ORAL | Status: DC | PRN

## 2018-08-29 MED ORDER — SODIUM CHLORIDE 0.9 % IV SOLN
INTRAVENOUS | Status: DC
  Administered 2018-08-29 – 2018-08-30 (×2): via INTRAVENOUS

## 2018-08-29 MED ORDER — MORPHINE SULFATE (CONCENTRATE) 10 MG/0.5ML PO SOLN
5.0000 mg | ORAL | Status: DC | PRN
  Administered 2018-08-29: 5 mg via ORAL

## 2018-08-29 MED ORDER — MORPHINE SULFATE (CONCENTRATE) 10 MG/0.5ML PO SOLN
5.0000 mg | ORAL | Status: DC | PRN
  Administered 2018-08-29: 5 mg via SUBLINGUAL
  Filled 2018-08-29 (×2): qty 1

## 2018-08-29 MED ORDER — ONDANSETRON 4 MG PO TBDP: 4.0000 mg | ORAL_TABLET | Freq: Four times a day (QID) | ORAL | Status: DC | PRN

## 2018-08-29 MED ORDER — SODIUM CHLORIDE 0.9 % IV BOLUS
500.0000 mL | Freq: Once | INTRAVENOUS | Status: AC
  Administered 2018-08-29: 500 mL via INTRAVENOUS

## 2018-08-29 MED ORDER — LORAZEPAM 2 MG/ML PO CONC
1.0000 mg | ORAL | Status: DC | PRN
  Filled 2018-08-29: qty 0.5

## 2018-08-29 MED ORDER — LORAZEPAM 1 MG PO TABS: 1.0000 mg | ORAL_TABLET | ORAL | Status: DC | PRN

## 2018-08-29 MED ORDER — FENTANYL CITRATE (PF) 100 MCG/2ML IJ SOLN
12.5000 ug | INTRAMUSCULAR | Status: DC | PRN
  Administered 2018-08-29: 12.5 ug via INTRAVENOUS
  Filled 2018-08-29: qty 2

## 2018-08-29 MED ORDER — ACETAMINOPHEN 650 MG RE SUPP: 650.0000 mg | Freq: Four times a day (QID) | RECTAL | Status: DC | PRN

## 2018-08-29 MED ORDER — LORAZEPAM 2 MG/ML IJ SOLN
1.0000 mg | INTRAMUSCULAR | Status: DC | PRN
  Administered 2018-08-30 (×2): 1 mg via INTRAVENOUS
  Filled 2018-08-29 (×2): qty 1

## 2018-08-29 MED ORDER — MORPHINE SULFATE (PF) 2 MG/ML IV SOLN
1.0000 mg | INTRAVENOUS | Status: DC | PRN
  Administered 2018-08-29 – 2018-08-30 (×2): 1 mg via INTRAVENOUS
  Filled 2018-08-29 (×2): qty 1

## 2018-08-29 NOTE — Progress Notes (Signed)
Advanced care plan.  Purpose of the Encounter: Goals of care Parties in Attendance: Patient herself and 2 sons Patient's Decision Capacity: Intact  Subjective/Patient's story: Melissa Cooper  is a 84 y.o. 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  Patient admitted with sepsis.   Objective/Medical story I discussed with the son regarding overall poor prognosis advanced age.  They want their mother to be comfortable and we will initiate comfort care measures   Goals of care determination:  Comfort care measures   CODE STATUS: Comfort care measures   Time spent discussing advanced care planning: 16 minutes

## 2018-08-29 NOTE — ED Triage Notes (Addendum)
Pt come via [facility] from [facility] with c/o AMS per staff. Pt recently had left toe amputation about 3 weeks ago.  EMS reports pt not taking antibiotics, ETCO2<26, and HR-100-120.  Pt given 4 of zofran and 500 fluids.  Pt is alert.

## 2018-08-29 NOTE — ED Provider Notes (Signed)
[facility] Emergency Department Provider Note    First MD Initiated Contact with Patient 08/29/18 1250     (approximate)  I have reviewed the triage vital signs and the nursing notes.   HISTORY  Chief Complaint Altered Mental Status  Level V caveat:  AMS/ advanced dementia   HPI Melissa Cooper is a 84 y.o. female below listed past medical history presents for evaluation of left foot pain as well as concern for worsening confusion and suspected sepsis coming from [facility].  Patient status post recent left toe amputation secondary to gangrenous toe.  Reportedly has not been taking her medications or antibiotics at the facility.  Patient with advanced dementia and is unable to provide any additional history.   ----------------------------------------- 1:57 PM on 08/29/2018 -----------------------------------------  Patient's 2 sons are now at bedside.  States that patient is DNR and they did not want any aggressive therapies.  Would like to trial a bolus of IV fluids but do not want any antibiotics given her any life prolonging measures.  Recognize her poor prognosis and likely that this is representative acute deterioration in the end of her life.  We discussed observation options as well as IV antibiotics including transfusion but they also recognize that she is had significant deterioration and has been ready "to go to heaven "for quite some time.    Past Medical History:  Diagnosis Date  . CKD (chronic kidney disease)    stage 3  . Dementia (HCC)   . HTN (hypertension)   . Hypercholesteremia   . OSA (obstructive sleep apnea)   . Osteoarthritis   . Osteoarthritis   . PA (pernicious anemia)    No family history on file. Past Surgical History:  Procedure Laterality Date  . AMPUTATION TOE Left 08/09/2018   Procedure: AMPUTATION TOE MPJ LEFT;  Surgeon: Gwyneth Revels, DPM;  Location: [facility] ORS;  Service: Podiatry;  Laterality: Left;  .  INTRAMEDULLARY (IM) NAIL INTERTROCHANTERIC Left 06/12/2016   Procedure: INTRAMEDULLARY (IM) NAIL INTERTROCHANTRIC;  Surgeon: Christena Flake, MD;  Location: [facility] ORS;  Service: Orthopedics;  Laterality: Left;  . LOWER EXTREMITY ANGIOGRAPHY Left 04/03/2018   Procedure: LOWER EXTREMITY ANGIOGRAPHY;  Surgeon: Annice Needy, MD;  Location: [facility] INVASIVE CV LAB;  Service: Cardiovascular;  Laterality: Left;  . none     Patient Active Problem List   Diagnosis Date Noted  . Gangrene of toe of left foot (HCC) 08/08/2018  . Hyperlipidemia 05/31/2018  . HTN (hypertension) 03/21/2018  . CKD (chronic kidney disease) 03/21/2018  . Dementia (HCC) 03/21/2018  . 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 Influenza A 09/18/2016  . Acute respiratory insufficiency 09/18/2016  . Abnormal CXR 09/18/2016  . AKI (acute kidney injury) (HCC) 09/18/2016  . Dehydration 09/18/2016  . Hip fracture (HCC) 06/12/2016  . Protein-calorie malnutrition, severe 09/30/2015  . Recurrent colitis due to Clostridium difficile 09/28/2015  . Clostridium difficile colitis 09/05/2015  . Pressure ulcer 08/30/2015  . Sepsis (HCC) 08/29/2015      Prior to Admission medications   Medication Sig Start Date End Date Taking? Authorizing Provider  acetaminophen (ACETAMINOPHEN 8 HOUR) 650 MG CR tablet Take 650 mg by mouth 3 (three) times daily.     [provider]  aspirin EC 81 MG tablet Take 81 mg by mouth daily at 8 pm. (2000)    [provider]  atorvastatin (LIPITOR) 10 MG tablet Take 1 tablet (10 mg total) by mouth  daily. Patient taking differently: Take 10 mg by mouth daily at 8 pm. (2000) 04/03/18 04/03/19  Dew, Marlow BaarsJason S, MD  benzonatate (TESSALON) 100 MG capsule Take 1 capsule (100 mg total) by mouth 3 (three) times daily as needed for cough. 10/06/15   Enid BaasKalisetti, Radhika, MD  clopidogrel (PLAVIX) 75 MG tablet Take 1 tablet (75 mg total) by mouth daily. Patient taking  differently: Take 75 mg by mouth daily at 8 pm. (2000) 04/03/18   Dew, Marlow BaarsJason S, MD  Melatonin 3 MG CAPS Take 3 mg by mouth at bedtime. (2000)    [provider]  metoprolol succinate (TOPROL-XL) 50 MG 24 hr tablet Take 50 mg by mouth daily. (0800)Take with or immediately following a meal.    [provider]  Nutritional Supplements (NUTRITIONAL DRINK PO) Take 120 mLs by mouth 2 (two) times daily between meals. MEDPASS    [provider]  nystatin (NYSTATIN) powder Apply 2 g topically 2 (two) times daily as needed (rash (under breasts and groin area)).     [provider]  polyethylene glycol (MIRALAX / GLYCOLAX) packet Take 17 g by mouth daily as needed for mild constipation. 06/15/16   Enedina FinnerPatel, Sona, MD  Probiotic Product (ALIGN) 4 MG CAPS Take 4 mg by mouth daily. (0800)    [provider]  traMADol (ULTRAM) 50 MG tablet Take 50 mg by mouth every 8 (eight) hours as needed (pain).    [provider]  vitamin B-12 (CYANOCOBALAMIN) 500 MCG tablet Take 500 mcg by mouth daily. (0800)    [provider]    Allergies Actonel [risedronate sodium]; Ceftin [cefuroxime axetil]; Evista [raloxifene]; Miacalcin [calcitonin (salmon)]; and Zyrtec [cetirizine]    Social History Social History   Tobacco Use  . Smoking status: Never Smoker  . Smokeless tobacco: Never Used  Substance Use Topics  . Alcohol use: No  . Drug use: No    Review of Systems Patient denies headaches, rhinorrhea, blurry vision, numbness, shortness of breath, chest pain, edema, cough, abdominal pain, nausea, vomiting, diarrhea, dysuria, fevers, rashes or hallucinations unless otherwise stated above in HPI. ____________________________________________   PHYSICAL EXAM:  VITAL SIGNS: Vitals:   08/29/18 1255 08/29/18 1300  BP: (!) 112/101   Pulse: (!) 41 83  Resp: (!) 21 (!) 21  Temp: (!) 97.3 F (36.3 C)   SpO2: 96% 95%    Constitutional: Alert but  encephalopathic, ill appearing.  Eyes: Conjunctivae are normal.  Head: Atraumatic. Nose: No congestion/rhinnorhea. Mouth/Throat: Mucous membranes are moist.   Neck: No stridor. Painless ROM.  Cardiovascular: tachycardic regular rhythm. Grossly normal heart sounds.  Good peripheral circulation. Respiratory: Normal respiratory effort.  No retractions. Lungs with coarse bibasilar breathsounds Gastrointestinal: Soft and nontender. No distention. No abdominal bruits. No CVA tenderness. Genitourinary: deferred Musculoskeletal: No lower extremity tenderness nor edema. 1+ dp pulses.  S/p left great toe amputation.  No overlying warmth, scab overlying incision without purulent drainage. No joint effusions. Neurologic:  Normal speech and language. No gross focal neurologic deficits are appreciated. No facial droop Skin:  Skin is warm, dry and intact. No rash noted. Psychiatric: Mood and affect are normal. Speech and behavior are normal.  ____________________________________________   LABS (all labs ordered are listed, but only abnormal results are displayed)  Results for orders placed or performed during the hospital encounter of 08/29/18 (from the past 24 hour(s))  CBC with Differential/Platelet     Status: Abnormal   Collection Time: 08/29/18  1:09 PM  Result Value Ref  Range   WBC 18.1 (H) 4.0 - 10.5 K/uL   RBC 2.84 (L) 3.87 - 5.11 MIL/uL   Hemoglobin 7.9 (L) 12.0 - 15.0 g/dL   HCT 16.1 (L) 09.6 - 04.5 %   MCV 92.6 80.0 - 100.0 fL   MCH 27.8 26.0 - 34.0 pg   MCHC 30.0 30.0 - 36.0 g/dL   RDW 40.9 81.1 - 91.4 %   Platelets 337 150 - 400 K/uL   nRBC 0.0 0.0 - 0.2 %   Neutrophils Relative % 86 %   Neutro Abs 15.7 (H) 1.7 - 7.7 K/uL   Lymphocytes Relative 6 %   Lymphs Abs 1.1 0.7 - 4.0 K/uL   Monocytes Relative 7 %   Monocytes Absolute 1.2 (H) 0.1 - 1.0 K/uL   Eosinophils Relative 0 %   Eosinophils Absolute 0.0 0.0 - 0.5 K/uL   Basophils Relative 0 %   Basophils Absolute 0.0 0.0 - 0.1  K/uL   Immature Granulocytes 1 %   Abs Immature Granulocytes 0.12 (H) 0.00 - 0.07 K/uL  Comprehensive metabolic panel     Status: Abnormal   Collection Time: 08/29/18  1:09 PM  Result Value Ref Range   Sodium 147 (H) 135 - 145 mmol/L   Potassium 3.8 3.5 - 5.1 mmol/L   Chloride 114 (H) 98 - 111 mmol/L   CO2 23 22 - 32 mmol/L   Glucose, Bld 118 (H) 70 - 99 mg/dL   BUN 63 (H) 8 - 23 mg/dL   Creatinine, Ser 7.82 (H) 0.44 - 1.00 mg/dL   Calcium 8.5 (L) 8.9 - 10.3 mg/dL   Total Protein 6.1 (L) 6.5 - 8.1 g/dL   Albumin 3.8 3.5 - 5.0 g/dL   AST 28 15 - 41 U/L   ALT 14 0 - 44 U/L   Alkaline Phosphatase 57 38 - 126 U/L   Total Bilirubin 0.7 0.3 - 1.2 mg/dL   GFR calc non Af Amer 34 (L) >60 mL/min   GFR calc Af Amer 40 (L) >60 mL/min   Anion gap 10 5 - 15   ____________________________________________ ____________________________________________  RADIOLOGY  I personally reviewed all radiographic images ordered to evaluate for the above acute complaints and reviewed radiology reports and findings.  These findings were personally discussed with the patient.  Please see medical record for radiology report.  ____________________________________________   PROCEDURES  Procedure(s) performed:  .Critical Care Performed by: Willy Eddy, MD Authorized by: Willy Eddy, MD   Critical care provider statement:    Critical care time (minutes):  10   Critical care time was exclusive of:  Separately billable procedures and treating other patients   Critical care was necessary to treat or prevent imminent or life-threatening deterioration of the following conditions:  Dehydration   Critical care was time spent personally by me on the following activities:  Development of treatment plan with patient or surrogate, discussions with consultants, evaluation of patient's response to treatment, examination of patient, obtaining history from patient or surrogate, ordering and performing  treatments and interventions, ordering and review of laboratory studies, ordering and review of radiographic studies, pulse oximetry, re-evaluation of patient's condition and review of old charts      Critical Care performed: yes ____________________________________________   INITIAL IMPRESSION / ASSESSMENT AND PLAN / ED COURSE  Pertinent labs & imaging results that were available during my care of the patient were reviewed by me and considered in my medical decision making (see chart for details).   DDX: Dehydration, sepsis,  electrolyte abnormality, pneumonia  Tonica L Chestine SporeClark is a 84 y.o. who presents to the ED with symptoms as described above.  Patient very frail critically ill-appearing tachycardic and profoundly dehydrated.  Not febrile right now.  Does have some erythematous changes left great toe status post amputation which may be a source of infection.  Possible pneumonia.  Blood will be sent off the patient does arrive with DNR/DNI.  Will discuss goals of care with family . Clinical Course as of Aug 30 1403  Tue Aug 29, 2018  1402 Based on patient's acute hypernatremia evidence of prerenal azotemia and symptoms will discuss with hospitalist for admission.  After lengthy discussion with family regarding goals of care they are agreeable to trial of IV hydration but no antibiotics at this time or further life-sustaining measures.  Requesting admission the hospital for comfort measures.   [PR]    Clinical Course User Index [PR] Willy Eddyobinson, Elaine Roanhorse, MD     As part of my medical decision making, I reviewed the following data within the electronic MEDICAL RECORD NUMBER Nursing notes reviewed and incorporated, Labs reviewed, notes from prior ED visits.   ____________________________________________   FINAL CLINICAL IMPRESSION(S) / ED DIAGNOSES  Final diagnoses:  Acute hypernatremia  Acute encephalopathy  Dehydration      NEW MEDICATIONS STARTED DURING THIS VISIT:  New  Prescriptions   No medications on file     Note:  This document was prepared using Dragon voice recognition software and may include unintentional dictation errors.    Willy Eddyobinson, Nalaysia Manganiello, MD 08/29/18 [phone]

## 2018-08-29 NOTE — H&P (Signed)
[place] [facility] [place] NAME: Melissa Cooper    MR#:  [id number]  DATE OF BIRTH:  1918-06-25  DATE OF ADMISSION:  08/29/2018  PRIMARY CARE PHYSICIAN: Venia Carbon, MD   REQUESTING/REFERRING PHYSICIAN: Merlyn Lot, MD  CHIEF COMPLAINT:   Chief Complaint  Patient presents with  . Altered Mental Status    HISTORY OF PRESENT ILLNESS: Melissa Cooper  is a 84 y.o. 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  Patient was discharged back to her facility at [facility] which she has been living for long time.  Today noted to have worsening confusion and there was concern for sepsis and [facility].  Patient was not taking her medications or eating or drinking for the last 2 days.  HER-2 sons are at bedside here and they report that patient has deteriorated over the past few months. PAST MEDICAL HISTORY:   Past Medical History:  Diagnosis Date  . CKD (chronic kidney disease)    stage 3  . Dementia ([place])   . HTN (hypertension)   . Hypercholesteremia   . OSA (obstructive sleep apnea)   . Osteoarthritis   . Osteoarthritis   . PA (pernicious anemia)     PAST SURGICAL HISTORY:  Past Surgical History:  Procedure Laterality Date  . AMPUTATION TOE Left 08/09/2018   Procedure: AMPUTATION TOE MPJ LEFT;  Surgeon: Samara Deist, DPM;  Location: [facility] ORS;  Service: Podiatry;  Laterality: Left;  . INTRAMEDULLARY (IM) NAIL INTERTROCHANTERIC Left 06/12/2016   Procedure: INTRAMEDULLARY (IM) NAIL INTERTROCHANTRIC;  Surgeon: Corky Mull, MD;  Location: [facility] ORS;  Service: Orthopedics;  Laterality: Left;  . LOWER EXTREMITY ANGIOGRAPHY Left 04/03/2018   Procedure: LOWER EXTREMITY ANGIOGRAPHY;  Surgeon: Algernon Huxley, MD;  Location: [facility];  Service: Cardiovascular;   Laterality: Left;  . none      SOCIAL HISTORY:  Social History   Tobacco Use  . Smoking status: Never Smoker  . Smokeless tobacco: Never Used  Substance Use Topics  . Alcohol use: No    FAMILY HISTORY: No family history on file.  DRUG ALLERGIES:  Allergies  Allergen Reactions  . Actonel [Risedronate Sodium] Other (See Comments)    Unknown reaction  . Ceftin [Cefuroxime Axetil] Other (See Comments)    Unknown reaction  . Evista [Raloxifene] Other (See Comments)    Unknown reaction  . Miacalcin [Calcitonin ([place])] Other (See Comments)    Unknown reaction  . Zyrtec [Cetirizine] Other (See Comments)    Unknown reaction    REVIEW OF SYSTEMS:   CONSTITUTIONAL: Patient confused    MEDICATIONS AT HOME:  Prior to Admission medications   Medication Sig Start Date End Date Taking? Authorizing Provider  acetaminophen (TYLENOL) 650 MG CR tablet Take 650 mg by mouth 3 (three) times daily.   Yes [provider]  acetaminophen (TYLENOL) 650 MG suppository Place 650 mg rectally every 4 (four) hours as needed for moderate pain or fever.   Yes [provider]  amoxicillin-clavulanate (AUGMENTIN) 600-42.9 MG/5ML suspension Take 900 mg by mouth 2 (two) times daily. 08/28/18 09/05/18 Yes [provider]  aspirin EC 81 MG tablet Take 81 mg by mouth at bedtime.    Yes [provider]  atorvastatin (LIPITOR) 10 MG tablet Take 1 tablet (10 mg total) by mouth daily. Patient taking  differently: Take 10 mg by mouth every evening.  04/03/18 04/03/19 Yes Dew, Erskine Squibb, MD  benzonatate (TESSALON) 100 MG capsule Take 1 capsule (100 mg total) by mouth 3 (three) times daily as needed for cough. 10/06/15  Yes Gladstone Lighter, MD  clopidogrel (PLAVIX) 75 MG tablet Take 1 tablet (75 mg total) by mouth daily. Patient taking differently: Take 75 mg by mouth at bedtime.  04/03/18  Yes Dew, Erskine Squibb, MD  diphenhydrAMINE (BENADRYL) 25 MG tablet Take 25 mg by mouth every 8 (eight)  hours as needed for allergies.   Yes [provider]  levofloxacin (LEVAQUIN) 250 MG tablet Take 250 mg by mouth daily. 08/28/18 09/04/18 Yes [provider]  Melatonin 3 MG CAPS Take 3 mg by mouth at bedtime.    Yes [provider]  metoprolol succinate (TOPROL-XL) 50 MG 24 hr tablet Take 50 mg by mouth daily.    Yes [provider]  Nutritional Supplements (NUTRITIONAL DRINK PO) Take 120 mLs by mouth 2 (two) times daily between meals. MEDPASS   Yes [provider]  nystatin (NYSTATIN) powder Apply 2 g topically 2 (two) times daily as needed (rash under breasts and groin area).    Yes [provider]  ondansetron (ZOFRAN) 4 MG tablet Take 4 mg by mouth 3 (three) times daily as needed for nausea or vomiting.   Yes [provider]  polyethylene glycol (MIRALAX / GLYCOLAX) packet Take 17 g by mouth daily as needed for mild constipation. 06/15/16  Yes Fritzi Mandes, MD  Probiotic Product (ALIGN) 4 MG CAPS Take 4 mg by mouth daily.    Yes [provider]  traMADol (ULTRAM) 50 MG tablet Take 50 mg by mouth every 8 (eight) hours as needed for moderate pain or severe pain.    Yes [provider]  vitamin B-12 (CYANOCOBALAMIN) 500 MCG tablet Take 500 mcg by mouth daily.    Yes [provider]      PHYSICAL EXAMINATION:   VITAL SIGNS: Blood pressure (!) 112/101, pulse 83, temperature (!) 97.3 F (36.3 C), temperature source Axillary, resp. rate (!) 21, height 5' (1.524 m), weight 53.3 kg, SpO2 95 %.  GENERAL:  84 y.o.-year-old patient lying in the bed agitated EYES: Pupils equal, round, reactive to light and accommodation. No scleral icterus. HEENT: Head atraumatic, normocephalic. Oropharynx and nasopharynx clear.  NECK:  Supple, no jugular venous distention. No thyroid enlargement, no tenderness.  LUNGS: Rhonchus breath sounds bilaterally CARDIOVASCULAR: S1, S2 normal. No murmurs, rubs, or gallops.  ABDOMEN: Soft,  nontender, nondistended. Bowel sounds present. No organomegaly or mass.  EXTREMITIES: No pedal edema, cyanosis, or clubbing.  NEUROLOGIC: Limited PSYCHIATRIC: The patient is alert and oriented x 3.  SKIN: Left great toe there is no evidence of erythema wound looks good  LABORATORY PANEL:   CBC Recent Labs  Lab 08/29/18 1309  WBC 18.1*  HGB 7.9*  HCT 26.3*  PLT 337  MCV 92.6  MCH 27.8  MCHC 30.0  RDW 15.0  LYMPHSABS 1.1  MONOABS 1.2*  EOSABS 0.0  BASOSABS 0.0   ------------------------------------------------------------------------------------------------------------------  Chemistries  Recent Labs  Lab 08/29/18 1309  NA 147*  K 3.8  CL 114*  CO2 23  GLUCOSE 118*  BUN 63*  CREATININE 1.29*  CALCIUM 8.5*  AST 28  ALT 14  ALKPHOS 57  BILITOT 0.7   ------------------------------------------------------------------------------------------------------------------ estimated creatinine clearance is 17.5 mL/min (A) (by C-G formula based on SCr of 1.29 mg/dL (H)). ------------------------------------------------------------------------------------------------------------------ No results for  input(s): TSH, T4TOTAL, T3FREE, THYROIDAB in the last 72 hours.  Invalid input(s): FREET3   Coagulation profile No results for input(s): INR, PROTIME in the last 168 hours. ------------------------------------------------------------------------------------------------------------------- No results for input(s): DDIMER in the last 72 hours. -------------------------------------------------------------------------------------------------------------------  Cardiac Enzymes No results for input(s): CKMB, TROPONINI, MYOGLOBIN in the last 168 hours.  Invalid input(s): CK ------------------------------------------------------------------------------------------------------------------ Invalid input(s):  POCBNP  ---------------------------------------------------------------------------------------------------------------  Urinalysis    Component Value Date/Time   COLORURINE YELLOW (A) 09/18/2016 1000   APPEARANCEUR CLOUDY (A) 09/18/2016 1000   APPEARANCEUR Turbid 07/01/2014 2114   LABSPEC 1.015 09/18/2016 1000   LABSPEC 1.013 07/01/2014 2114   PHURINE 5.0 09/18/2016 1000   GLUCOSEU NEGATIVE 09/18/2016 1000   GLUCOSEU Negative 07/01/2014 2114   HGBUR NEGATIVE 09/18/2016 1000   BILIRUBINUR NEGATIVE 09/18/2016 1000   BILIRUBINUR Negative 07/01/2014 2114   KETONESUR NEGATIVE 09/18/2016 1000   PROTEINUR 100 (A) 09/18/2016 1000   NITRITE NEGATIVE 09/18/2016 1000   LEUKOCYTESUR NEGATIVE 09/18/2016 1000   LEUKOCYTESUR 3+ 07/01/2014 2114     RADIOLOGY: Dg Chest Portable 1 View  Result Date: 08/29/2018 CLINICAL DATA:  Altered mental status. EXAM: PORTABLE CHEST 1 VIEW COMPARISON:  11/13/2016 FINDINGS: Heart size and pulmonary vascularity are normal and the lungs are clear. Large hiatal hernia. Aortic atherosclerosis. No acute bone abnormality. Slight degenerative changes of both shoulders. IMPRESSION: No acute cardiopulmonary abnormalities. Large hiatal hernia. Aortic atherosclerosis. Electronically Signed   By: Lorriane Shire M.D.   On: 08/29/2018 13:32   Dg Foot Complete Left  Result Date: 08/29/2018 CLINICAL DATA:  Recent left great toe amputation. EXAM: LEFT FOOT - COMPLETE 3+ VIEW COMPARISON:  Radiographs dated 03/30/2018 FINDINGS: No acute abnormality. Interval amputation of the left great toe. Bunion formation and soft tissue calcifications around the head of the first metatarsal. Patchy osteopenia. No evidence of osteomyelitis. IMPRESSION: No acute abnormality. Electronically Signed   By: Lorriane Shire M.D.   On: 08/29/2018 13:34    EKG: Orders placed or performed during the hospital encounter of 09/18/16  . ED EKG  . ED EKG    IMPRESSION AND PLAN: Patient is 84 year old  white female being brought to the hospital with possible sepsis  1.  Sepsis etiology unclear.  Case was discussed with the sons at bedside.  They report that patient has deteriorated over the past few months with her advanced age they do not want any type of aggressive care.  They would like her to get some IV fluid.  But no antibiotics.  I explained to them what comfort measures was patient will be placed for comfort measures.  We will discontinue all other antibiotics and medications.  We will not draw any blood work.    All the records are reviewed and case discussed with ED provider. Management plans discussed with the patient, family and they are in agreement.  CODE STATUS:    Code Status Orders  (From admission, onward)         Start     Ordered   08/29/18 1405  Do not attempt resuscitation/DNR  Continuous    Question Answer Comment  In the event of cardiac or respiratory ARREST Do not call a "code blue"   In the event of cardiac or respiratory ARREST Do not perform Intubation, CPR, defibrillation or ACLS   In the event of cardiac or respiratory ARREST Use medication by any route, position, wound care, and other measures to relive pain and suffering. May use oxygen, suction and manual treatment of airway obstruction  as needed for comfort.      08/29/18 1404        Code Status History    Date Active Date Inactive Code Status Order ID Comments User Context   08/08/2018 1621 08/11/2018 1553 DNR [id number]  Demetrios Loll, MD Inpatient   09/18/2016 1500 09/20/2016 1936 DNR [id number]  Idelle Crouch, MD Inpatient   06/12/2016 1202 06/16/2016 0123 DNR [id number]  Fritzi Mandes, MD Inpatient   06/12/2016 0945 06/12/2016 1201 Full Code [id number]  Fritzi Mandes, MD Inpatient   09/28/2015 1322 10/06/2015 1514 DNR [id number]  Gladstone Lighter, MD Inpatient   08/29/2015 1835 09/05/2015 1835 DNR [id number]  Gladstone Lighter, MD Inpatient    Advance Directive Documentation     Most Recent Value   Type of Advance Directive  Out of facility DNR (pink MOST or yellow form), Healthcare Power of Attorney  Pre-existing out of facility DNR order (yellow form or pink MOST form)  -  "MOST" Form in Place?  -       TOTAL TIME TAKING CARE OF THIS PATIENT: 40mnutes.    SDustin FlockM.D on 08/29/2018 at 2:30 PM  Between 7am to 6pm - Pager - [phone]  After 6pm go to [email or web address] - password [email or web address] Office  [phone] CC: Primary care physician; LVenia Carbon MD

## 2018-08-29 NOTE — ED Notes (Signed)
Attempted to call report, per Secretary [facility] and will call me back once she gets back to the floor.

## 2018-08-30 MED ORDER — LORAZEPAM 0.5 MG PO TABS: 0.5000 mg | ORAL_TABLET | Freq: Four times a day (QID) | ORAL | 0 refills | Status: AC | PRN

## 2018-08-30 MED ORDER — MORPHINE SULFATE (CONCENTRATE) 10 MG /0.5 ML PO SOLN: 10.0000 mg | ORAL | 0 refills | Status: AC | PRN

## 2018-08-30 NOTE — Progress Notes (Signed)
08/30/2018  1:58 PM  Called report for discharge. Spoke with Aundra Millet the nurse at [facility] [phone].  Madie Reno, RN

## 2018-08-30 NOTE — Progress Notes (Signed)
08/30/2018 1:12 PM   Called EMS transport for patient to discharge to [facility]. Per [place] patient is second on the list for transport.   Madie RenoMisty D Blue Winther, RN

## 2018-08-30 NOTE — Discharge Summary (Signed)
[place] [facility] [place] NAME: Melissa Cooper    MR#:  [id number]  DATE OF BIRTH:  09-Apr-1918  DATE OF ADMISSION:  08/29/2018 ADMITTING PHYSICIAN: Dustin Flock, MD  DATE OF DISCHARGE: 08/30/2018  PRIMARY CARE PHYSICIAN: Venia Carbon, MD   ADMISSION DIAGNOSIS:  Dehydration [E86.0] Acute hypernatremia [E87.0] Acute encephalopathy [G93.40]  DISCHARGE DIAGNOSIS:  Active Problems:   Sepsis ([place])   SECONDARY DIAGNOSIS:   Past Medical History:  Diagnosis Date  . CKD (chronic kidney disease)    stage 3  . Dementia ([place])   . HTN (hypertension)   . Hypercholesteremia   . OSA (obstructive sleep apnea)   . Osteoarthritis   . Osteoarthritis   . PA (pernicious anemia)      ADMITTING HISTORY  HISTORY OF PRESENT ILLNESS: Melissa Cooper  is a 85 y.o. 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  Patient was discharged back to her facility at [facility] which she has been living for long time.  Today noted to have worsening confusion and there was concern for sepsis and [facility].  Patient was not taking her medications or eating or drinking for the last 2 days.  HER-2 sons are at bedside here and they report that patient has deteriorated over the past few months.  HOSPITAL COURSE:   *Sepsis *Acute metabolic encephalopathy over dementia *Dehydration *Hypernatremia *Acutely worsening anemia of unknown cause *CKD 3 *Hypertension  Patient admitted to medical floor on comfort measures.  Initially IV fluids continued as per family request.  Discussed with son today and they have requested complete comfort measures and stop IV fluids.  Transfer to [facility] home with hospice services.  Patient will be discharged to [facility] with oral morphine and Ativan as  needed. CONSULTS OBTAINED:    DRUG ALLERGIES:   Allergies  Allergen Reactions  . Actonel [Risedronate Sodium] Other (See Comments)    Unknown reaction  . Ceftin [Cefuroxime Axetil] Other (See Comments)    Unknown reaction  . Evista [Raloxifene] Other (See Comments)    Unknown reaction  . Miacalcin [Calcitonin ([place])] Other (See Comments)    Unknown reaction  . Zyrtec [Cetirizine] Other (See Comments)    Unknown reaction    DISCHARGE MEDICATIONS:   Allergies as of 08/30/2018      Reactions   Actonel [risedronate Sodium] Other (See Comments)   Unknown reaction   Ceftin [cefuroxime Axetil] Other (See Comments)   Unknown reaction   Evista [raloxifene] Other (See Comments)   Unknown reaction   Miacalcin [calcitonin (salmon)] Other (See Comments)   Unknown reaction   Zyrtec [cetirizine] Other (See Comments)   Unknown reaction      Medication List    STOP taking these medications   acetaminophen 650 MG CR tablet Commonly known as:  TYLENOL   acetaminophen 650 MG suppository Commonly known as:  TYLENOL   ALIGN 4 MG Caps   amoxicillin-clavulanate 600-42.9 MG/5ML suspension Commonly known as:  AUGMENTIN   aspirin EC 81 MG tablet   atorvastatin 10 MG tablet Commonly known as:  LIPITOR   benzonatate 100 MG capsule Commonly known as:  TESSALON   clopidogrel 75 MG tablet Commonly known as:  PLAVIX   diphenhydrAMINE 25 MG tablet Commonly known as:  BENADRYL   levofloxacin 250 MG tablet Commonly known as:  LEVAQUIN  Melatonin 3 MG Caps   metoprolol succinate 50 MG 24 hr tablet Commonly known as:  TOPROL-XL   NUTRITIONAL DRINK PO   nystatin powder Generic drug:  nystatin   ondansetron 4 MG tablet Commonly known as:  ZOFRAN   polyethylene glycol packet Commonly known as:  MIRALAX / GLYCOLAX   traMADol 50 MG tablet Commonly known as:  ULTRAM   vitamin B-12 500 MCG tablet Commonly known as:  CYANOCOBALAMIN     TAKE these medications   LORazepam  0.5 MG tablet Commonly known as:  ATIVAN Take 1 tablet (0.5 mg total) by mouth every 6 (six) hours as needed for anxiety.   morphine CONCENTRATE 10 mg / 0.5 ml concentrated solution Take 0.5 mLs (10 mg total) by mouth every 4 (four) hours as needed for moderate pain, severe pain or shortness of breath.       Today   VITAL SIGNS:  Blood pressure 91/72, pulse 97, temperature (!) 97.5 F (36.4 C), temperature source Oral, resp. rate 16, height 5' (1.524 m), weight 53.3 kg, SpO2 90 %.  I/O:    Intake/Output Summary (Last 24 hours) at 08/30/2018 1149 Last data filed at 08/30/2018 0938 Gross per 24 hour  Intake 1532.9 ml  Output -  Net 1532.9 ml    PHYSICAL EXAMINATION:  Physical Exam  GENERAL:  85 y.o.-year-old patient lying in the bed PSYCHIATRIC: The patient is drowzy  DATA REVIEW:   CBC Recent Labs  Lab 08/29/18 1309  WBC 18.1*  HGB 7.9*  HCT 26.3*  PLT 337    Chemistries  Recent Labs  Lab 08/29/18 1309  NA 147*  K 3.8  CL 114*  CO2 23  GLUCOSE 118*  BUN 63*  CREATININE 1.29*  CALCIUM 8.5*  AST 28  ALT 14  ALKPHOS 57  BILITOT 0.7    Cardiac Enzymes No results for input(s): TROPONINI in the last 168 hours.  Microbiology Results  Results for orders placed or performed during the hospital encounter of 08/08/18  MRSA PCR Screening     Status: Abnormal   Collection Time: 08/08/18  4:28 PM  Result Value Ref Range Status   MRSA by PCR POSITIVE (A) NEGATIVE Final    Comment:        The GeneXpert MRSA Assay (FDA approved for NASAL specimens only), is one component of a comprehensive MRSA colonization surveillance program. It is not intended to diagnose MRSA infection nor to guide or monitor treatment for MRSA infections. RESULT CALLED TO, READ BACK BY AND VERIFIED WITH: Sula Rumple 08/08/18 2016 REC Performed at [facility], [address], [place], [place] [address]     RADIOLOGY:  Dg Chest Portable 1 View  Result Date:  08/29/2018 CLINICAL DATA:  Altered mental status. EXAM: PORTABLE CHEST 1 VIEW COMPARISON:  11/13/2016 FINDINGS: Heart size and pulmonary vascularity are normal and the lungs are clear. Large hiatal hernia. Aortic atherosclerosis. No acute bone abnormality. Slight degenerative changes of both shoulders. IMPRESSION: No acute cardiopulmonary abnormalities. Large hiatal hernia. Aortic atherosclerosis. Electronically Signed   By: Lorriane Shire M.D.   On: 08/29/2018 13:32   Dg Foot Complete Left  Result Date: 08/29/2018 CLINICAL DATA:  Recent left great toe amputation. EXAM: LEFT FOOT - COMPLETE 3+ VIEW COMPARISON:  Radiographs dated 03/30/2018 FINDINGS: No acute abnormality. Interval amputation of the left great toe. Bunion formation and soft tissue calcifications around the head of the first metatarsal. Patchy osteopenia. No evidence of osteomyelitis. IMPRESSION: No acute abnormality. Electronically Signed  By: Lorriane Shire M.D.   On: 08/29/2018 13:34    Follow up with PCP in 1 week.  Management plans discussed with the patient, family and they are in agreement.  CODE STATUS:     Code Status Orders  (From admission, onward)         Start     Ordered   08/29/18 1632  Do not attempt resuscitation (DNR)  Continuous    Question Answer Comment  In the event of cardiac or respiratory ARREST Do not call a "code blue"   In the event of cardiac or respiratory ARREST Do not perform Intubation, CPR, defibrillation or ACLS   In the event of cardiac or respiratory ARREST Use medication by any route, position, wound care, and other measures to relive pain and suffering. May use oxygen, suction and manual treatment of airway obstruction as needed for comfort.      08/29/18 1631        Code Status History    Date Active Date Inactive Code Status Order ID Comments User Context   08/29/2018 1404 08/29/2018 1631 DNR [id number]  Merlyn Lot, MD ED   08/08/2018 1621 08/11/2018 1553 DNR [id number]   Demetrios Loll, MD Inpatient   09/18/2016 1500 09/20/2016 1936 DNR [id number]  Idelle Crouch, MD Inpatient   06/12/2016 1202 06/16/2016 0123 DNR [id number]  Fritzi Mandes, MD Inpatient   06/12/2016 0945 06/12/2016 1201 Full Code [id number]  Fritzi Mandes, MD Inpatient   09/28/2015 1322 10/06/2015 1514 DNR [id number]  Gladstone Lighter, MD Inpatient   08/29/2015 1835 09/05/2015 [place] DNR [id number]  Gladstone Lighter, MD Inpatient    Advance Directive Documentation     Most Recent Value  Type of Advance Directive  Out of facility DNR (pink MOST or yellow form)  Pre-existing out of facility DNR order (yellow form or pink MOST form)  -  "MOST" Form in Place?  -      TOTAL TIME TAKING CARE OF THIS PATIENT ON DAY OF DISCHARGE: more than 30 minutes.   Leia Alf  M.D on 08/30/2018 at 11:49 AM  Between 7am to 6pm - Pager - [phone]  After 6pm go to [email or web address] - password [email or web address] [place] [facility]  Office  [phone]  CC: Primary care physician; Venia Carbon, MD  Note: This dictation was prepared with Dragon dictation along with smaller phrase technology. Any transcriptional errors that result from this process are unintentional.

## 2018-08-30 NOTE — Progress Notes (Signed)
08/30/2018 2:00 PM  Shivaun L [facility] to be D/C'd Skilled nursing facility per MD order.  Discussed prescriptions and follow up appointments with the patient. Prescriptions given to patient, medication list explained in detail. Pt verbalized understanding.  Allergies as of 08/30/2018      Reactions   Actonel [risedronate Sodium] Other (See Comments)   Unknown reaction   Ceftin [cefuroxime Axetil] Other (See Comments)   Unknown reaction   Evista [raloxifene] Other (See Comments)   Unknown reaction   Miacalcin [calcitonin (sal[place])] Other (See Comments)   Unknown reaction   Zyrtec [cetirizine] Other (See Comments)   Unknown reaction      Medication List    STOP taking these medications   acetaminophen 650 MG CR tablet Commonly known as:  TYLENOL   acetaminophen 650 MG suppository Commonly known as:  TYLENOL   ALIGN 4 MG Caps   amoxicillin-clavulanate 600-42.9 MG/5ML suspension Commonly known as:  AUGMENTIN   aspirin EC 81 MG tablet   atorvastatin 10 MG tablet Commonly known as:  LIPITOR   benzonatate 100 MG capsule Commonly known as:  TESSALON   clopidogrel 75 MG tablet Commonly known as:  PLAVIX   diphenhydrAMINE 25 MG tablet Commonly known as:  BENADRYL   levofloxacin 250 MG tablet Commonly known as:  LEVAQUIN   Melatonin 3 MG Caps   metoprolol succinate 50 MG 24 hr tablet Commonly known as:  TOPROL-XL   NUTRITIONAL DRINK PO   nystatin powder Generic drug:  nystatin   ondansetron 4 MG tablet Commonly known as:  ZOFRAN   polyethylene glycol packet Commonly known as:  MIRALAX / GLYCOLAX   traMADol 50 MG tablet Commonly known as:  ULTRAM   vitamin B-12 500 MCG tablet Commonly known as:  CYANOCOBALAMIN     TAKE these medications   LORazepam 0.5 MG tablet Commonly known as:  ATIVAN Take 1 tablet (0.5 mg total) by mouth every 6 (six) hours as needed for anxiety.   morphine CONCENTRATE 10 mg / 0.5 ml concentrated solution Take 0.5 mLs (10 mg total) by  mouth every 4 (four) hours as needed for moderate pain, severe pain or shortness of breath.       Vitals:   08/30/18 0635 08/30/18 1324  BP:  97/85  Pulse:  (!) 162  Resp:  20  Temp:  98.3 F (36.8 C)  SpO2: 90% (!) 84%    Skin clean, dry and intact without evidence of skin break down, no evidence of skin tears noted. IV catheter discontinued intact. Site without signs and symptoms of complications. Dressing and pressure applied. Pt denies pain at this time. No complaints noted.  An After Visit Summary was printed and given to the patient. Patient escorted via WC, and D/C home via private auto.  Madie Reno, RN

## 2018-08-30 NOTE — Discharge Instructions (Signed)
Diet and activity as tolerated.

## 2018-08-30 NOTE — Clinical Social Work Note (Signed)
Clinical Social Work Assessment  Patient Details  Name: Melissa Cooper MRN: [id number] Date of Birth: [birth year]  Date of referral:  08/30/18               Reason for consult:  Discharge Planning                Permission sought to share information with:  Facility Industrial/product designer granted to share information::  Yes, Verbal Permission Granted  Name::        Agency::     Relationship::     Contact Information:     Housing/Transportation Living arrangements for the past 2 months:  Skilled Nursing Facility Source of Information:  Adult Children Patient Interpreter Needed:  None Criminal Activity/Legal Involvement Pertinent to Current Situation/Hospitalization:  No - Comment as needed Significant Relationships:  Adult Children Lives with:  Facility Resident Do you feel safe going back to the place where you live?  Yes Need for family participation in patient care:  Yes (Comment)  Care giving concerns: Patient is a long term resident at [facility].   Social Worker assessment / plan:  CSW informed by physician that patient will return to [facility] and that patient will need to have hospice follow and continue comfort measures. CSW spoke with Sue Lush at [facility] who deferred me to [employer] stated that patient could return with hospice today. Discharge information sent. Patient to transport via EMS. Patient's son had no preference for hospice agency and was provided the hospice medicare list and chose [facility].  CSW has given referral to [facility].  Employment status:    Insurance information:    PT Recommendations:    Information / Referral to community resources:     Patient/Family's Response to care:  Patient's son expressed appreciation for CSW assistance.  Patient/Family's Understanding of and Emotional Response to Diagnosis, Current Treatment, and Prognosis:  Patient's son has come to an acceptance that his mom is nearing end  of life.  Emotional Assessment Appearance:  Appears stated age Attitude/Demeanor/Rapport:  (non verbal) Affect (typically observed):    Orientation:    Alcohol / Substance use:  Not Applicable Psych involvement (Current and /or in the community):  No (Comment)  Discharge Needs  Concerns to be addressed:    Readmission within the last 30 days:  No Current discharge risk:  None Barriers to Discharge:  No Barriers Identified   York Spaniel, LCSW 08/30/2018, 12:18 PM

## 2018-08-31 DIAGNOSIS — E860 Dehydration: Secondary | ICD-10-CM

## 2018-08-31 DIAGNOSIS — F0150 Vascular dementia without behavioral disturbance: Secondary | ICD-10-CM | POA: Diagnosis not present

## 2018-09-30 DEATH — deceased

## 2018-12-01 ENCOUNTER — Encounter (INDEPENDENT_AMBULATORY_CARE_PROVIDER_SITE_OTHER): Payer: Medicare Other

## 2018-12-01 ENCOUNTER — Ambulatory Visit (INDEPENDENT_AMBULATORY_CARE_PROVIDER_SITE_OTHER): Payer: Medicare Other | Admitting: Vascular Surgery
